# Patient Record
Sex: Male | Born: 1950 | Race: White | Hispanic: No | Marital: Married | Smoking: Never smoker
Health system: Southern US, Community
[De-identification: ages and names within clinical notes are randomized; demographics above are authoritative.]

## PROBLEM LIST (undated history)

## (undated) DIAGNOSIS — N201 Calculus of ureter: Secondary | ICD-10-CM

## (undated) DIAGNOSIS — R7301 Impaired fasting glucose: Secondary | ICD-10-CM

## (undated) DIAGNOSIS — J189 Pneumonia, unspecified organism: Secondary | ICD-10-CM

## (undated) DIAGNOSIS — I10 Essential (primary) hypertension: Secondary | ICD-10-CM

## (undated) DIAGNOSIS — Z91018 Allergy to other foods: Secondary | ICD-10-CM

## (undated) DIAGNOSIS — I4819 Other persistent atrial fibrillation: Secondary | ICD-10-CM

## (undated) HISTORY — PX: SHOULDER ARTHROSCOPY W/ ROTATOR CUFF REPAIR: SHX2400

## (undated) HISTORY — DX: Other persistent atrial fibrillation: I48.19

## (undated) HISTORY — DX: Impaired fasting glucose: R73.01

## (undated) HISTORY — PX: FRACTURE SURGERY: SHX138

## (undated) HISTORY — DX: Allergy to other foods: Z91.018

## (undated) HISTORY — PX: ORIF METACARPAL FRACTURE: SUR940

## (undated) HISTORY — DX: Calculus of ureter: N20.1

---

## 2003-06-07 ENCOUNTER — Ambulatory Visit (HOSPITAL_COMMUNITY): Admission: RE | Admit: 2003-06-07 | Discharge: 2003-06-07 | Payer: Self-pay | Admitting: Orthopedic Surgery

## 2003-07-31 ENCOUNTER — Ambulatory Visit (HOSPITAL_BASED_OUTPATIENT_CLINIC_OR_DEPARTMENT_OTHER): Admission: RE | Admit: 2003-07-31 | Discharge: 2003-07-31 | Payer: Self-pay | Admitting: Orthopedic Surgery

## 2003-07-31 ENCOUNTER — Ambulatory Visit (HOSPITAL_COMMUNITY): Admission: RE | Admit: 2003-07-31 | Discharge: 2003-07-31 | Payer: Self-pay | Admitting: Orthopedic Surgery

## 2003-08-08 ENCOUNTER — Ambulatory Visit (HOSPITAL_COMMUNITY): Admission: RE | Admit: 2003-08-08 | Discharge: 2003-08-08 | Payer: Self-pay | Admitting: Orthopedic Surgery

## 2005-10-03 ENCOUNTER — Emergency Department (HOSPITAL_COMMUNITY): Admission: EM | Admit: 2005-10-03 | Discharge: 2005-10-04 | Payer: Self-pay | Admitting: Emergency Medicine

## 2012-07-10 DIAGNOSIS — I4819 Other persistent atrial fibrillation: Secondary | ICD-10-CM

## 2012-07-10 HISTORY — DX: Other persistent atrial fibrillation: I48.19

## 2012-07-12 ENCOUNTER — Other Ambulatory Visit: Payer: Self-pay | Admitting: Cardiology

## 2012-07-14 ENCOUNTER — Ambulatory Visit (HOSPITAL_COMMUNITY): Payer: BC Managed Care – PPO | Admitting: Anesthesiology

## 2012-07-14 ENCOUNTER — Encounter (HOSPITAL_COMMUNITY)
Admission: RE | Disposition: A | Discharge status: Home / Self Care | Payer: Self-pay | Source: Ambulatory Visit | Attending: Cardiology

## 2012-07-14 ENCOUNTER — Encounter (HOSPITAL_COMMUNITY): Payer: Self-pay | Admitting: Anesthesiology

## 2012-07-14 ENCOUNTER — Encounter (HOSPITAL_COMMUNITY): Payer: Self-pay | Admitting: *Deleted

## 2012-07-14 ENCOUNTER — Ambulatory Visit (HOSPITAL_COMMUNITY)
Admission: RE | Admit: 2012-07-14 | Discharge: 2012-07-14 | Disposition: A | Discharge status: Home / Self Care | Payer: BC Managed Care – PPO | Source: Ambulatory Visit | Attending: Cardiology | Admitting: Cardiology

## 2012-07-14 DIAGNOSIS — I10 Essential (primary) hypertension: Secondary | ICD-10-CM | POA: Insufficient documentation

## 2012-07-14 DIAGNOSIS — I4891 Unspecified atrial fibrillation: Secondary | ICD-10-CM | POA: Diagnosis present

## 2012-07-14 HISTORY — DX: Pneumonia, unspecified organism: J18.9

## 2012-07-14 HISTORY — PX: CARDIOVERSION: SHX1299

## 2012-07-14 HISTORY — PX: TEE WITHOUT CARDIOVERSION: SHX5443

## 2012-07-14 HISTORY — DX: Essential (primary) hypertension: I10

## 2012-07-14 SURGERY — ECHOCARDIOGRAM, TRANSESOPHAGEAL
Anesthesia: General

## 2012-07-14 MED ORDER — MIDAZOLAM HCL 10 MG/2ML IJ SOLN
INTRAMUSCULAR | Status: DC | PRN
  Administered 2012-07-14 (×2): 2 mg via INTRAVENOUS
  Administered 2012-07-14: 1 mg via INTRAVENOUS

## 2012-07-14 MED ORDER — BUTAMBEN-TETRACAINE-BENZOCAINE 2-2-14 % EX AERO
INHALATION_SPRAY | CUTANEOUS | Status: DC | PRN
  Administered 2012-07-14: 2 via TOPICAL

## 2012-07-14 MED ORDER — PROPOFOL 10 MG/ML IV BOLUS
INTRAVENOUS | Status: DC | PRN
  Administered 2012-07-14: 70 mg via INTRAVENOUS

## 2012-07-14 MED ORDER — FENTANYL CITRATE 0.05 MG/ML IJ SOLN
INTRAMUSCULAR | Status: AC
  Filled 2012-07-14: qty 2

## 2012-07-14 MED ORDER — FENTANYL CITRATE 0.05 MG/ML IJ SOLN
INTRAMUSCULAR | Status: DC | PRN
  Administered 2012-07-14: 25 ug via INTRAVENOUS
  Administered 2012-07-14: 10 ug via INTRAVENOUS
  Administered 2012-07-14: 25 ug via INTRAVENOUS

## 2012-07-14 MED ORDER — LIDOCAINE HCL (CARDIAC) 20 MG/ML IV SOLN
INTRAVENOUS | Status: DC | PRN
  Administered 2012-07-14: 20 mg via INTRAVENOUS

## 2012-07-14 MED ORDER — LIDOCAINE VISCOUS 2 % MT SOLN
OROMUCOSAL | Status: AC
  Filled 2012-07-14: qty 15

## 2012-07-14 MED ORDER — SODIUM CHLORIDE 0.9 % IV SOLN
INTRAVENOUS | Status: DC
  Administered 2012-07-14: 500 mL via INTRAVENOUS

## 2012-07-14 MED ORDER — MIDAZOLAM HCL 5 MG/ML IJ SOLN
INTRAMUSCULAR | Status: AC
  Filled 2012-07-14: qty 2

## 2012-07-14 NOTE — CV Procedure (Signed)
Electrical Cardioversion Procedure Note Reginald [name] [id number] 1950-08-10  Procedure: Electrical Cardioversion Indications:  Atrial Fibrillation  Time Out: Verified patient identification, verified procedure,medications/allergies/relevent history reviewed, required imaging and test results available.  Performed  Procedure Details  The patient was NPO after midnight. Anesthesia was administered at the beside  by Dr.Joselin with 70mg  of propofol.  Cardioversion was performed with synchronized biphasic defibrillation via AP pads with 150 joules.  1 attempt(s) were performed.  The patient converted to normal sinus rhythm. The patient tolerated the procedure well   TEE  - normal EF  - LA 5.1cm  - moderate MR  - No thrombus  IMPRESSION:  Successful cardioversion of atrial fibrillation Moderate MR Dilated LA 5.1cm Normal EF.  Continue apixiban for 3 weeks. I will see in follow up.    Reginald [name] 07/14/2012, 10:50 AM

## 2012-07-14 NOTE — Anesthesia Preprocedure Evaluation (Addendum)
Anesthesia Evaluation  Patient identified by MRN, date of birth, ID band Patient awake    Reviewed: Allergy & Precautions, H&P , NPO status , Patient's Chart, lab work & pertinent test results, reviewed documented beta blocker date and time   Airway Mallampati: I      Dental  (+) Teeth Intact   Pulmonary asthma ,  breath sounds clear to auscultation        Cardiovascular hypertension, Pt. on medications + dysrhythmias Atrial Fibrillation Rhythm:Irregular     Neuro/Psych    GI/Hepatic   Endo/Other    Renal/GU      Musculoskeletal   Abdominal   Peds  Hematology   Anesthesia Other Findings   Reproductive/Obstetrics                         Anesthesia Physical Anesthesia Plan  ASA: III  Anesthesia Plan: General   Post-op Pain Management:    Induction: Intravenous  Airway Management Planned: Mask  Additional Equipment:   Intra-op Plan:   Post-operative Plan:   Informed Consent:   Dental advisory given  Plan Discussed with: CRNA and Anesthesiologist  Anesthesia Plan Comments:        Anesthesia Quick Evaluation

## 2012-07-14 NOTE — H&P (View-Only) (Signed)
Patient: Reginald [name], Reginald [name] Account Number: [id number] Provider: Donato Schultz, MD  DOB: May 17, 1950 Age: 62 Y Sex: Male Date: 07/13/2012  Phone: [phone]   Address: [address] [place], [id number]  Pcp: JAMES OSBORNE          1. 2 DAY FOLLOWUP/SEE LINDA.        HPI:  General:  62 year old with hyperlipidemia who is being evaluated for new onset atrial fibrillation. Aspirin was started. Metoprolol tartrate 25 mg twice a day was started. Lab work including TSH, d-dimer, CBC were initiated. EKG, personally reviewed demonstrates heart rate of 147, atrial fibrillation, no ST segment changes. Was [place] the beach playing golf figure 8, felt palpitations on hole 8. Felt a little off. Stopped at 10. Was playing with Dr. Hart [place] and Briant Cedar. He palpated and noted irregularity. Last night while laying down felt some mild chest pressure upper chest, mid. No real SOB, no syncope. No fevers, no cough. No thyroid issues. No weight loss. No recent surgery. He did consume alcohol over weekend, more than usual. No stroke history, no DM. No bleeding. He has not felt any further chest discomfort or pressure. His heart rate is still in atrial fibrillation and return clinic visit today in preparation for cardioversion. We had a lengthy discussion today about further testing, stress testing. If his symptoms return or continue/chest tightness that is, we will certainly pursue nuclear stress test. My first step is to perform TEE cardioversion..        ROS:  No bleeding, no syncope, no orthopnea, no PND.       Medical History: Hypertension dx 2007, Impaired fasting glucose dx 2007, Asthmatic bronchitis 2000, Pneumonia 1967, Duputryn's contracture left palm, Ureteral stone - 1980s.        Social History:  General:  History of smoking  cigarettes: Never smoked Alcohol: Daily.  Exercise: some weights; less aerobic exercise.  Occupation: Owns a Electrical engineer company;blue polka dots on truck.   Marital Status: Married.        Medications: Taking MVI as directed Once a day, Taking Vitamin C 1000 MG Tablet as directed Once a day, Taking Glucosamine 500 MG Capsule 1 capsule Three times a day, Taking Diovan HCT 320-25 MG Tablet TAKE ONE TABLET BY MOUTH DAILY FOR BLOOD PRESSURE , Taking Metoprolol Tartrate 25 MG Tablet 1 tablet Twice a day, Taking Eliquis 5 mg 5 Tablet 1 tablet twice a day, Medication List reviewed and reconciled with the patient       Allergies: Lisinopril: cough.           Vitals: Wt 257, Wt change 2 lb, Ht 72, BMI 34.85, Pulse sitting 88, BP sitting 100/70.       Examination:  Cardiology, General:  GENERAL APPEARANCE: pleasant, NAD.  HEENT: unremarkable.  CAROTID UPSTROKE: normal, no bruit.  JVD: flat.  HEART SOUNDS: irregularly irregular, tachycardic.  MURMUR: absent.  LUNGS: no rales or wheezes.  EXTREMITIES: no leg edema.            Assessment:  1. Atrial fibrillation - 427.31 (Primary)        1. Atrial fibrillation  Notes: Continue with current anticoagulation, apixiban.  Planned cardioversion tomorrow. Once again discussed. I will not increase metoprolol to day. May have bradycardia post procedure. blood pressure slightly low. May be secondary to metoprolol.        Follow Up: 1 Week post cardioversion      Provider: Donato Schultz, MD  Patient: Reginald [name], Reginald [name] DOB: [birth year]  Date: 07/13/2012

## 2012-07-14 NOTE — Interval H&P Note (Signed)
History and Physical Interval Note:  07/14/2012 10:03 AM  Reginald [name]  has presented today for surgery, with the diagnosis of a-fib  The various methods of treatment have been discussed with the patient and family. After consideration of risks, benefits and other options for treatment, the patient has consented to  Procedure(s): TRANSESOPHAGEAL ECHOCARDIOGRAM (TEE) (N/A) CARDIOVERSION (N/A) as a surgical intervention .  The patient's history has been reviewed, patient examined, no change in status, stable for surgery.  I have reviewed the patient's chart and labs.  Questions were answered to the patient's satisfaction.     SKAINS, MARK

## 2012-07-14 NOTE — Anesthesia Postprocedure Evaluation (Signed)
  Anesthesia Post-op Note  Patient: Reginald [name]  Procedure(s) Performed: Procedure(s): TRANSESOPHAGEAL ECHOCARDIOGRAM (TEE) (N/A) CARDIOVERSION (N/A)  Patient Location: PACU and Endoscopy Unit  Anesthesia Type:General  Level of Consciousness: awake, alert  and oriented  Airway and Oxygen Therapy: Patient Spontanous Breathing and Patient connected to nasal cannula oxygen  Post-op Pain: none  Post-op Assessment: Post-op Vital signs reviewed, Patient's Cardiovascular Status Stable, Respiratory Function Stable and Patent Airway  Post-op Vital Signs: stable  Complications: No apparent anesthesia complications

## 2012-07-14 NOTE — Preoperative (Signed)
Beta Blockers   Reason not to administer Beta Blockers:Not Applicable 

## 2012-07-14 NOTE — H&P (Signed)
  Patient: Reginald [name] Account Number: [id number] Provider: Breyson Kelm, MD  DOB: 09/11/1950 Age: 62 Y Sex: Male Date: 07/13/2012  Phone: [phone]   Address: [address], [place], [place]  Pcp: JAMES OSBORNE          1. 2 DAY FOLLOWUP/SEE LINDA.        HPI:  General:  62-year-old with hyperlipidemia who is being evaluated for new onset atrial fibrillation. Aspirin was started. Metoprolol tartrate 25 mg twice a day was started. Lab work including TSH, d-dimer, CBC were initiated. EKG, personally reviewed demonstrates heart rate of 147, atrial fibrillation, no ST segment changes. Was at the beach playing golf figure 8, felt palpitations on hole 8. Felt a little off. Stopped at 10. Was playing with Dr. Lawson and [place]. He palpated and noted irregularity. Last night while laying down felt some mild chest pressure upper chest, mid. No real SOB, no syncope. No fevers, no cough. No thyroid issues. No weight loss. No recent surgery. He did consume alcohol over weekend, more than usual. No stroke history, no DM. No bleeding. He has not felt any further chest discomfort or pressure. His heart rate is still in atrial fibrillation and return clinic visit today in preparation for cardioversion. We had a lengthy discussion today about further testing, stress testing. If his symptoms return or continue/chest tightness that is, we will certainly pursue nuclear stress test. My first step is to perform TEE cardioversion..        ROS:  No bleeding, no syncope, no orthopnea, no PND.       Medical History: Hypertension dx 2007, Impaired fasting glucose dx 2007, Asthmatic bronchitis 2000, Pneumonia 1967, Duputryn's contracture left palm, Ureteral stone - 1980s.        Social History:  General:  History of smoking  cigarettes: Never smoked Alcohol: Daily.  Exercise: some weights; less aerobic exercise.  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   Marital Status: Married.        Medications: Taking MVI as directed Once a day, Taking Vitamin C 1000 MG Tablet as directed Once a day, Taking Glucosamine 500 MG Capsule 1 capsule Three times a day, Taking Diovan HCT 320-25 MG Tablet TAKE ONE TABLET BY MOUTH DAILY FOR BLOOD PRESSURE , Taking Metoprolol Tartrate 25 MG Tablet 1 tablet Twice a day, Taking Eliquis 5 mg 5 Tablet 1 tablet twice a day, Medication List reviewed and reconciled with the patient       Allergies: Lisinopril: cough.           Vitals: Wt 257, Wt change 2 lb, Ht 72, BMI 34.85, Pulse sitting 88, BP sitting 100/70.       Examination:  Cardiology, General:  GENERAL APPEARANCE: pleasant, NAD.  HEENT: unremarkable.  CAROTID UPSTROKE: normal, no bruit.  JVD: flat.  HEART SOUNDS: irregularly irregular, tachycardic.  MURMUR: absent.  LUNGS: no rales or wheezes.  EXTREMITIES: no leg edema.            Assessment:  1. Atrial fibrillation - 427.31 (Primary)        1. Atrial fibrillation  Notes: Continue with current anticoagulation, apixiban.  Planned cardioversion tomorrow. Once again discussed. I will not increase metoprolol to day. May have bradycardia post procedure. blood pressure slightly low. May be secondary to metoprolol.        Follow Up: 1 Week post cardioversion      Provider: Yzabelle Calles, MD  Patient: Reginald [name] DOB: 01/29/1951   Date: 07/13/2012       

## 2012-07-14 NOTE — Transfer of Care (Signed)
Immediate Anesthesia Transfer of Care Note  Patient: Reginald [name]  Procedure(s) Performed: Procedure(s): TRANSESOPHAGEAL ECHOCARDIOGRAM (TEE) (N/A) CARDIOVERSION (N/A)  Patient Location: PACU and Endoscopy Unit  Anesthesia Type:General  Level of Consciousness: awake, alert  and oriented  Airway & Oxygen Therapy: Patient Spontanous Breathing and Patient connected to nasal cannula oxygen  Post-op Assessment: Report given to PACU RN, Post -op Vital signs reviewed and stable and Patient moving all extremities X 4  Post vital signs: Reviewed and stable  Complications: No apparent anesthesia complications

## 2012-07-14 NOTE — Progress Notes (Signed)
Echocardiogram Echocardiogram Transesophageal has been performed.  Reginald [name] 07/14/2012, 10:48 AM

## 2012-07-15 ENCOUNTER — Encounter (HOSPITAL_COMMUNITY): Payer: Self-pay | Admitting: Cardiology

## 2012-09-13 ENCOUNTER — Other Ambulatory Visit: Payer: Self-pay | Admitting: Cardiology

## 2012-09-15 NOTE — H&P (Signed)
Patient: Reginald [name], Reginald [name] Account Number: [id number] Provider: Donato Schultz, MD  DOB: 04/02/1951   Age: 62 Y   Sex: Male Date: 09/12/2012  Phone: [phone]  Address: [address] [place], [id number]  Pcp: JAMES OSBORNE    --------------------------------------------------------------------------------       1. MS/walk in/"back in afib".      HPI:     General:           [name] is a 62 year old male with recent TEE cardioversion 07/14/12 after new onset/newly discovered atrial fibrillation who previously had throat tightness/chest tightness, mild shortness of breath here for followup. Quite anxious about his current condition. He is a Occupational hygienist. He is currently wearing a wrist heart rate monitor. He's had several friends who have had recent heart issues. One went to bypass surgery recently.         He was on Metoprolol tartrate 25 mg twice a day which was changed to Dilt 120 CD (possible fatigue with metoprolol). EKG was stable in NSR. (Prior EKG, demonstrates AF with [phone]) TEE cardioversion was successful. TEE with noted Moderate mitral regurgitation, Left atrial dimension of 5.1 cm but TTE with LA size 4.1 and 1.58cm LVH (mod) and mitral regurgitation only appeared mild.                   Was on Eliquis for one month post CV. Has caused him alot of anxiety and he is fearful of reoccurance. He felt an occasion skip beat yesterday, about every 20th beat and he wanted to make sure he was OK, and he had alot of questions on what he could do to help prevent A fib. He is walking 3 miles a day without difficulty nor chest pain or palpitations. Occasionally he will feel mild chest soreness if he bends over or moves like lifting arms over head, but not related to exertion and this is very fleeting.                  On September 12, 2012, unfortunately he is back in atrial fibrillation. He believes that this occurred on Saturday while playing golf on the 10th whole. He felt mild dizziness but no  chest pain or pressure. Mild diaphoresis associated with this. He called on call physician who told him to take an extra diltiazem. He also took Eliquis last evening. We started on Multaq (secondary to LVH) and set him up for a TEE cardioversion Friday..      ROS:      No fevers, no cough, no syncope, no chest pain, shortness of breath.     Medical History: Hypertension dx 2007, Impaired fasting glucose dx 2007, Asthmatic bronchitis 2000, Pneumonia 1967, Duputryn's contracture left palm, Ureteral stone - 1980s.      Medications: Taking MVI 1 tablet Once a day, Taking Vitamin C 1000 MG Tablet as directed Once a day, Taking Glucosamine 500 MG Capsule 1 capsule Three times a day, Taking Dilt-CD 120 MG Capsule Extended Release 24 Hour 1 capsule twice a day, Taking Diovan HCT 320-25 MG Tablet TAKE ONE TABLET BY MOUTH DAILY FOR BLOOD PRESSURE , Taking Eliquis 5 mg Tablet 1 tablet twice a day, Medication List reviewed and reconciled with the patient     Allergies: Lisinopril: cough.        Vitals: Wt 251, Wt change -2 lb, Ht 72, BMI 34.04, Pulse sitting 116, BP sitting 102/70.     Examination:     Cardiology, General:  GENERAL APPEARANCE: pleasant, NAD.         HEENT: unremarkable.         CAROTID UPSTROKE: normal, no bruit.         JVD: flat.         HEART SOUNDS: irregularly irregular, mildly tachycardic.         MURMUR: absent.         LUNGS: no rales or wheezes.         EXTREMITIES: no leg edema.         PERIPHERAL PULSES: 2 plus bilateral.               Assessment:  1. Atrial fibrillation - 427.31 (Primary)   2. Essential hypertension, benign - 401.1   3. Anticoagulant monitoring - V58.61   4. Dizziness - 780.4       1. Atrial fibrillation  Refill Eliquis 5 mg Tablet, 5, 1 tablet, orally, twice a day, 30 days, 60, Refills 12 ;  Start Multaq tablet, 400 mg, 1 tablet, orally, daily, 30 days, 60, Refills 12 .         LAB: PT (Prothrombin Time) ([id number])      LAB: Basic  Metabolic      LAB: CBC with Diff      Imaging: EKG  AFIB RVR, 118            [name],[name] 09/12/2012 09:40:31 AM > SKAINS,MARK 09/12/2012 09:57:42 AM > Prior ECG NSR.   Notes: Discussed possible triggers of atrial fibrillation once again including alcohol, sleep deprivation. Initiating antiarrhythmic. Discussed the possibility of future ablative therapy. resume anticoagulation. Discussed TEE cardioversion. Willing to proceed. Questions answered.       2. Essential hypertension, benign  Notes: Blood pressure currently under good control. No changes made.       3. Anticoagulant monitoring  Notes: Received anticoagulation once again. I have written another prescription. Prior creatinine within normal range.       4. Dizziness  Notes: One of his symptoms with atrial fibrillation is dizziness. When he gets up from a seated position he feels mildly lightheaded.          Procedures:      Venipuncture:          Venipuncture: Gasanova,Svetlana 09/12/2012 11:35:14 AM > , performed in right arm.             Labs:        Lab: PT (Prothrombin Time) ([id number])  normal       Prothrombin Time 11.5  9.1-12.0 - SEC       INR 1.1  0.8-1.2 -                  SKAINS,MARK 09/13/2012 07:15:37 AM >               Lab: Basic Metabolic  Creat 0.94, K 4.2       GLUCOSE 98  70-99 - mg/dL       BUN 14  0-86 - mg/dL       CREATININE 5.78  0.60-1.30 - mg/dl       eGFR (NON-AFRICAN AMERICAN) 81  >60 - calc       eGFR (AFRICAN AMERICAN) 99  >60 - calc       SODIUM 138  136-145 - mmol/L       POTASSIUM 4.2  3.5-5.5 - mmol/L       CHLORIDE 101  98-107 - mmol/L  C02 29  22-32 - mmol/L       ANION GAP 12.0  6.0-20.0 - mmol/L       CALCIUM 9.7  8.6-10.3 - mg/dL                 [facility] 09/12/2012 04:30:44 PM > ok for TEE/CV               Lab: CBC with Diff  hemoglobin 16.8       WBC 8.9  4.0-11.0 - K/ul       RBC 5.42  4.20-5.80 - M/uL       HGB 16.8  13.0-17.0 - g/dL       HCT 56.2  13.0-86.5  - %       MCH 31.0  27.0-33.0 - pg       MPV 8.8  7.5-10.7 - fL       MCV 90.1  80.0-94.0 - fL       MCHC 34.4  32.0-36.0 - g/dL       RDW 78.4  69.6-29.5 - %       NRBC# 0.01  -        PLT 174  150-400 - K/uL       NEUT % 67.0  43.3-71.9 - %       NRBC% 0.10  - %       LYMPH% 21.6  16.8-43.5 - %       MONO % 7.8  4.6-12.4 - %       EOS % 2.6  0.0-7.8 - %       BASO % 1.0  0.0-1.0 - %       NEUT # 6.0  1.9-7.2 - K/uL       LYMPH# 1.90  1.10-2.70 - K/uL       MONO # 0.7  0.3-0.8 - K/uL       EOS # 0.2  0.0-0.6 - K/uL       BASO # 0.1  0.0-0.1 - K/uL                 [facility] 09/12/2012 01:38:57 PM > okay for cardioversion           Procedure Codes: 28413 EKG I AND R, 80048 ECL BMP, 85025 ECL CBC PLATELET DIFF, 24401 BLOOD COLLECTION ROUTINE VENIPUNCTURE     Follow Up: post conversion         Provider: Donato Schultz, MD  Patient: Reginald [name], Reginald [name]  DOB: 06/12/1950  Date: 09/12/2012

## 2012-09-16 ENCOUNTER — Encounter (HOSPITAL_COMMUNITY)
Admission: RE | Disposition: A | Discharge status: Home / Self Care | Payer: Self-pay | Source: Ambulatory Visit | Attending: Cardiology

## 2012-09-16 ENCOUNTER — Ambulatory Visit (HOSPITAL_COMMUNITY)
Admission: RE | Admit: 2012-09-16 | Discharge: 2012-09-16 | Disposition: A | Discharge status: Home / Self Care | Payer: BC Managed Care – PPO | Source: Ambulatory Visit | Attending: Cardiology | Admitting: Cardiology

## 2012-09-16 ENCOUNTER — Encounter (HOSPITAL_COMMUNITY): Payer: Self-pay | Admitting: Certified Registered Nurse Anesthetist

## 2012-09-16 ENCOUNTER — Ambulatory Visit (HOSPITAL_COMMUNITY): Payer: BC Managed Care – PPO | Admitting: Certified Registered Nurse Anesthetist

## 2012-09-16 ENCOUNTER — Encounter (HOSPITAL_COMMUNITY): Payer: Self-pay | Admitting: *Deleted

## 2012-09-16 DIAGNOSIS — Z79899 Other long term (current) drug therapy: Secondary | ICD-10-CM | POA: Insufficient documentation

## 2012-09-16 DIAGNOSIS — R7301 Impaired fasting glucose: Secondary | ICD-10-CM | POA: Insufficient documentation

## 2012-09-16 DIAGNOSIS — I517 Cardiomegaly: Secondary | ICD-10-CM | POA: Insufficient documentation

## 2012-09-16 DIAGNOSIS — Z7901 Long term (current) use of anticoagulants: Secondary | ICD-10-CM | POA: Insufficient documentation

## 2012-09-16 DIAGNOSIS — F411 Generalized anxiety disorder: Secondary | ICD-10-CM | POA: Insufficient documentation

## 2012-09-16 DIAGNOSIS — I10 Essential (primary) hypertension: Secondary | ICD-10-CM | POA: Insufficient documentation

## 2012-09-16 DIAGNOSIS — I4891 Unspecified atrial fibrillation: Secondary | ICD-10-CM | POA: Diagnosis present

## 2012-09-16 DIAGNOSIS — J45909 Unspecified asthma, uncomplicated: Secondary | ICD-10-CM | POA: Insufficient documentation

## 2012-09-16 DIAGNOSIS — I059 Rheumatic mitral valve disease, unspecified: Secondary | ICD-10-CM | POA: Insufficient documentation

## 2012-09-16 HISTORY — PX: TEE WITHOUT CARDIOVERSION: SHX5443

## 2012-09-16 HISTORY — PX: CARDIOVERSION: SHX1299

## 2012-09-16 SURGERY — ECHOCARDIOGRAM, TRANSESOPHAGEAL
Anesthesia: General

## 2012-09-16 MED ORDER — LIDOCAINE VISCOUS 2 % MT SOLN
OROMUCOSAL | Status: AC
  Filled 2012-09-16: qty 15

## 2012-09-16 MED ORDER — FENTANYL CITRATE 0.05 MG/ML IJ SOLN
INTRAMUSCULAR | Status: AC
  Filled 2012-09-16: qty 4

## 2012-09-16 MED ORDER — HYDROCORTISONE 1 % EX CREA
1.0000 "application " | TOPICAL_CREAM | Freq: Two times a day (BID) | CUTANEOUS | Status: DC
  Filled 2012-09-16: qty 28

## 2012-09-16 MED ORDER — FENTANYL CITRATE 0.05 MG/ML IJ SOLN
INTRAMUSCULAR | Status: DC | PRN
  Administered 2012-09-16 (×2): 25 ug via INTRAVENOUS

## 2012-09-16 MED ORDER — MIDAZOLAM HCL 5 MG/ML IJ SOLN
INTRAMUSCULAR | Status: AC
  Filled 2012-09-16: qty 2

## 2012-09-16 MED ORDER — MIDAZOLAM HCL 10 MG/2ML IJ SOLN
INTRAMUSCULAR | Status: DC | PRN
  Administered 2012-09-16 (×2): 2 mg via INTRAVENOUS

## 2012-09-16 MED ORDER — PROPOFOL 10 MG/ML IV BOLUS
INTRAVENOUS | Status: DC | PRN
  Administered 2012-09-16: 60 mg via INTRAVENOUS

## 2012-09-16 MED ORDER — DIPHENHYDRAMINE HCL 50 MG/ML IJ SOLN
INTRAMUSCULAR | Status: AC
  Filled 2012-09-16: qty 1

## 2012-09-16 MED ORDER — SODIUM CHLORIDE 0.9 % IV SOLN
INTRAVENOUS | Status: DC
  Administered 2012-09-16: 500 mL via INTRAVENOUS

## 2012-09-16 MED ORDER — HYDROCORTISONE 1 % EX CREA: 1.0000 "application " | TOPICAL_CREAM | Freq: Two times a day (BID) | CUTANEOUS | Status: AC

## 2012-09-16 MED ORDER — LIDOCAINE VISCOUS 2 % MT SOLN
OROMUCOSAL | Status: DC | PRN
  Administered 2012-09-16: 10 mL via OROMUCOSAL

## 2012-09-16 NOTE — CV Procedure (Signed)
Electrical Cardioversion Procedure Note AZAD CALAME [id number] 1951-03-03  Procedure: Electrical Cardioversion Indications:  AFIB  Time Out: Verified patient identification, verified procedure,medications/allergies/relevent history reviewed, required imaging and test results available.  TIME OUT performed.   Procedure Details  The patient was NPO after midnight. Anesthesia (Dr. Katrinka Blazing) was administered at the beside 60mg  of propofol.  Cardioversion was performed with synchronized biphasic defibrillation via AP pads with 150 joules.  1 attempt(s) were performed.  The patient converted to normal sinus rhythm. The patient tolerated the procedure well   IMPRESSION:  Successful cardioversion of atrial fibrillation. Mild chest wall burn noted post conversion.  TEE with mild to moderate MR.  Normal EF No LAA thrombus  This is his second cardioversion. Eliquis 5mg  BID. Multaq 400mg  BID.   Will provide cortisone cream to chest wall.   Will see in follow up.   Khamora Karan 09/16/2012, 9:09 AM

## 2012-09-16 NOTE — Preoperative (Signed)
Beta Blockers   Reason not to administer Beta Blockers:Not Applicable, Pt took Metoprolol this am 

## 2012-09-16 NOTE — Interval H&P Note (Signed)
History and Physical Interval Note:  09/16/2012 8:43 AM  Reginald [name]  has presented today for surgery, with the diagnosis of a fib   The various methods of treatment have been discussed with the patient and family. After consideration of risks, benefits and other options for treatment, the patient has consented to  Procedure(s): TRANSESOPHAGEAL ECHOCARDIOGRAM (TEE) (N/A) CARDIOVERSION (N/A) as a surgical intervention .  The patient's history has been reviewed, patient examined, no change in status, stable for surgery.  I have reviewed the patient's chart and labs.  Questions were answered to the patient's satisfaction.     Larene Ascencio

## 2012-09-16 NOTE — H&P (View-Only) (Signed)
  Patient: Reginald [name], Reginald [name] Account Number: [id number] Provider: Fines Kimberlin, MD  DOB: 03/09/1951   Age: 61 Y   Sex: Male Date: 09/12/2012  Phone: [phone]  Address: [address], [place], [place]  Pcp: JAMES OSBORNE    --------------------------------------------------------------------------------       1. MS/walk in/"back in afib".      HPI:     General:           Reginald [name] Reginald [name] is a 61-year-old male with recent TEE cardioversion 07/14/12 after new onset/newly discovered atrial fibrillation who previously had throat tightness/chest tightness, mild shortness of breath here for followup. Quite anxious about his current condition. 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 He's had several friends who have had recent heart issues. One went to bypass surgery recently.         He was on Metoprolol tartrate 25 mg twice a Reginald which was changed to Dilt 120 CD (possible fatigue with metoprolol). EKG was stable in NSR. (Prior EKG, demonstrates AF with rate147) TEE cardioversion was successful. TEE with noted Moderate mitral regurgitation, Left atrial dimension of 5.1 cm but TTE with LA size 4.1 and 1.58cm LVH (mod) and mitral regurgitation only appeared mild.                   Was on Eliquis for one month post CV. Has caused him alot of anxiety and he is fearful of reoccurance. He felt an occasion skip beat yesterday, about every 20th beat and he wanted to make sure he was OK, and he had alot of questions on what he could do to help prevent A fib. He is walking 3 miles a Reginald without difficulty nor chest pain or palpitations. Occasionally he will feel mild chest soreness if he bends over or moves like lifting arms over head, but not related to exertion and this is very fleeting.                  On September 12, 2012, unfortunately he is back in atrial fibrillation. He believes that this occurred on Saturday while playing golf on the 10th whole. He felt mild dizziness but no  chest pain or pressure. Mild diaphoresis associated with this. He called on call physician who told him to take an extra diltiazem. He also took Eliquis last evening. We started on Multaq (secondary to LVH) and set him up for a TEE cardioversion Friday..      ROS:      No fevers, no cough, no syncope, no chest pain, shortness of breath.     Medical History: Hypertension dx 2007, Impaired fasting glucose dx 2007, Asthmatic bronchitis 2000, Pneumonia 1967, Duputryn's contracture left palm, Ureteral stone - 1980s.      Medications: Taking MVI 1 tablet Once a Reginald, Taking Vitamin C 1000 MG Tablet as directed Once a Reginald, Taking Glucosamine 500 MG Capsule 1 capsule Three times a Reginald, Taking Dilt-CD 120 MG Capsule Extended Release 24 Hour 1 capsule twice a Reginald, Taking Diovan HCT 320-25 MG Tablet TAKE ONE TABLET BY MOUTH DAILY FOR BLOOD PRESSURE , Taking Eliquis 5 mg Tablet 1 tablet twice a Reginald, Medication List reviewed and reconciled with the patient     Allergies: Lisinopril: cough.        Vitals: Wt 251, Wt change -2 lb, Ht 72, BMI 34.04, Pulse sitting 116, BP sitting 102/70.     Examination:     Cardiology, General:           GENERAL APPEARANCE: pleasant, NAD.         HEENT: unremarkable.         CAROTID UPSTROKE: normal, no bruit.         JVD: flat.         HEART SOUNDS: irregularly irregular, mildly tachycardic.         MURMUR: absent.         LUNGS: no rales or wheezes.         EXTREMITIES: no leg edema.         PERIPHERAL PULSES: 2 plus bilateral.               Assessment:  1. Atrial fibrillation - 427.31 (Primary)   2. Essential hypertension, benign - 401.1   3. Anticoagulant monitoring - V58.61   4. Dizziness - 780.4       1. Atrial fibrillation  Refill Eliquis 5 mg Tablet, 5, 1 tablet, orally, twice a Reginald, 30 days, 60, Refills 12 ;  Start Multaq tablet, 400 mg, 1 tablet, orally, daily, 30 days, 60, Refills 12 .         LAB: PT (Prothrombin Time) (005199)      LAB: Basic  Metabolic      LAB: CBC with Diff      Imaging: EKG  AFIB RVR, 118            Reginald [name],Reginald [name] 09/12/2012 09:40:31 AM > Reginald [name] 09/12/2012 09:57:42 AM > Prior ECG NSR.   Notes: Discussed possible triggers of atrial fibrillation once again including alcohol, sleep deprivation. Initiating antiarrhythmic. Discussed the possibility of future ablative therapy. resume anticoagulation. Discussed TEE cardioversion. Willing to proceed. Questions answered.       2. Essential hypertension, benign  Notes: Blood pressure currently under good control. No changes made.       3. Anticoagulant monitoring  Notes: Received anticoagulation once again. I have written another prescription. Prior creatinine within normal range.       4. Dizziness  Notes: One of his symptoms with atrial fibrillation is dizziness. When he gets up from a seated position he feels mildly lightheaded.          Procedures:      Venipuncture:          Venipuncture: Reginald [name],Reginald [name] 09/12/2012 11:35:14 AM > , performed in right arm.             Labs:        Lab: PT (Prothrombin Time) (005199)  normal       Prothrombin Time 11.5  9.1-12.0 - SEC       INR 1.1  0.8-1.2 -                  Reginald [name] 09/13/2012 07:15:37 AM >               Lab: Basic Metabolic  Creat 0.94, K 4.2       GLUCOSE 98  70-99 - mg/dL       BUN 14  6-26 - mg/dL       CREATININE 0.94  0.60-1.30 - mg/dl       eGFR (NON-AFRICAN AMERICAN) 81  >60 - calc       eGFR (AFRICAN AMERICAN) 99  >60 - calc       SODIUM 138  136-145 - mmol/[name]       POTASSIUM 4.2  3.5-5.5 - mmol/[name]       CHLORIDE 101  98-107 - mmol/[name]         C02 29  22-32 - mmol/[name]       ANION GAP 12.0  6.0-20.0 - mmol/[name]       CALCIUM 9.7  8.6-10.3 - mg/dL                 Reginald [name] 09/12/2012 04:30:44 PM > ok for TEE/CV               Lab: CBC with Diff  hemoglobin 16.8       WBC 8.9  4.0-11.0 - K/ul       RBC 5.42  4.20-5.80 - M/uL       HGB 16.8  13.0-17.0 - g/dL       HCT 48.8  39.0-52.0  - %       MCH 31.0  27.0-33.0 - pg       MPV 8.8  7.5-10.7 - fL       MCV 90.1  80.0-94.0 - fL       MCHC 34.4  32.0-36.0 - g/dL       RDW 13.0  11.5-15.5 - %       NRBC# 0.01  -        PLT 174  150-400 - K/uL       NEUT % 67.0  43.3-71.9 - %       NRBC% 0.10  - %       LYMPH% 21.6  16.8-43.5 - %       MONO % 7.8  4.6-12.4 - %       EOS % 2.6  0.0-7.8 - %       BASO % 1.0  0.0-1.0 - %       NEUT # 6.0  1.9-7.2 - K/uL       LYMPH# 1.90  1.10-2.70 - K/uL       MONO # 0.7  0.3-0.8 - K/uL       EOS # 0.2  0.0-0.6 - K/uL       BASO # 0.1  0.0-0.1 - K/uL                 Reginald [name] 09/12/2012 01:38:57 PM > okay for cardioversion           Procedure Codes: 93000 EKG I AND R, 80048 ECL BMP, 85025 ECL CBC PLATELET DIFF, 36415 BLOOD COLLECTION ROUTINE VENIPUNCTURE     Follow Up: post conversion         Provider: Tyneka Scafidi, MD  Patient: Santosuosso, Mattox [name]  DOB: 06/13/1950  Date: 09/12/2012     

## 2012-09-16 NOTE — Progress Notes (Signed)
  Echocardiogram Echocardiogram Transesophageal has been performed.  Reginald [name] 09/16/2012, 9:12 AM

## 2012-09-16 NOTE — Anesthesia Preprocedure Evaluation (Addendum)
Anesthesia Evaluation  Patient identified by MRN, date of birth, ID band Patient awake    Reviewed: Allergy & Precautions, H&P , NPO status , Patient's Chart, lab work & pertinent test results  Airway Mallampati: II TM Distance: >3 FB Neck ROM: Full    Dental  (+) Dental Advisory Given   Pulmonary asthma , pneumonia -, resolved,          Cardiovascular hypertension, Pt. on medications and Pt. on home beta blockers + dysrhythmias Atrial Fibrillation     Neuro/Psych    GI/Hepatic   Endo/Other    Renal/GU      Musculoskeletal   Abdominal   Peds  Hematology   Anesthesia Other Findings   Reproductive/Obstetrics                          Anesthesia Physical Anesthesia Plan  ASA: III  Anesthesia Plan: General   Post-op Pain Management:    Induction: Intravenous  Airway Management Planned: Mask  Additional Equipment:   Intra-op Plan:   Post-operative Plan:   Informed Consent: I have reviewed the patients History and Physical, chart, labs and discussed the procedure including the risks, benefits and alternatives for the proposed anesthesia with the patient or authorized representative who has indicated his/her understanding and acceptance.   Dental advisory given  Plan Discussed with: CRNA, Anesthesiologist and Surgeon  Anesthesia Plan Comments:        Anesthesia Quick Evaluation

## 2012-09-16 NOTE — Anesthesia Postprocedure Evaluation (Signed)
  Anesthesia Post-op Note  Patient: Reginald [name]  Procedure(s) Performed: Procedure(s): TRANSESOPHAGEAL ECHOCARDIOGRAM (TEE) (N/A) CARDIOVERSION (N/A)  Patient Location: Endoscopy Unit  Anesthesia Type:General  Level of Consciousness: awake, alert , oriented and patient cooperative  Airway and Oxygen Therapy: Patient Spontanous Breathing and Patient connected to nasal cannula oxygen  Post-op Pain: none  Post-op Assessment: Post-op Vital signs reviewed, Patient's Cardiovascular Status Stable, Respiratory Function Stable, Patent Airway, No signs of Nausea or vomiting, Pain level controlled, No headache, No backache, No residual numbness and No residual motor weakness  Post-op Vital Signs: Reviewed and stable  Complications: No apparent anesthesia complications

## 2012-09-16 NOTE — Transfer of Care (Signed)
Immediate Anesthesia Transfer of Care Note  Patient: Reginald [name]  Procedure(s) Performed: Procedure(s): TRANSESOPHAGEAL ECHOCARDIOGRAM (TEE) (N/A) CARDIOVERSION (N/A)  Patient Location: Endoscopy Unit  Anesthesia Type:General  Level of Consciousness: awake, alert , oriented and patient cooperative  Airway & Oxygen Therapy: Patient Spontanous Breathing and Patient connected to nasal cannula oxygen  Post-op Assessment: Report given to PACU RN, Post -op Vital signs reviewed and stable and Patient moving all extremities X 4  Post vital signs: Reviewed and stable  Complications: No apparent anesthesia complications

## 2012-09-18 ENCOUNTER — Encounter (HOSPITAL_COMMUNITY): Payer: Self-pay | Admitting: Cardiology

## 2013-01-02 ENCOUNTER — Other Ambulatory Visit: Payer: Self-pay | Admitting: Gastroenterology

## 2013-01-12 ENCOUNTER — Encounter (HOSPITAL_COMMUNITY): Payer: Self-pay | Admitting: Pharmacy Technician

## 2013-01-14 ENCOUNTER — Encounter: Payer: Self-pay | Admitting: Cardiology

## 2013-01-17 ENCOUNTER — Ambulatory Visit (INDEPENDENT_AMBULATORY_CARE_PROVIDER_SITE_OTHER): Payer: BC Managed Care – PPO | Admitting: Cardiology

## 2013-01-17 ENCOUNTER — Encounter: Payer: Self-pay | Admitting: Cardiology

## 2013-01-17 VITALS — BP 129/84 | HR 72 | Ht 72.0 in | Wt 256.0 lb

## 2013-01-17 DIAGNOSIS — I10 Essential (primary) hypertension: Secondary | ICD-10-CM

## 2013-01-17 DIAGNOSIS — E669 Obesity, unspecified: Secondary | ICD-10-CM

## 2013-01-17 DIAGNOSIS — R400 Somnolence: Secondary | ICD-10-CM

## 2013-01-17 DIAGNOSIS — G4710 Hypersomnia, unspecified: Secondary | ICD-10-CM

## 2013-01-17 DIAGNOSIS — R0609 Other forms of dyspnea: Secondary | ICD-10-CM

## 2013-01-17 DIAGNOSIS — I4891 Unspecified atrial fibrillation: Secondary | ICD-10-CM

## 2013-01-17 DIAGNOSIS — R0683 Snoring: Secondary | ICD-10-CM

## 2013-01-17 NOTE — Progress Notes (Signed)
[address] [place], [place]  [address] Phone: [phone] Fax:  [phone]  Date:  01/17/2013   ID:  Reginald [name], DOB [birth year], MRN [id number]  PCP:  Darnelle Bos, MD   History of Present Illness: Reginald [name] is a 62 y.o. male panel with atrial fibrillation, paroxysmal, chest tightness, pilot here for followup. Anxiety surrounding atrial fibrillation. Had TEE cardioversion on 07/14/12 but unfortunately on 09/12/2012 was back in atrial fibrillation. He felt a playing golf. Mild dizziness. He underwent cardioversion once again. Minor chest burn during cardioversion noted. Atrial size 4.1 cm with moderate LVH on transthoracic echocardiogram with mild mitral regurgitation.  [employer].Moderate ETOH use. Atrial fibrillation returned on the morning of 01/07/13 while out on the practice range. He was able to play golf all day. At approximately 9 PM while sitting outside talking, he noticed conversion back to sinus rhythm. Felt fluttery during atrial fibrillation, no chest pain. Prior nuclear stress test low risk but no ischemia.  Has not had a sleep study. Sometimes wakes up at 230am thinking about work. +Snore. Occasional daytime somnolence.     Wt Readings from Last 3 Encounters:  01/17/13 256 lb (116.121 kg)  09/16/12 245 lb (111.131 kg)  09/16/12 245 lb (111.131 kg)     Past Medical History  Diagnosis Date  . Hypertension   . Pneumonia   . Dysrhythmia 07/10/2012    atrial fibrillation  . Impaired fasting glucose   . Bronchitis, asthmatic   . Contracture of joint of left hand   . Ureteral stone     Past Surgical History  Procedure Laterality Date  . Rotator cuff repair    . Tee without cardioversion N/A 07/14/2012    Procedure: TRANSESOPHAGEAL ECHOCARDIOGRAM (TEE);  Surgeon: Donato Schultz, MD;  Location: [facility] ENDOSCOPY;  Service: Cardiovascular;  Laterality: N/A;  . Cardioversion N/A 07/14/2012    Procedure: CARDIOVERSION;   Surgeon: Donato Schultz, MD;  Location[facility] ENDOSCOPY;  Service: Cardiovascular;  Laterality: N/A;  . Tee without cardioversion N/A 09/16/2012    Procedure: TRANSESOPHAGEAL ECHOCARDIOGRAM (TEE);  Surgeon: Donato Schultz, MD;  Location: [facility] ENDOSCOPY;  Service: Cardiovascular;  Laterality: N/A;  . Cardioversion N/A 09/16/2012    Procedure: CARDIOVERSION;  Surgeon: Donato Schultz, MD;  Location: [facility] ENDOSCOPY;  Service: Cardiovascular;  Laterality: N/A;  . Orif metacarpal fracture Right     Current Outpatient Prescriptions  Medication Sig Dispense Refill  . apixaban (ELIQUIS) 5 MG TABS tablet Take 5 mg by mouth 2 (two) times daily.      Marland Kitchen aspirin 81 MG tablet Take 81 mg by mouth daily.      Marland Kitchen diltiazem (CARDIZEM CD) 120 MG 24 hr capsule Take 120 mg by mouth every morning.      . dronedarone (MULTAQ) 400 MG tablet Take 400 mg by mouth 2 (two) times daily with a meal.      . Glucosamine-Chondroit-Vit C-Mn (GLUCOSAMINE 1500 COMPLEX) CAPS Take 1 capsule by mouth daily.      . Magnesium 250 MG TABS Take 250 mg by mouth daily.      . Multiple Vitamin (MULTIVITAMIN WITH MINERALS) TABS tablet Take 1 tablet by mouth daily.      . potassium gluconate 595 MG TABS tablet Take 595 mg by mouth daily.      . valsartan-hydrochlorothiazide (DIOVAN-HCT) 320-25 MG per tablet Take 1 tablet by mouth every morning.        No current facility-administered medications for this  visit.    Allergies:    Allergies  Allergen Reactions  . Lisinopril Cough    Social History:  The patient  reports that he has never smoked. He does not have any smokeless tobacco history on file. He reports that he drinks about 1.8 ounces of alcohol per week. He reports that he does not use illicit drugs.   ROS:  Please see the history of present illness.   No bleeding, no chest pain, no CVA. Moderate ETOH use.    All other systems reviewed and negative.   PHYSICAL EXAM: VS:  BP 129/84  Pulse 72  Ht 6' (1.829 m)  Wt 256 lb (116.121 kg)  BMI 34.71  kg/m2 Well nourished, well developed, in no acute distress HEENT: normal Neck: no JVD Cardiac:  normal S1, S2; RRR; soft systolic murmur left lower sternal border Lungs:  clear to auscultation bilaterally, no wheezing, rhonchi or rales Abd: soft, nontender, no hepatomegaly, overweight Ext: no edema Skin: warm and dry Neuro: no focal abnormalities noted  Nuclear stress test 08/30/12-no ischemia, normal EF, low risk  Echocardiogram 08/30/12-normal ejection fraction, mild LVH, mild left atrial enlargement, 4.1 cm. Mild mitral regurgitation.  ASSESSMENT AND PLAN:  1. Atrial fibrillation - paroxysmal. Moderate alcohol use likely trigger. Possible sleep apnea, snoring. I have ordered sleep study. I would also like him to sit down with Dr. Hillis Range to discuss his atrial fibrillation. I discussed the possibility of ablation. For now, continue with current antiarrhythmic therapy. It is encouraging that he was able to convert back to sinus rhythm in less than 24 hours. 2. Chronic anticoagulation-for now, we will continue with Eliquis. CHADS-VAS is 1 at the least for hypertension. With the paroxysmal nature of his atrial fibrillation and the possibility of further intervention, I like to continue with his anticoagulation at this time. 3. Obesity-encourage weight loss, exercise 4. Hypertension-currently under good control. No changes made in medications. 5. I will follow up with sleep study as well as Dr. Johney Frame consultation. I will set him up for 6 month followup with me.  Signed, Donato Schultz, MD [facility]  01/17/2013 11:30 AM

## 2013-01-17 NOTE — Patient Instructions (Addendum)
Your physician recommends that you continue on your current medications as directed. Please refer to the Current Medication list given to you today.  Your physician has recommended that you have a sleep study. This test records several body functions during sleep, including: brain activity, eye movement, oxygen and carbon dioxide blood levels, heart rate and rhythm, breathing rate and rhythm, the flow of air through your mouth and nose, snoring, body muscle movements, and chest and belly movement.  You have been referred to Dr. Johney Frame to discuss Atrial Fib.  Your physician wants you to follow-up in: 6 months with Dr. Anne Fu. You will receive a reminder letter in the mail two months in advance. If you don't receive a letter, please call our office to schedule the follow-up appointment.

## 2013-01-25 ENCOUNTER — Encounter (HOSPITAL_COMMUNITY): Payer: Self-pay | Admitting: *Deleted

## 2013-01-31 ENCOUNTER — Encounter (HOSPITAL_COMMUNITY): Payer: BC Managed Care – PPO | Admitting: Anesthesiology

## 2013-01-31 ENCOUNTER — Encounter (HOSPITAL_COMMUNITY)
Admission: RE | Disposition: A | Discharge status: Home / Self Care | Payer: Self-pay | Source: Ambulatory Visit | Attending: Gastroenterology

## 2013-01-31 ENCOUNTER — Encounter (HOSPITAL_COMMUNITY): Payer: Self-pay | Admitting: *Deleted

## 2013-01-31 ENCOUNTER — Ambulatory Visit (HOSPITAL_COMMUNITY)
Admission: RE | Admit: 2013-01-31 | Discharge: 2013-01-31 | Disposition: A | Discharge status: Home / Self Care | Payer: BC Managed Care – PPO | Source: Ambulatory Visit | Attending: Gastroenterology | Admitting: Gastroenterology

## 2013-01-31 ENCOUNTER — Ambulatory Visit (HOSPITAL_COMMUNITY): Payer: BC Managed Care – PPO | Admitting: Anesthesiology

## 2013-01-31 DIAGNOSIS — D126 Benign neoplasm of colon, unspecified: Secondary | ICD-10-CM | POA: Insufficient documentation

## 2013-01-31 DIAGNOSIS — K5730 Diverticulosis of large intestine without perforation or abscess without bleeding: Secondary | ICD-10-CM | POA: Insufficient documentation

## 2013-01-31 DIAGNOSIS — I10 Essential (primary) hypertension: Secondary | ICD-10-CM | POA: Insufficient documentation

## 2013-01-31 DIAGNOSIS — D214 Benign neoplasm of connective and other soft tissue of abdomen: Secondary | ICD-10-CM | POA: Insufficient documentation

## 2013-01-31 DIAGNOSIS — I4891 Unspecified atrial fibrillation: Secondary | ICD-10-CM | POA: Insufficient documentation

## 2013-01-31 DIAGNOSIS — D371 Neoplasm of uncertain behavior of stomach: Secondary | ICD-10-CM | POA: Insufficient documentation

## 2013-01-31 DIAGNOSIS — Z1211 Encounter for screening for malignant neoplasm of colon: Secondary | ICD-10-CM | POA: Insufficient documentation

## 2013-01-31 HISTORY — PX: COLONOSCOPY WITH PROPOFOL: SHX5780

## 2013-01-31 SURGERY — COLONOSCOPY WITH PROPOFOL
Anesthesia: Monitor Anesthesia Care

## 2013-01-31 MED ORDER — PROPOFOL 10 MG/ML IV BOLUS
INTRAVENOUS | Status: DC | PRN
  Administered 2013-01-31: 30 mg via INTRAVENOUS
  Administered 2013-01-31: 20 mg via INTRAVENOUS

## 2013-01-31 MED ORDER — SODIUM CHLORIDE 0.9 % IV SOLN
INTRAVENOUS | Status: DC
  Administered 2013-01-31: 1000 mL via INTRAVENOUS

## 2013-01-31 MED ORDER — PROPOFOL INFUSION 10 MG/ML OPTIME
INTRAVENOUS | Status: DC | PRN
  Administered 2013-01-31: 140 ug/kg/min via INTRAVENOUS

## 2013-01-31 MED ORDER — LACTATED RINGERS IV SOLN: INTRAVENOUS | Status: DC

## 2013-01-31 MED ORDER — LACTATED RINGERS IV SOLN
INTRAVENOUS | Status: DC | PRN
  Administered 2013-01-31 (×2): via INTRAVENOUS

## 2013-01-31 MED ORDER — ONDANSETRON HCL 4 MG/2ML IJ SOLN
INTRAMUSCULAR | Status: DC | PRN
  Administered 2013-01-31: 4 mg via INTRAVENOUS

## 2013-01-31 MED ORDER — KETAMINE HCL 10 MG/ML IJ SOLN
INTRAMUSCULAR | Status: DC | PRN
  Administered 2013-01-31: 25 mg via INTRAVENOUS

## 2013-01-31 MED ORDER — MIDAZOLAM HCL 5 MG/5ML IJ SOLN
INTRAMUSCULAR | Status: DC | PRN
  Administered 2013-01-31 (×2): 1 mg via INTRAVENOUS

## 2013-01-31 SURGICAL SUPPLY — 22 items

## 2013-01-31 NOTE — Preoperative (Signed)
Beta Blockers   Reason not to administer Beta Blockers:Not Applicable, not on home BB 

## 2013-01-31 NOTE — Op Note (Signed)
Procedure: Screening colonoscopy  Endoscopist: Danise Edge  Premedication: Propofol administered by anesthesia  Procedure: The patient was placed in the left lateral decubitus position. Anal inspection and digital rectal exam were normal. The Pentax pediatric colonoscope was introduced into the rectum and advanced to the cecum. A normal-appearing ileocecal valve and appendiceal orifice were identified. Colonic preparation for the exam today was good.  Rectum. Normal. Retroflexed view of the distal rectum normal.  Sigmoid colon and descending colon. Left colonic diverticulosis. At 70 cm from the anal verge a 5 mm sessile polyp was removed with the cold snare and submitted for pathological interpretation.  Splenic flexure. Normal.  Transverse colon. From the mid transverse colon, a 5 mm sessile polyp was removed with the cold snare and submitted for pathological interpretation.  Hepatic flexure. Normal.  Ascending colon. Normal.  Cecum and ileocecal valve. Normal.  Assessment:  #1. From the mid transverse colon, a 5 mm sessile polyp was removed with the cold snare and from the proximal descending colon, a 5 mm sessile polyp was removed with cold snare. Both polyps were submitted in one pathological evaluation .  #2. Left colonic diverticulosis  Recommendations: If the polyps return adenomatous pathologically, the patient should undergo a surveillance colonoscopy in 5 years. If the polyps return nonneoplastic pathologically, the patient should undergo a repeat screening colonoscopy in 10 years.

## 2013-01-31 NOTE — H&P (Signed)
  Procedure: Screening colonoscopy  History: The patient is a 62 year old male born 29-Nov-1950. On 10/11/2002 the patient underwent a normal screening colonoscopy using propofol sedation.  The patient is scheduled to undergo a repeat screening colonoscopy today.  The patient has a history of paroxysmal atrial fibrillation and takes eliquis.  Past medical history: Hypertension. Asthmatic bronchitis. Atrial fibrillation. 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 Left shoulder surgery. Right shoulder surgery.  Allergies: Lisinopril causes cough  Exam: The patient is alert and lying comfortably on the endoscopy stretcher. Abdomen is soft and nontender to palpation. Lungs are clear to auscultation. Cardiac exam reveals a regular rhythm.  Plan: Proceed with screening colonoscopy

## 2013-01-31 NOTE — Transfer of Care (Signed)
Immediate Anesthesia Transfer of Care Note  Patient: Reginald [name]  Procedure(s) Performed: Procedure(s): COLONOSCOPY WITH PROPOFOL (N/A)  Patient Location: PACU  Anesthesia Type:MAC  Level of Consciousness: Patient easily awoken, sedated, comfortable, cooperative, following commands, responds to stimulation.   Airway & Oxygen Therapy: Patient spontaneously breathing, ventilating well, oxygen via simple oxygen mask.  Post-op Assessment: Report given to PACU RN, vital signs reviewed and stable, moving all extremities.   Post vital signs: Reviewed and stable.  Complications: No apparent anesthesia complications

## 2013-01-31 NOTE — Anesthesia Preprocedure Evaluation (Addendum)
Anesthesia Evaluation  Patient identified by MRN, date of birth, ID band Patient awake    Reviewed: Allergy & Precautions, H&P , NPO status , Patient's Chart, lab work & pertinent test results  Airway Mallampati: II TM Distance: >3 FB Neck ROM: Full    Dental no notable dental hx.    Pulmonary asthma ,  breath sounds clear to auscultation  Pulmonary exam normal       Cardiovascular hypertension, Pt. on medications + dysrhythmias Atrial Fibrillation Rhythm:Regular Rate:Normal     Neuro/Psych negative neurological ROS  negative psych ROS   GI/Hepatic negative GI ROS, Neg liver ROS,   Endo/Other  negative endocrine ROS  Renal/GU negative Renal ROS  negative genitourinary   Musculoskeletal negative musculoskeletal ROS (+)   Abdominal   Peds negative pediatric ROS (+)  Hematology negative hematology ROS (+)   Anesthesia Other Findings   Reproductive/Obstetrics negative OB ROS                          Anesthesia Physical Anesthesia Plan  ASA: II  Anesthesia Plan: MAC   Post-op Pain Management:    Induction:   Airway Management Planned: Simple Face Mask  Additional Equipment:   Intra-op Plan:   Post-operative Plan:   Informed Consent: I have reviewed the patients History and Physical, chart, labs and discussed the procedure including the risks, benefits and alternatives for the proposed anesthesia with the patient or authorized representative who has indicated his/her understanding and acceptance.   Dental advisory given  Plan Discussed with: CRNA  Anesthesia Plan Comments:         Anesthesia Quick Evaluation

## 2013-01-31 NOTE — Anesthesia Postprocedure Evaluation (Signed)
  Anesthesia Post-op Note  Patient: Reginald [name]  Procedure(s) Performed: Procedure(s) (LRB): COLONOSCOPY WITH PROPOFOL (N/A)  Patient Location: PACU  Anesthesia Type: MAC  Level of Consciousness: awake and alert   Airway and Oxygen Therapy: Patient Spontanous Breathing  Post-op Pain: mild  Post-op Assessment: Post-op Vital signs reviewed, Patient's Cardiovascular Status Stable, Respiratory Function Stable, Patent Airway and No signs of Nausea or vomiting  Last Vitals:  Filed Vitals:   01/31/13 0955  BP: 115/70  Pulse:   Temp:   Resp: 18    Post-op Vital Signs: stable   Complications: No apparent anesthesia complications

## 2013-02-01 ENCOUNTER — Encounter (HOSPITAL_COMMUNITY): Payer: Self-pay | Admitting: Gastroenterology

## 2013-02-01 ENCOUNTER — Ambulatory Visit: Payer: BC Managed Care – PPO | Admitting: Cardiology

## 2013-02-14 ENCOUNTER — Encounter (HOSPITAL_BASED_OUTPATIENT_CLINIC_OR_DEPARTMENT_OTHER): Payer: BC Managed Care – PPO

## 2013-02-15 ENCOUNTER — Ambulatory Visit (INDEPENDENT_AMBULATORY_CARE_PROVIDER_SITE_OTHER): Payer: BC Managed Care – PPO | Admitting: Internal Medicine

## 2013-02-15 ENCOUNTER — Encounter: Payer: Self-pay | Admitting: Internal Medicine

## 2013-02-15 VITALS — BP 118/80 | HR 59 | Ht 72.0 in | Wt 255.8 lb

## 2013-02-15 DIAGNOSIS — I4891 Unspecified atrial fibrillation: Secondary | ICD-10-CM

## 2013-02-15 NOTE — Patient Instructions (Signed)
Your physician recommends that you schedule a follow-up appointment as needed with Dr Allred   

## 2013-02-15 NOTE — Progress Notes (Signed)
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 male with a h/o HTN and persistent atrial fibrillation who presents today for EP consultation.  He first developed afib in May 2014 while on the golf coarse.  He reports symptoms of fatigue and decreased exercise tolerance with this.  He underwent cardioversion in April and did well until June when he again developed afib while playing golf.  He was cardioverted and placed on Multaq.  He reports having breakthrough afib September of this year which lasted 5 hours.  He has maintained sinus rhythm since that time. Today, he denies symptoms of palpitations, chest pain, shortness of breath, orthopnea, PND, lower extremity edema, dizziness, presyncope, syncope, or neurologic sequela. The patient is tolerating medications without difficulties and is otherwise without complaint today.   Past Medical History  Diagnosis Date  . Hypertension   . Pneumonia     8th grade, without recurrence  . Persistent atrial fibrillation 07/10/2012    cardioversion x 2  . Impaired fasting glucose   . Ureteral stone    Past Surgical History  Procedure Laterality Date  . Rotator cuff repair    . Tee without cardioversion N/A 07/14/2012    Procedure: TRANSESOPHAGEAL ECHOCARDIOGRAM (TEE);  Surgeon: Donato Schultz, MD;  Location: [facility] ENDOSCOPY;  Service: Cardiovascular;  Laterality: N/A;  . Cardioversion N/A 07/14/2012    Procedure: CARDIOVERSION;  Surgeon: Donato Schultz, MD;  Location: [facility] ENDOSCOPY;  Service: Cardiovascular;  Laterality: N/A;  . Tee without cardioversion N/A 09/16/2012    Procedure: TRANSESOPHAGEAL ECHOCARDIOGRAM (TEE);  Surgeon: Donato Schultz, MD;  Location: [facility] ENDOSCOPY;  Service: Cardiovascular;  Laterality: N/A;  . Cardioversion N/A 09/16/2012    Procedure: CARDIOVERSION;  Surgeon: Donato Schultz, MD;  Location: [facility] ENDOSCOPY;  Service: Cardiovascular;  Laterality: N/A;  . Orif metacarpal fracture  Right   . Colonoscopy with propofol N/A 01/31/2013    Procedure: COLONOSCOPY WITH PROPOFOL;  Surgeon: Charolett Bumpers, MD;  Location: [facility] ENDOSCOPY;  Service: Endoscopy;  Laterality: N/A;    Current Outpatient Prescriptions  Medication Sig Dispense Refill  . apixaban (ELIQUIS) 5 MG TABS tablet Take 5 mg by mouth 2 (two) times daily.      Marland Kitchen aspirin 81 MG tablet Take 81 mg by mouth daily.      Marland Kitchen diltiazem (CARDIZEM CD) 120 MG 24 hr capsule Take 120 mg by mouth every morning.      . dronedarone (MULTAQ) 400 MG tablet Take 400 mg by mouth 2 (two) times daily with a meal.      . Glucosamine-Chondroit-Vit C-Mn (GLUCOSAMINE 1500 COMPLEX) CAPS Take 1 capsule by mouth daily.      . Magnesium 250 MG TABS Take 250 mg by mouth daily.      . Multiple Vitamin (MULTIVITAMIN WITH MINERALS) TABS tablet Take 1 tablet by mouth daily.      . potassium gluconate 595 MG TABS tablet Take 595 mg by mouth daily.      . valsartan-hydrochlorothiazide (DIOVAN-HCT) 320-25 MG per tablet Take 1 tablet by mouth every morning.        No current facility-administered medications for this visit.    Allergies  Allergen Reactions  . Lisinopril Cough    History   Social History  . Marital Status: Married    Spouse Name: N/A    Number of Children: N/A  . Years of Education: N/A   Occupational History  . Not on file.   Social  History Main Topics  . Smoking status: Never Smoker   . Smokeless tobacco: Not on file  . Alcohol Use: 1.8 oz/week    1 Glasses of wine, 1 Cans of beer, 1 Shots of liquor per week     Comment: 2-3 glasses of wine or liquor per day  . Drug Use: No  . Sexual Activity: Yes   Other Topics Concern  . Not on file   Social History Narrative   Lives in [place] with spouse.  2 children (ages 37 and 13).   Owns [employer]    Family History  Problem Relation Age of Onset  . Hypertension Mother     ROS- All systems are reviewed and negative except as per the HPI  above  Physical Exam: Filed Vitals:   02/15/13 1052  Pulse: 59  Height: 6' (1.829 m)  Weight: 255 lb 12.8 oz (116.03 kg)    GEN- The patient is well appearing, alert and oriented x 3 today.   Head- normocephalic, atraumatic Eyes-  Sclera clear, conjunctiva pink Ears- hearing intact Oropharynx- clear Neck- supple, no JVP Lymph- no cervical lymphadenopathy Lungs- Clear to ausculation bilaterally, normal work of breathing Heart- Regular rate and rhythm, no murmurs, rubs or gallops, PMI not laterally displaced GI- soft, NT, ND, + BS Extremities- no clubbing, cyanosis, or edema MS- no significant deformity or atrophy Skin- no rash or lesion Psych- euthymic mood, full affect Neuro- strength and sensation are intact  EKG today reveals sinus rhythm 59 bpm, otherwise normal ekg Echo 5/14 is reviewed Dr Anne Fu office notes are also reviewed  Assessment and Plan:  1. Persistent afib He has had recurrent symptomatic afib.  Since starting multaq, he has had only one recurrence.  Therapeutic strategies for afib including medicine and ablation were discussed in detail with the patient today. Risk, benefits, and alternatives to EP study and radiofrequency ablation for afib were also discussed in detail today.  At this point, he would like to continue his current medicine strategy.  If his afib burden increases then he may be more willing to consider ablation.  Flecainide (as a pill in pocket or twice daily) or tikosyn would also be options. His CHADS2VASC score is 1.  I think that it would therefore be reasonable to stop anticoagulation if the patient prefers.  He will discuss this with Dr Anne Fu in follow-up.  I will see as needed should he have worsening afib or wish to consider ablation.

## 2013-05-04 ENCOUNTER — Telehealth: Payer: Self-pay | Admitting: Cardiology

## 2013-05-04 NOTE — Telephone Encounter (Signed)
New message     Have questions regarding ablation

## 2013-05-05 NOTE — Telephone Encounter (Signed)
Patient is scheduled for office visit to discuss ablation.

## 2013-05-15 ENCOUNTER — Encounter: Payer: Self-pay | Admitting: Cardiology

## 2013-05-15 ENCOUNTER — Encounter (INDEPENDENT_AMBULATORY_CARE_PROVIDER_SITE_OTHER): Payer: Self-pay

## 2013-05-15 ENCOUNTER — Ambulatory Visit (INDEPENDENT_AMBULATORY_CARE_PROVIDER_SITE_OTHER): Payer: BC Managed Care – PPO | Admitting: Cardiology

## 2013-05-15 VITALS — BP 153/83 | HR 83 | Ht 72.0 in | Wt 262.0 lb

## 2013-05-15 DIAGNOSIS — I10 Essential (primary) hypertension: Secondary | ICD-10-CM

## 2013-05-15 DIAGNOSIS — I4891 Unspecified atrial fibrillation: Secondary | ICD-10-CM

## 2013-05-15 NOTE — Patient Instructions (Addendum)
Your physician has recommended you make the following change in your medication:   1. Stop Eliquis  Your physician has requested that you have an exercise tolerance test. For further information please visit [email or web address]. Please also follow instruction sheet, as given.  Your physician wants you to follow-up in: 6 months with Dr. Marlou Porch. You will receive a reminder letter in the mail two months in advance. If you don't receive a letter, please call our office to schedule the follow-up appointment.  Your physician has recommended that you wear a 24 hour holter monitor. Holter monitors are medical devices that record the heart's electrical activity. Doctors most often use these monitors to diagnose arrhythmias. Arrhythmias are problems with the speed or rhythm of the heartbeat. The monitor is a small, portable device. You can wear one while you do your normal daily activities. This is usually used to diagnose what is causing palpitations/syncope (passing out).

## 2013-05-15 NOTE — Progress Notes (Signed)
[facility] [address] [place], [place]  [address] Phone: [phone] Fax:  [phone]  Date:  05/15/2013   ID:  RUFORD DUDZINSKI, DOB November 26, 1950, MRN [id number]  PCP:  Horton Finer, MD   History of Present Illness: Reginald [name] is a 63 y.o. male panel with atrial fibrillation, paroxysmal, chest tightness, pilot here for followup. Anxiety surrounding atrial fibrillation. Had TEE cardioversion on 07/14/12 but unfortunately on 09/12/2012 was back in atrial fibrillation. He felt a playing golf. Mild dizziness. He underwent cardioversion once again. Minor chest burn during cardioversion noted. Atrial size 4.1 cm with moderate LVH on transthoracic echocardiogram with mild mitral regurgitation.  [employer].Moderate ETOH use. Atrial fibrillation returned on the morning of 01/07/13 while out on the practice range. He was able to play golf all day. At approximately 9 PM while sitting outside talking, he noticed conversion back to sinus rhythm. Felt fluttery during atrial fibrillation, no chest pain. Prior nuclear stress test low risk but no ischemia.  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      Wt Readings from Last 3 Encounters:  05/15/13 262 lb (118.842 kg)  02/15/13 255 lb 12.8 oz (116.03 kg)  01/17/13 256 lb (116.121 kg)     Past Medical History  Diagnosis Date  . Hypertension   . Pneumonia     8th grade, without recurrence  . Persistent atrial fibrillation 07/10/2012    cardioversion x 2  . Impaired fasting glucose   . Ureteral stone     Past Surgical History  Procedure Laterality Date  . Rotator cuff repair    . Tee without cardioversion N/A 07/14/2012    Procedure: TRANSESOPHAGEAL ECHOCARDIOGRAM (TEE);  Surgeon: [name], MD;  Location: [facility];  Service: Cardiovascular;  Laterality: N/A;  . Cardioversion N/A 07/14/2012    Procedure: CARDIOVERSION;  Surgeon: [name], MD;  Location: [facility]  ENDOSCOPY;  Service: Cardiovascular;  Laterality: N/A;  . Tee without cardioversion N/A 09/16/2012    Procedure: TRANSESOPHAGEAL ECHOCARDIOGRAM (TEE);  Surgeon: [name], MD;  Location: [facility] ENDOSCOPY;  Service: Cardiovascular;  Laterality: N/A;  . Cardioversion N/A 09/16/2012    Procedure: CARDIOVERSION;  Surgeon: [name], MD;  Location: [facility] ENDOSCOPY;  Service: Cardiovascular;  Laterality: N/A;  . Orif metacarpal fracture Right   . Colonoscopy with propofol N/A 01/31/2013    Procedure: COLONOSCOPY WITH PROPOFOL;  Surgeon: [name], MD;  Location: [facility] ENDOSCOPY;  Service: Endoscopy;  Laterality: N/A;    Current Outpatient Prescriptions  Medication Sig Dispense Refill  . apixaban (ELIQUIS) 5 MG TABS tablet Take 5 mg by mouth 2 (two) times daily.      Marland Kitchen diltiazem (CARDIZEM CD) 120 MG 24 hr capsule Take 120 mg by mouth every morning.      . dronedarone (MULTAQ) 400 MG tablet Take 400 mg by mouth 2 (two) times daily with a meal.      . Glucosamine-Chondroit-Vit C-Mn (GLUCOSAMINE 1500 COMPLEX) CAPS Take 1 capsule by mouth daily.      . Magnesium 250 MG TABS Take 250 mg by mouth daily.      . Multiple Vitamin (MULTIVITAMIN WITH MINERALS) TABS tablet Take 1 tablet by mouth daily.      . potassium gluconate 595 MG TABS tablet Take 595 mg by mouth daily.      . valsartan-hydrochlorothiazide (DIOVAN-HCT) 320-25 MG per tablet Take 1 tablet by mouth every morning.       Marland Kitchen  vitamin C (ASCORBIC ACID) 500 MG tablet Take 500 mg by mouth daily.       No current facility-administered medications for this visit.    Allergies:    Allergies  Allergen Reactions  . Lisinopril Cough    Social History:  The patient  reports that he has never smoked. He does not have any smokeless tobacco history on file. He reports that he drinks about 1.8 ounces of alcohol per week. He reports that he does not use illicit drugs.   ROS:  Please see the history of present illness.   No bleeding, no chest pain, no CVA.  Moderate ETOH use.    All other systems reviewed and negative.   PHYSICAL EXAM: VS:  BP 153/83  Pulse 83  Ht 6' (1.829 m)  Wt 262 lb (118.842 kg)  BMI 35.53 kg/m2 Well nourished, well developed, in no acute distress HEENT: normal Neck: no JVD Cardiac:  normal S1, S2; RRR; soft systolic murmur left lower sternal border Lungs:  clear to auscultation bilaterally, no wheezing, rhonchi or rales Abd: soft, nontender, no hepatomegaly, overweight Ext: no edema Skin: warm and dry Neuro: no focal abnormalities noted  Nuclear stress test 08/30/12-no ischemia, normal EF, low risk  Echocardiogram 08/30/12-normal ejection fraction, mild LVH, mild left atrial enlargement, 4.1 cm. Mild mitral regurgitation.  ASSESSMENT AND PLAN:  1. Atrial fibrillation - paroxysmal. Moderate alcohol use likely trigger. Possible sleep apnea, snoring.overall medication seems to be controlling fairly well. We will continue with this strategy for right now. We did have a lengthy discussion about ablation. He has discussed this as well with Dr. [name]. With his left atrial size of 4.1 cm, he would likely be a good candidate. He has a followup appointment with Dr. [name] and if he wishes to talk with him earlier, he will call. For right now, since medication seems to be working, we will continue with this strategy.  2. Chronic anticoagulation-stopped Eliquis. CHADS-VAS is 1 for hypertension. He is not going to undergo any procedures soon such as ablation. If atrial fibrillation were to return and we need to cardiovert him, obviously we would resume anticoagulation. I have reviewed Dr. Jackalyn Lombard note and he is also in agreement.  3. Obesity-encourage weight loss, exercise 4. Hypertension-currently under good control. No changes made in medications. 5. 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 I would like for him to continue his medications, diltiazem as well as Multaq. I will set him up for 6 month followup  with me.  Signed, [name], MD [facility]  05/15/2013 10:22 AM

## 2013-05-31 ENCOUNTER — Encounter (INDEPENDENT_AMBULATORY_CARE_PROVIDER_SITE_OTHER): Payer: BC Managed Care – PPO

## 2013-05-31 ENCOUNTER — Encounter: Payer: Self-pay | Admitting: *Deleted

## 2013-05-31 DIAGNOSIS — I4891 Unspecified atrial fibrillation: Secondary | ICD-10-CM

## 2013-05-31 NOTE — Progress Notes (Signed)
Patient ID: Reginald [name], male   DOB: 04/02/1951, 63 y.o.   MRN: [id number] E-Cardio 24 hour holter monitor applied to patient.

## 2013-06-16 ENCOUNTER — Telehealth: Payer: Self-pay | Admitting: Cardiology

## 2013-06-16 NOTE — Telephone Encounter (Signed)
Monitor results. 

## 2013-06-20 ENCOUNTER — Telehealth: Payer: Self-pay | Admitting: Cardiology

## 2013-06-20 NOTE — Telephone Encounter (Signed)
Holter results

## 2013-07-10 ENCOUNTER — Ambulatory Visit (INDEPENDENT_AMBULATORY_CARE_PROVIDER_SITE_OTHER): Payer: BC Managed Care – PPO | Admitting: Internal Medicine

## 2013-07-10 ENCOUNTER — Encounter: Payer: Self-pay | Admitting: Internal Medicine

## 2013-07-10 VITALS — BP 130/82 | HR 65 | Ht 72.0 in | Wt 259.0 lb

## 2013-07-10 DIAGNOSIS — I4891 Unspecified atrial fibrillation: Secondary | ICD-10-CM

## 2013-07-10 NOTE — Progress Notes (Signed)
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  He first developed afib in May 2014 while on the golf coarse.  He reports symptoms of fatigue and decreased exercise tolerance with this.  He underwent cardioversion in April and did well until June when he again developed afib while playing golf.  He was cardioverted and placed on Multaq.  He reports having breakthrough afib September of this year which lasted 5 hours.  He feels that he has short but frequent breakthrough episodes presently.   Today, he denies symptoms of chest pain, shortness of breath, orthopnea, PND, lower extremity edema, dizziness, presyncope, syncope, or neurologic sequela. The patient is tolerating medications without difficulties and is otherwise without complaint today.   Past Medical History  Diagnosis Date  . Hypertension   . Pneumonia     8th grade, without recurrence  . Persistent atrial fibrillation 07/10/2012    cardioversion x 2  . Impaired fasting glucose   . Ureteral stone    Past Surgical History  Procedure Laterality Date  . Rotator cuff repair    . Tee without cardioversion N/A 07/14/2012    Procedure: TRANSESOPHAGEAL ECHOCARDIOGRAM (TEE);  Surgeon: [name], MD;  Location: [facility];  Service: Cardiovascular;  Laterality: N/A;  . Cardioversion N/A 07/14/2012    Procedure: CARDIOVERSION;  Surgeon: [name], MD;  Location: [facility] ENDOSCOPY;  Service: Cardiovascular;  Laterality: N/A;  . Tee without cardioversion N/A 09/16/2012    Procedure: TRANSESOPHAGEAL ECHOCARDIOGRAM (TEE);  Surgeon: [name], MD;  Location: [facility] ENDOSCOPY;  Service: Cardiovascular;  Laterality: N/A;  . Cardioversion N/A 09/16/2012    Procedure: CARDIOVERSION;  Surgeon: [name], MD;  Location: [facility] ENDOSCOPY;  Service: Cardiovascular;  Laterality: N/A;  . Orif metacarpal  fracture Right   . Colonoscopy with propofol N/A 01/31/2013    Procedure: COLONOSCOPY WITH PROPOFOL;  Surgeon: [name], MD;  Location: [facility] ENDOSCOPY;  Service: Endoscopy;  Laterality: N/A;    Current Outpatient Prescriptions  Medication Sig Dispense Refill  . diltiazem (CARDIZEM CD) 120 MG 24 hr capsule Take 120 mg by mouth every morning.      . dronedarone (MULTAQ) 400 MG tablet Take 400 mg by mouth 2 (two) times daily with a meal.      . Glucosamine-Chondroit-Vit C-Mn (GLUCOSAMINE 1500 COMPLEX) CAPS Take 1 capsule by mouth daily.      . Magnesium 250 MG TABS Take 250 mg by mouth daily.      . Multiple Vitamin (MULTIVITAMIN WITH MINERALS) TABS tablet Take 1 tablet by mouth daily.      . potassium gluconate 595 MG TABS tablet Take 595 mg by mouth daily.      . valsartan-hydrochlorothiazide (DIOVAN-HCT) 320-25 MG per tablet Take 1 tablet by mouth every morning.       . vitamin C (ASCORBIC ACID) 500 MG tablet Take 500 mg by mouth daily.       No current facility-administered medications for this visit.    Allergies  Allergen Reactions  . Lisinopril Cough    History   Social History  . Marital Status: Married    Spouse Name: N/A    Number of Children: N/A  . Years of Education: N/A   Occupational History  . Not on file.   Social History Main Topics  . Smoking status: Never Smoker   . Smokeless tobacco:  Not on file  . Alcohol Use: 1.8 oz/week    1 Glasses of wine, 1 Cans of beer, 1 Shots of liquor per week     Comment: 2-3 glasses of wine or liquor per day  . Drug Use: No  . Sexual Activity: Yes   Other Topics Concern  . Not on file   Social History Narrative   Lives in [place] with spouse.  2 children (ages 75 and 100).   Owns [employer]    Family History  Problem Relation Age of Onset  . Hypertension Mother     ROS- All systems are reviewed and negative except as per the HPI above  Physical Exam: Filed Vitals:   07/10/13 0900  BP:  130/82  Pulse: 65  Height: 6' (1.829 m)  Weight: 259 lb (117.482 kg)    GEN- The patient is well appearing, alert and oriented x 3 today.   Head- normocephalic, atraumatic Eyes-  Sclera clear, conjunctiva pink Ears- hearing intact Oropharynx- clear Neck- supple, no JVP Lymph- no cervical lymphadenopathy Lungs- Clear to ausculation bilaterally, normal work of breathing Heart- Regular rate and rhythm, no murmurs, rubs or gallops, PMI not laterally displaced GI- soft, NT, ND, + BS Extremities- no clubbing, cyanosis, or edema MS- no significant deformity or atrophy Skin- no rash or lesion Psych- euthymic mood, full affect Neuro- strength and sensation are intact  EKG today reveals sinus rhythm 65 bpm, otherwise normal ekg Echo 5/14 is reviewed Dr Marlou Porch office notes are also reviewed  Assessment and Plan:  1. Persistent afib He has had recurrent symptomatic afib.  He feels that he is having breakthrough episodes on multaq. Therapeutic strategies for afib including medicine and ablation were discussed in detail with the patient today. Risk, benefits, and alternatives to EP study and radiofrequency ablation for afib were also discussed in detail today. These risks include but are not limited to stroke, bleeding, vascular damage, tamponade, perforation, damage to the esophagus, lungs, and other structures, pulmonary vein stenosis, worsening renal function, and death. The patient understands these risk and wishes to contemplate this further.  He will contact our office if he decides to proceed with ablation.  He would need at least 3 weeks of anticoagulation with xarelto prior to proceeding with ablation.    I will see as needed should he have worsening afib or wish to consider ablation.

## 2013-07-10 NOTE — Patient Instructions (Signed)
Your physician recommends that you schedule a follow-up appointment as needed  Your physician has recommended that you have an ablation. Catheter ablation is a medical procedure used to treat some cardiac arrhythmias (irregular heartbeats). During catheter ablation, a long, thin, flexible tube is put into a blood vessel in your groin (upper thigh), or neck. This tube is called an ablation catheter. It is then guided to your heart through the blood vessel. Radio frequency waves destroy small areas of heart tissue where abnormal heartbeats may cause an arrhythmia to start. Please see the instruction sheet given to you today.  Call Janan Halter, RN if you decide to proceed with Ablation

## 2013-07-11 ENCOUNTER — Encounter: Payer: BC Managed Care – PPO | Admitting: Physician Assistant

## 2013-07-23 ENCOUNTER — Encounter: Payer: Self-pay | Admitting: *Deleted

## 2013-07-27 ENCOUNTER — Telehealth (HOSPITAL_COMMUNITY): Payer: Self-pay

## 2013-07-28 ENCOUNTER — Telehealth (HOSPITAL_COMMUNITY): Payer: Self-pay

## 2013-08-01 ENCOUNTER — Encounter (HOSPITAL_COMMUNITY): Payer: BC Managed Care – PPO

## 2013-08-07 ENCOUNTER — Telehealth: Payer: Self-pay | Admitting: Cardiology

## 2013-08-07 NOTE — Telephone Encounter (Signed)
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

## 2013-08-07 NOTE — Telephone Encounter (Signed)
lmptcb. I looked in chart,  pt has never seen [place] W. PA, was scheduled for GXT w/SW, PA but then cancelled test. Pt saw Dr. [name] 07/10/13.

## 2013-08-07 NOTE — Telephone Encounter (Signed)
I s/w pt in refernce to his paper work for the [employer]. Pt kept saying Richardson Dopp, [facility] was in the room with Dr. Marlou Porch back in 05/2013 when during my conversation I discovered it was not [place] that pt was talking about but Dr. Marlou Porch RMA [employer] Southward. The pt states he told Dr. Marlou Porch back in 05/2013 he needed [place] paperwork filled out by the end of [place], then pt said he needs it by 08/14/13 at the latest since he has his [place] physical on 08/15/13. I asked about the paperwork and he said Dr. Marlou Porch has it. The pt then said he could email to me if we need to, so I gave the pt my work email and said that I when I get it I will give the paperwork to [employer] Southward, RMA to give to Dr. Marlou Porch. Pt was happy with this. P then also told me about some lab work with his PCP Dr. Maxwell Caul in April 2015, which I am guessing he was meaning needs to be with the [facility] paperwork.

## 2013-08-07 NOTE — Telephone Encounter (Signed)
Follow Up   Pt returned the call//SR

## 2013-08-07 NOTE — Telephone Encounter (Signed)
Please make sure that we have the paperwork available that he is requesting. Thanks

## 2013-08-07 NOTE — Telephone Encounter (Signed)
Hi Dr. Marlou Porch and Fausto Skillern,  So the pt sent me his [place] paperwork to my work email and I have printed it off for you. I will give it to [facility] when he comes in 08/08/13.

## 2013-08-07 NOTE — Telephone Encounter (Signed)
Thanks

## 2013-08-08 NOTE — Telephone Encounter (Signed)
Paperwork sent to medical records to have records pulled to be faxed.

## 2013-08-09 ENCOUNTER — Encounter (HOSPITAL_COMMUNITY): Payer: BC Managed Care – PPO

## 2013-08-09 ENCOUNTER — Other Ambulatory Visit: Payer: Self-pay | Admitting: Cardiology

## 2013-08-14 ENCOUNTER — Telehealth: Payer: Self-pay | Admitting: Cardiology

## 2013-08-14 NOTE — Telephone Encounter (Signed)
Dr Marlou Porch was gathering information for patient to take with him to his physical. He is scheduled for 4pm tomorrow. He would like to know if this information is ready and when he can pick it up. Please call and advise.

## 2013-08-17 NOTE — Telephone Encounter (Signed)
LVM  For patient to call and confirm his PCP did receive the paperwork that was requested.

## 2013-09-01 ENCOUNTER — Other Ambulatory Visit (HOSPITAL_COMMUNITY): Payer: BC Managed Care – PPO

## 2013-09-06 ENCOUNTER — Telehealth: Payer: Self-pay | Admitting: Cardiology

## 2013-09-06 NOTE — Telephone Encounter (Signed)
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 He states that he is going to drop off the paperwork at the front office with in an hour.   He states he only has 60 days to complete these requirements. Will forward this note to Dr Marlou Porch and the paperwork will be given to Dr Marlou Porch when received.

## 2013-09-06 NOTE — Telephone Encounter (Signed)
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 States he needs to have certain tests but is requesting to speak with Dr. Marlou Porch nurse.

## 2013-09-06 NOTE — Telephone Encounter (Signed)
Walk in Pt Form " sealed Enveloped" Address to Wonda Horner ( gave to Phil Campbell)

## 2013-09-12 NOTE — Telephone Encounter (Signed)
I have not seen any documents. Did he drop them off? [name], MD

## 2013-09-13 ENCOUNTER — Other Ambulatory Visit (HOSPITAL_COMMUNITY): Payer: BC Managed Care – PPO

## 2013-09-13 NOTE — Telephone Encounter (Signed)
The pts [place] paperwork was turned into the front office then placed in your mailbox on 5/28 without my knowledge. I have left them on your cart for your review.

## 2013-09-15 NOTE — Telephone Encounter (Signed)
Follow Up  Pt called to follow Up on paperwork//SR

## 2013-09-15 NOTE — Telephone Encounter (Signed)
The pt is requesting that Dr Marlou Porch call him concerning his paperwork and requirements per the [place].  The pt is aware that Dr Marlou Porch will not be in the office next week and the pt states that he is going on vacation the week after that.  He is concerned that his time is running out as he only has 60 days from the time he received his letter from the [facility] on 5/21.   Please call the pt at [phone].

## 2013-09-15 NOTE — Telephone Encounter (Signed)
Spoke on telephone. 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  Also, coming in for echo.  Appt states limited echo but please make it normal echo, full study. AFIB.  Thanks.

## 2013-09-18 ENCOUNTER — Ambulatory Visit
Admission: RE | Admit: 2013-09-18 | Discharge: 2013-09-18 | Disposition: A | Discharge status: Home / Self Care | Payer: PRIVATE HEALTH INSURANCE | Source: Ambulatory Visit | Attending: Internal Medicine | Admitting: Internal Medicine

## 2013-09-18 ENCOUNTER — Other Ambulatory Visit: Payer: Self-pay | Admitting: Internal Medicine

## 2013-09-18 ENCOUNTER — Ambulatory Visit (HOSPITAL_COMMUNITY): Payer: PRIVATE HEALTH INSURANCE | Attending: Cardiology | Admitting: Cardiology

## 2013-09-18 ENCOUNTER — Other Ambulatory Visit: Payer: Self-pay

## 2013-09-18 DIAGNOSIS — I4891 Unspecified atrial fibrillation: Secondary | ICD-10-CM

## 2013-09-18 DIAGNOSIS — N200 Calculus of kidney: Secondary | ICD-10-CM

## 2013-09-18 NOTE — Telephone Encounter (Signed)
**Note De-Identified Reginald [name] Obfuscation** I have ordered a complete echo and sent a high importance message to [facility] pool to arrange as the pts echo is scheduled for today at 1 pm.

## 2013-09-18 NOTE — Progress Notes (Signed)
Echo performed. 

## 2013-09-25 ENCOUNTER — Other Ambulatory Visit: Payer: Self-pay | Admitting: Internal Medicine

## 2013-09-25 DIAGNOSIS — R109 Unspecified abdominal pain: Secondary | ICD-10-CM

## 2013-09-26 ENCOUNTER — Other Ambulatory Visit: Payer: PRIVATE HEALTH INSURANCE

## 2013-09-30 ENCOUNTER — Other Ambulatory Visit: Payer: Self-pay | Admitting: Cardiology

## 2013-10-03 ENCOUNTER — Other Ambulatory Visit (HOSPITAL_COMMUNITY): Payer: BC Managed Care – PPO

## 2013-10-03 ENCOUNTER — Ambulatory Visit
Admission: RE | Admit: 2013-10-03 | Discharge: 2013-10-03 | Disposition: A | Discharge status: Home / Self Care | Payer: PRIVATE HEALTH INSURANCE | Source: Ambulatory Visit | Attending: Internal Medicine | Admitting: Internal Medicine

## 2013-10-03 DIAGNOSIS — R109 Unspecified abdominal pain: Secondary | ICD-10-CM

## 2013-10-04 ENCOUNTER — Other Ambulatory Visit: Payer: Self-pay | Admitting: Internal Medicine

## 2013-10-04 DIAGNOSIS — R1084 Generalized abdominal pain: Secondary | ICD-10-CM

## 2013-10-04 DIAGNOSIS — N289 Disorder of kidney and ureter, unspecified: Secondary | ICD-10-CM

## 2013-10-06 ENCOUNTER — Ambulatory Visit
Admission: RE | Admit: 2013-10-06 | Discharge: 2013-10-06 | Disposition: A | Discharge status: Home / Self Care | Payer: PRIVATE HEALTH INSURANCE | Source: Ambulatory Visit | Attending: Internal Medicine | Admitting: Internal Medicine

## 2013-10-06 DIAGNOSIS — N289 Disorder of kidney and ureter, unspecified: Secondary | ICD-10-CM

## 2013-10-12 NOTE — Telephone Encounter (Signed)
Encounter complete. 

## 2013-10-18 ENCOUNTER — Telehealth: Payer: Self-pay | Admitting: Cardiology

## 2013-10-18 NOTE — Telephone Encounter (Signed)
Per pt call - went into At Fib this AM.  Reports feeling anxious but no other s/s.  He is not on any anti-coagulation but has Eliquis and took one this AM.  Aware I will discuss with Dr Marlou Porch and call him back.

## 2013-10-18 NOTE — Telephone Encounter (Signed)
Since he is having another episode of atrial fibrillation which causes anxiety, I would like for him to once again discuss his options with Dr. [name] who he has seen previously in November of 2014. At that time ablation was discussed. Other options include flecainide or perhaps hospitalization for dofetilide load.  He has taken his Multaq this morning. Take extra Cartia. He believes that his A. fib started at 7 AM this morning. Hopefully we'll have conversion soon. Go ahead and continue with Eliquis in case we have to proceed with cardioversion.  [name], MD

## 2013-10-18 NOTE — Telephone Encounter (Signed)
Spoke with pt who is aware of MD orders and plan.  If he continues in At Fib after taking the extra [place] as RXed he will call back so that cardioversion can be scheduled.

## 2013-10-18 NOTE — Telephone Encounter (Signed)
New problem:     Pt called to report he went into Afib this morning at 7am please give him a call back on what to do.

## 2013-10-19 ENCOUNTER — Telehealth: Payer: Self-pay | Admitting: Cardiology

## 2013-10-19 MED ORDER — APIXABAN 5 MG PO TABS: 5.0000 mg | ORAL_TABLET | Freq: Two times a day (BID) | ORAL | Status: DC

## 2013-10-19 NOTE — Telephone Encounter (Signed)
Spoke with patient who states he went back into NSR with heart rate in the 50's-60's yesterday afternoon after taking an extra Diltiazem.  Patient asks if he should resume Diltiazem once daily and I advised patient to take as directed, which is 120 mg once daily.  Patient states he is currently taking Eliquis 5 mg and advised him to continue; I added this to patient's med list but did not send Rx as patient currently has samples.  Patient states he is scheduled for exercise stress test next week and was advised to hold Diltiazem for 48 hours prior to test for FAA requirements.  I advised patient that holding the Diltiazem may cause him to go back into atrial fibrillation.  I advised him I will send message to Dr. Marlou Porch for his approval to hold medication.  Patient is aware that Dr. Marlou Porch is not in the office today or tomorrow and that it may be Monday before our office gets back to him.  Patient's GXT appointment is Wednesday so I advised patient to hold Diltiazem on Monday until he hears from our office.  I advised patient to continue Diltiazem 120 mg once daily until that time.  Patient verbalized understanding and agreement.

## 2013-10-19 NOTE — Telephone Encounter (Signed)
New problem   Pt is back in normal rhythm today and need to know if he need to go back to taking his regular medication as normal. Pt has question concerning medication he need to stop taking for his stress test. Please call pt.

## 2013-10-22 NOTE — Telephone Encounter (Signed)
I am fine with him holding diltiazem per FAA requirements for 48 hours prior to ETT. I am glad that he converted to NSR. Continue with Eliquis until appt. With Dr. [name] in case ablation is decided upon.

## 2013-10-23 NOTE — Telephone Encounter (Signed)
Following up:    Pt returning this office's call please give him a call back.

## 2013-10-23 NOTE — Telephone Encounter (Signed)
Left message for patient to call back for Dr. Kandis Mannan advice

## 2013-10-23 NOTE — Telephone Encounter (Signed)
Pt advised of Dr Marlou Porch' comments.  Pt scheduled for GXT here on Wed July 15 with Brynda Rim.  Pt requesting to speak with Dr Marlou Porch before GXT on Wednesday about FAA requirements. Pt states he may not need GXT depending on conversation with Dr Marlou Porch about FAA requirements.   I will forward to Dr Marlou Porch.

## 2013-10-24 NOTE — Telephone Encounter (Signed)
Talked with him on phone. Proceed with ETT.  [name], MD

## 2013-10-25 ENCOUNTER — Other Ambulatory Visit: Payer: Self-pay | Admitting: Cardiology

## 2013-10-25 ENCOUNTER — Ambulatory Visit (INDEPENDENT_AMBULATORY_CARE_PROVIDER_SITE_OTHER): Payer: PRIVATE HEALTH INSURANCE | Admitting: Physician Assistant

## 2013-10-25 ENCOUNTER — Encounter: Payer: Self-pay | Admitting: Physician Assistant

## 2013-10-25 VITALS — Ht 72.0 in

## 2013-10-25 DIAGNOSIS — I4891 Unspecified atrial fibrillation: Secondary | ICD-10-CM

## 2013-10-25 NOTE — Progress Notes (Signed)
Reviewed with Nicki Reaper. Low risk test. Excellent.

## 2013-10-25 NOTE — Progress Notes (Signed)
Exercise Treadmill Test  Pre-Exercise Testing Evaluation Rhythm: normal sinus  Rate: 65 bpm     Test  Exercise Tolerance Test Ordering MD: [name], MD  Interpreting MD: Richardson Dopp, PA-C  Unique Test No: 1  Treadmill:  1  Indication for ETT: FAA  Contraindication to ETT: No   Stress Modality: exercise - treadmill  Cardiac Imaging Performed: non   Protocol: standard Bruce - maximal  Max BP:  200/76  Max MPHR (bpm):  158 85% MPR (bpm):  134  MPHR obtained (bpm):  161 % MPHR obtained:  101  Reached 85% MPHR (min:sec):  4:45 Total Exercise Time (min-sec):  9:00  Workload in METS:  10.1 Borg Scale: 18  Reason ETT Terminated:  desired heart rate attained    ST Segment Analysis At Rest: normal ST segments - no evidence of significant ST depression With Exercise: non-specific ST changes  Other Information Arrhythmia:  No Angina during ETT:  absent (0) Quality of ETT:  diagnostic  ETT Interpretation:  normal - no evidence of ischemia by ST analysis  Comments: Good exercise capacity. No chest pain. Normal BP response to exercise. Insignificant up-sloping ST depression at peak exercise.  No significant ST changes to suggest ischemia.   Recommendations: F/u with Dr. [name] as planned. Signed,  Richardson Dopp, PA-C   10/25/2013 11:16 AM

## 2013-10-26 ENCOUNTER — Encounter: Payer: Self-pay | Admitting: Cardiology

## 2013-10-26 NOTE — Telephone Encounter (Signed)
Encounter complete. 

## 2013-10-30 ENCOUNTER — Telehealth: Payer: Self-pay | Admitting: Cardiology

## 2013-10-30 NOTE — Telephone Encounter (Signed)
Call placed to Mr. Flaum: advised his [place] information is ready from pick up

## 2013-10-30 NOTE — Telephone Encounter (Signed)
New message     Is the "health summary" for the [place] ready for pt to pick up?

## 2013-10-30 NOTE — Telephone Encounter (Signed)
Paperwork is ready and was taken to [facility] last week for pt to pick up.  They will call him to come in to pick up.

## 2013-10-30 NOTE — Telephone Encounter (Signed)
Mr. Brozek signed ROI and pick up [place] information:djc

## 2013-10-31 ENCOUNTER — Telehealth: Payer: Self-pay | Admitting: Cardiology

## 2013-11-01 ENCOUNTER — Telehealth: Payer: Self-pay | Admitting: Cardiology

## 2013-11-01 NOTE — Telephone Encounter (Signed)
New message           Pt would like to discuss [place] / please give pt a call

## 2013-11-02 NOTE — Telephone Encounter (Signed)
Pt needed another copy of his stress test which he spoke with [facility] about.  They are getting it for him.

## 2013-11-06 ENCOUNTER — Other Ambulatory Visit: Payer: Self-pay | Admitting: Cardiology

## 2013-11-11 ENCOUNTER — Other Ambulatory Visit: Payer: Self-pay | Admitting: Cardiology

## 2013-11-20 ENCOUNTER — Other Ambulatory Visit: Payer: Self-pay | Admitting: Cardiology

## 2013-12-09 ENCOUNTER — Other Ambulatory Visit: Payer: Self-pay | Admitting: Cardiology

## 2013-12-14 ENCOUNTER — Telehealth: Payer: Self-pay | Admitting: Cardiology

## 2013-12-14 NOTE — Telephone Encounter (Addendum)
Spoke with patient who needs prior authorization for coverage for Eliquis. Called [employer] to clarify prior authorization instructions. Authorization form faxed back to [phone]) and is awaiting approval. Spoke with patient again to update him on status of refill and that it would be 2-4 days to know. Informed patient that he could come get samples of Eliquis if needed.  Will route to [facility], Dr. Marlou Porch' nurse, for follow up.

## 2013-12-14 NOTE — Telephone Encounter (Signed)
°  Patient has questions regarding Eliquis. Please call and advise.

## 2013-12-14 NOTE — Telephone Encounter (Signed)
error 

## 2013-12-17 ENCOUNTER — Other Ambulatory Visit: Payer: Self-pay | Admitting: Cardiology

## 2013-12-19 ENCOUNTER — Other Ambulatory Visit: Payer: Self-pay

## 2013-12-19 ENCOUNTER — Telehealth: Payer: Self-pay | Admitting: *Deleted

## 2013-12-19 NOTE — Telephone Encounter (Signed)
Eliquis samples placed at the front desk for patient as he stated that he is pending prior authorization approval.

## 2013-12-20 NOTE — Telephone Encounter (Signed)
Pt aware prior authorization obtained and is good thru 12/16/2014 for Eliquis 5 mg BID thru [employer].

## 2014-01-16 ENCOUNTER — Other Ambulatory Visit: Payer: Self-pay | Admitting: Cardiology

## 2014-01-21 ENCOUNTER — Other Ambulatory Visit: Payer: Self-pay | Admitting: Cardiology

## 2014-02-16 ENCOUNTER — Other Ambulatory Visit: Payer: Self-pay | Admitting: Cardiology

## 2014-03-02 ENCOUNTER — Telehealth: Payer: Self-pay | Admitting: Cardiology

## 2014-03-02 NOTE — Telephone Encounter (Signed)
PT CALLING RE POSSIBLY BEING IN AFIB SINCE 3P, PLS ADVISE [phone]

## 2014-03-02 NOTE — Telephone Encounter (Signed)
The pt states that he thinks he went into a-fib a little more than an hour ago. He states that this happens from time to time so he knows when he goes into a-fib and that most of the time he will convert on his own within 12 hours. He reports a HR of 72 to 80 or 85 taken by his Fit Bit. Will discuss with the DOD, Dr Harrington Challenger.

## 2014-03-02 NOTE — Telephone Encounter (Signed)
**Note De-Identified Dalyn Becker Obfuscation** Per Melina Copa, PA (APP Flex) refer to EP as the pt has a history of recurrent A-fib and at his last OV with Dr [name] on 07/10/13 an ablation was discussed with the pt. The pt is advised that I am forwarding this message to Dr [name] and his nurse to call him back on Monday (11/23). He is advised that if his s/s worsen to call 911 or have someone drive him to the closest ER or Urgent care. He verbalized understanding.

## 2014-03-04 NOTE — Telephone Encounter (Signed)
Kelly Please call patient and see if he is back in sinus

## 2014-03-05 NOTE — Telephone Encounter (Signed)
Follow Up        Pt calling stating that he has been in A-fib since Friday and wants to come be seen by Dr. [name] but doesn't want Dr. Jackalyn Lombard earliest appt on 03/14/14. Please call pt back and advise.

## 2014-03-05 NOTE — Telephone Encounter (Signed)
Discussed with Dr. [name] to see Reginald Palau, NP in the afib clinic tomorrow

## 2014-03-06 ENCOUNTER — Encounter (HOSPITAL_COMMUNITY): Payer: Self-pay | Admitting: Nurse Practitioner

## 2014-03-06 ENCOUNTER — Ambulatory Visit (HOSPITAL_COMMUNITY)
Admission: RE | Admit: 2014-03-06 | Discharge: 2014-03-06 | Disposition: A | Discharge status: Home / Self Care | Payer: PRIVATE HEALTH INSURANCE | Source: Ambulatory Visit | Attending: Nurse Practitioner | Admitting: Nurse Practitioner

## 2014-03-06 ENCOUNTER — Encounter (HOSPITAL_COMMUNITY): Payer: Self-pay | Admitting: *Deleted

## 2014-03-06 VITALS — BP 108/62 | HR 99 | Resp 14 | Ht 72.0 in | Wt 259.8 lb

## 2014-03-06 DIAGNOSIS — Z01818 Encounter for other preprocedural examination: Secondary | ICD-10-CM

## 2014-03-06 DIAGNOSIS — I481 Persistent atrial fibrillation: Secondary | ICD-10-CM | POA: Insufficient documentation

## 2014-03-06 DIAGNOSIS — I4819 Other persistent atrial fibrillation: Secondary | ICD-10-CM

## 2014-03-06 LAB — BASIC METABOLIC PANEL
Anion gap: 12 (ref 5–15)
BUN: 14 mg/dL (ref 6–23)
CO2: 27 mEq/L (ref 19–32)
Calcium: 9.2 mg/dL (ref 8.4–10.5)
Chloride: 99 mEq/L (ref 96–112)
Creatinine, Ser: 1.05 mg/dL (ref 0.50–1.35)
GFR calc Af Amer: 85 mL/min — ABNORMAL LOW (ref >90)
GFR, EST NON AFRICAN AMERICAN: 74 mL/min — AB (ref >90)
Glucose, Bld: 81 mg/dL (ref 70–99)
POTASSIUM: 3.8 meq/L (ref 3.7–5.3)
Sodium: 138 mEq/L (ref 137–147)

## 2014-03-06 LAB — CBC
HCT: 44.9 % (ref 39.0–52.0)
Hemoglobin: 15.6 g/dL (ref 13.0–17.0)
MCH: 31 pg (ref 26.0–34.0)
MCHC: 34.7 g/dL (ref 30.0–36.0)
MCV: 89.3 fL (ref 78.0–100.0)
PLATELETS: 173 10*3/uL (ref 150–400)
RBC: 5.03 MIL/uL (ref 4.22–5.81)
RDW: 12.9 % (ref 11.5–15.5)
WBC: 6.2 10*3/uL (ref 4.0–10.5)

## 2014-03-06 NOTE — Progress Notes (Signed)
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 male with a h/o HTN and persistent atrial fibrillation who presents today for EP consultation.  He first developed afib in May 2014 while on the golf [place].  He reports symptoms of fatigue and decreased exercise tolerance with this.  He underwent cardioversion in April and did well until June when he again developed afib while playing golf.  He was cardioverted and placed on Multaq.  He reports having breakthrough afib September of this year which lasted 5 hours.    He has maintained sinus rhythm until Friday when he returned to afib.  Does not know of any one trigger other than poor sleep the night before.Took one extra dose of cartia without any change. Continues to take multaq 400 mg bid. He reports fatigue when in afib. He does want  to proceed with ablation, but not before christmas. He is requesting DCCV because he does not want to stay in afib that long. He has only missed one doe of eliquis and that was 3 weeks ago.  Today, he denies symptoms of , chest pain,   orthopnea, PND, lower extremity edema, dizziness, presyncope, syncope, or neurologic sequela. He does notice fatigue and mild dyspnea on exertion. The patient is tolerating medications without difficulties and is otherwise without complaint today.   Past Medical History  Diagnosis Date  . Hypertension   . Pneumonia     8th grade, without recurrence  . Persistent atrial fibrillation 07/10/2012    cardioversion x 2  . Impaired fasting glucose   . Ureteral stone    Past Surgical History  Procedure Laterality Date  . Rotator cuff repair    . Tee without cardioversion N/A 07/14/2012    Procedure: TRANSESOPHAGEAL ECHOCARDIOGRAM (TEE);  Surgeon: [name], MD;  Location: [facility];  Service: Cardiovascular;  Laterality: N/A;  . Cardioversion N/A 07/14/2012    Procedure: CARDIOVERSION;  Surgeon: [name], MD;   Location: [facility] ENDOSCOPY;  Service: Cardiovascular;  Laterality: N/A;  . Tee without cardioversion N/A 09/16/2012    Procedure: TRANSESOPHAGEAL ECHOCARDIOGRAM (TEE);  Surgeon: [name], MD;  Location: [facility] ENDOSCOPY;  Service: Cardiovascular;  Laterality: N/A;  . Cardioversion N/A 09/16/2012    Procedure: CARDIOVERSION;  Surgeon: [name], MD;  Location: [facility] ENDOSCOPY;  Service: Cardiovascular;  Laterality: N/A;  . Orif metacarpal fracture Right   . Colonoscopy with propofol N/A 01/31/2013    Procedure: COLONOSCOPY WITH PROPOFOL;  Surgeon: [name], MD;  Location: [facility] ENDOSCOPY;  Service: Endoscopy;  Laterality: N/A;    Current Outpatient Prescriptions  Medication Sig Dispense Refill  . CARTIA XT 120 MG 24 hr capsule TAKE ONE CAPSULE BY MOUTH EVERY DAY. NEED APPOITMENT FOR FURTHER REFILLS 30 capsule 0  . ELIQUIS 5 MG TABS tablet TAKE 1 TABLET BY MOUTH TWICE DAILY 60 tablet 11  . Glucosamine-Chondroit-Vit C-Mn (GLUCOSAMINE 1500 COMPLEX) CAPS Take 1 capsule by mouth daily.    . Magnesium 250 MG TABS Take 250 mg by mouth daily.    . MULTAQ 400 MG tablet TAKE 1 TABLET BY MOUTH TWICE DAILY 60 tablet 6  . Multiple Vitamin (MULTIVITAMIN WITH MINERALS) TABS tablet Take 1 tablet by mouth daily.    . potassium gluconate 595 MG TABS tablet Take 595 mg by mouth daily.    . valsartan-hydrochlorothiazide (DIOVAN-HCT) 320-25 MG per tablet Take 1 tablet by mouth every morning.     . vitamin C (ASCORBIC ACID)  500 MG tablet Take 500 mg by mouth daily.     No current facility-administered medications for this encounter.    Allergies  Allergen Reactions  . Lisinopril Cough    History   Social History  . Marital Status: Married    Spouse Name: N/A    Number of Children: N/A  . Years of Education: N/A   Occupational History  . Not on file.   Social History Main Topics  . Smoking status: Never Smoker   . Smokeless tobacco: Not on file  . Alcohol Use: 1.8 oz/week    1 Glasses of wine, 1 Cans  of beer, 1 Shots of liquor per week     Comment: 2-3 glasses of wine or liquor per day  . Drug Use: No  . Sexual Activity: Yes   Other Topics Concern  . Not on file   Social History Narrative   Lives in [place] with spouse.  2 children (ages 46 and 51).   Owns [employer]    Family History  Problem Relation Age of Onset  . Hypertension Mother     ROS- as per the HPI above  Physical Exam: Filed Vitals:   03/06/14 1500  BP: 108/62  Pulse: 99  Resp: 14  Height: 6' (1.829 m)  Weight: 259 lb 12.8 oz (117.845 kg)  SpO2: 97%    GEN- The patient is well appearing, alert and oriented x 3 today.   Head- normocephalic, atraumatic Eyes-  Sclera clear, conjunctiva pink Ears- hearing intact Oropharynx- clear Neck- supple, no JVP Lymph- no cervical lymphadenopathy Lungs- Clear to ausculation bilaterally, normal work of breathing Heart- Irregular rate and rhythm, no murmurs, rubs or gallops, PMI not laterally displaced GI- soft, NT, ND, + BS Extremities- no clubbing, cyanosis, or edema MS- no significant deformity or atrophy Skin- no rash or lesion Psych- euthymic mood, full affect Neuro- strength and sensation are intact  EKG today reveals afib, 109 bpm, QRS 94 ms, QTC 446ms.  Echo- 09/18/13 - Left ventricle: The cavity size was normal. There was moderate concentric hypertrophy. Systolic function was normal. The estimated ejection fraction was in the range of 60% to 65%. Wall motion was normal; there were no regional wall motion abnormalities. Doppler parameters are consistent with abnormal left ventricular relaxation (grade 1 diastolic dysfunction). There was no evidence of elevated ventricular filling pressure by Doppler parameters. - Aortic valve: Trileaflet; normal thickness leaflets. - Aortic root: The aortic root was normal in size. - Mitral valve: Structurally normal valve. There was no regurgitation. - Left atrium: The atrium was  mildly dilated, 43 mm - Right ventricle: Systolic function was normal. - Tricuspid valve: There was no regurgitation. - Pulmonic valve: Structurally normal valve. - Pulmonary arteries: Systolic pressure was within the normal range. - Pericardium, extracardiac: There was no pericardial effusion.   Assessment and Plan:  1. Persistent afib He has had recurrent symptomatic afib failing Multaq, continue for now.   Can try to take an extra Cartia 120 mg tonight at bedtime. Will set up for DCCV, first available 12/2. Regular use of Eliquis with one dose missed 3 weeks ago. Pre procedure labs today.  Will see Dr. [name] tomorrow at 9:15a to discuss ablation which pt would like to pursue after Christmas.

## 2014-03-06 NOTE — Patient Instructions (Addendum)
Your physician has recommended that you have a Cardioversion (DCCV). Electrical Cardioversion uses a jolt of electricity to your heart either through paddles or wired patches attached to your chest. This is a controlled, usually prescheduled, procedure. Defibrillation is done under light anesthesia in the hospital, and you usually go home the day of the procedure. This is done to get your heart back into a normal rhythm. You are not awake for the procedure. Please see the instruction sheet given to you today.  Please review the letter you were given for instructions.  Your physician recommends that you schedule a follow-up appointment in: 2 weeks// our office will call you to schedule.

## 2014-03-07 ENCOUNTER — Encounter: Payer: Self-pay | Admitting: *Deleted

## 2014-03-07 ENCOUNTER — Ambulatory Visit (INDEPENDENT_AMBULATORY_CARE_PROVIDER_SITE_OTHER): Payer: PRIVATE HEALTH INSURANCE | Admitting: Internal Medicine

## 2014-03-07 ENCOUNTER — Encounter: Payer: Self-pay | Admitting: Internal Medicine

## 2014-03-07 VITALS — BP 116/74 | HR 65 | Ht 72.0 in | Wt 258.4 lb

## 2014-03-07 DIAGNOSIS — I4819 Other persistent atrial fibrillation: Secondary | ICD-10-CM

## 2014-03-07 DIAGNOSIS — I10 Essential (primary) hypertension: Secondary | ICD-10-CM

## 2014-03-07 DIAGNOSIS — I481 Persistent atrial fibrillation: Secondary | ICD-10-CM

## 2014-03-07 NOTE — Patient Instructions (Signed)
Your physician has recommended that you have an ablation. Catheter ablation is a medical procedure used to treat some cardiac arrhythmias (irregular heartbeats). During catheter ablation, a long, thin, flexible tube is put into a blood vessel in your groin (upper thigh), or neck. This tube is called an ablation catheter. It is then guided to your heart through the blood vessel. Radio frequency waves destroy small areas of heart tissue where abnormal heartbeats may cause an arrhythmia to start. Please see the instruction sheet given to you today.   See instruction sheet for procedure 

## 2014-03-07 NOTE — Progress Notes (Signed)
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 He first developed afib in May 2014 while on the golf coarse.  He reports symptoms of fatigue and decreased exercise tolerance with this.  He underwent cardioversion in April and did well until June when he again developed afib while playing golf.  He was cardioverted and placed on Multaq.   He had maintained sinus rhythm until Friday when he returned to afib.  Does not know of any one trigger other than poor sleep the night before.Took one extra dose of cartia without any change. Continues to take multaq 400 mg bid. He reports fatigue when in afib. He does want  to proceed with ablation, but not before christmas. He is requesting DCCV because he does not want to stay in afib that long. He has only missed one doe of eliquis and that was 3 weeks ago.  He was set up for cardioversion yesterday, pending first available , 12/2, and is here today to talk to Dr. [name] to get set up for ablation.  Today, he denies symptoms of , chest pain,   orthopnea, PND, lower extremity edema, dizziness, presyncope, syncope, or neurologic sequela. He does notice fatigue and mild dyspnea on exertion when in afib. The patient is tolerating medications without difficulties and is otherwise without complaint today.   Past Medical History  Diagnosis Date  . Hypertension   . Pneumonia     8th grade, without recurrence  . Persistent atrial fibrillation 07/10/2012    cardioversion x 2  . Impaired fasting glucose   . Ureteral stone    Past Surgical History  Procedure Laterality Date  . Rotator cuff repair    . Tee without cardioversion N/A 07/14/2012    Procedure: TRANSESOPHAGEAL ECHOCARDIOGRAM (TEE);  Surgeon: [name], MD;  Location: [place];  Service: Cardiovascular;  Laterality: N/A;  .  Cardioversion N/A 07/14/2012    Procedure: CARDIOVERSION;  Surgeon: [name], MD;  Location: [facility] ENDOSCOPY;  Service: Cardiovascular;  Laterality: N/A;  . Tee without cardioversion N/A 09/16/2012    Procedure: TRANSESOPHAGEAL ECHOCARDIOGRAM (TEE);  Surgeon: [name], MD;  Location: [facility] ENDOSCOPY;  Service: Cardiovascular;  Laterality: N/A;  . Cardioversion N/A 09/16/2012    Procedure: CARDIOVERSION;  Surgeon: [name], MD;  Location: [facility] ENDOSCOPY;  Service: Cardiovascular;  Laterality: N/A;  . Orif metacarpal fracture Right   . Colonoscopy with propofol N/A 01/31/2013    Procedure: COLONOSCOPY WITH PROPOFOL;  Surgeon: [name], MD;  Location: [facility] ENDOSCOPY;  Service: Endoscopy;  Laterality: N/A;    Current Outpatient Prescriptions  Medication Sig Dispense Refill  . CARTIA XT 120 MG 24 hr capsule TAKE ONE CAPSULE BY MOUTH EVERY DAY. NEED APPOITMENT FOR FURTHER REFILLS (Patient taking differently: TAKE ONE CAPSULE BY MOUTH EVERY DAY) 30 capsule 0  . ELIQUIS 5 MG TABS tablet TAKE 1 TABLET BY MOUTH TWICE DAILY 60 tablet 11  . Glucosamine-Chondroit-Vit C-Mn (GLUCOSAMINE 1500 COMPLEX) CAPS Take 1 capsule by mouth daily.    . Magnesium 250 MG TABS Take 250 mg by mouth daily.    . MULTAQ 400 MG tablet TAKE 1 TABLET BY MOUTH TWICE DAILY 60 tablet 6  . Multiple Vitamin (MULTIVITAMIN WITH MINERALS) TABS tablet Take 1 tablet by mouth daily.    . potassium gluconate 595 MG TABS tablet Take 595 mg by mouth daily.    Marland Kitchen  valsartan-hydrochlorothiazide (DIOVAN-HCT) 320-25 MG per tablet Take 1 tablet by mouth every morning.     . vitamin C (ASCORBIC ACID) 500 MG tablet Take 500 mg by mouth daily.     No current facility-administered medications for this visit.    Allergies  Allergen Reactions  . Lisinopril Cough    History   Social History  . Marital Status: Married    Spouse Name: N/A    Number of Children: N/A  . Years of Education: N/A   Occupational History  . Not on file.   Social  History Main Topics  . Smoking status: Never Smoker   . Smokeless tobacco: Not on file  . Alcohol Use: 1.8 oz/week    1 Glasses of wine, 1 Cans of beer, 1 Shots of liquor per week     Comment: 2-3 glasses of wine or liquor per day  . Drug Use: No  . Sexual Activity: Yes   Other Topics Concern  . Not on file   Social History Narrative   Lives in [place] with spouse.  2 children (ages 94 and 38).   Owns [employer]    Family History  Problem Relation Age of Onset  . Hypertension Mother     ROS- full review of systems per system review sheet and as as above.  Physical Exam: Filed Vitals:   03/07/14 0925  BP: 116/74  Pulse: 65  Height: 6' (1.829 m)  Weight: 258 lb 6.4 oz (117.209 kg)    GEN- The patient is well appearing, alert and oriented x 3 today.   Head- normocephalic, atraumatic Eyes-  Sclera clear, conjunctiva pink Ears- hearing intact Oropharynx- clear Neck- supple, no JVP Lymph- no cervical lymphadenopathy Lungs- Clear to ausculation bilaterally, normal work of breathing Heart- Irregular rate and rhythm, no murmurs, rubs or gallops, PMI not laterally displaced GI- soft, NT, ND, + BS Extremities- no clubbing, cyanosis, or edema MS- no significant deformity or atrophy Skin- no rash or lesion Psych- euthymic mood, full affect Neuro- strength and sensation are intact  EKG - not obtained today.  Echo- 09/18/13 - Left ventricle: The cavity size was normal. There was moderate concentric hypertrophy. Systolic function was normal. The estimated ejection fraction was in the range of 60% to 65%. Wall motion was normal; there were no regional wall motion abnormalities. Doppler parameters are consistent with abnormal left ventricular relaxation (grade 1 diastolic dysfunction). There was no evidence of elevated ventricular filling pressure by Doppler parameters. - Aortic valve: Trileaflet; normal thickness leaflets. - Aortic root: The  aortic root was normal in size. - Mitral valve: Structurally normal valve. There was no regurgitation. - Left atrium: The atrium was mildly dilated, 43 mm - Right ventricle: Systolic function was normal. - Tricuspid valve: There was no regurgitation. - Pulmonic valve: Structurally normal valve. - Pulmonary arteries: Systolic pressure was within the normal range. - Pericardium, extracardiac: There was no pericardial effusion.   Assessment and Plan:  1. Persistent afib  He has had recurrent symptomatic afib failing Multaq. Proceed to DCCV as scheduled 03/14/14. Continue to work on lifestyle factors, restorative sleep, exercise, weight loss, reduction of alcohol use.  Therapeutic strategies for afib including medicine and ablation were discussed in detail with the patient today. Risk, benefits, and alternatives to EP study and radiofrequency ablation for afib were also discussed in detail today. These risks include but are not limited to stroke, bleeding, vascular damage, tamponade, perforation, damage to the esophagus, lungs, and other structures, pulmonary vein  stenosis, radiation exposure, worsening renal function, and death. The patient understands these risk and wishes to proceed.  We will therefore proceed with catheter ablation January 7,2016.  Continue eliquis stressing no missed doses.   2. htn Stable No change required today

## 2014-03-08 NOTE — Addendum Note (Signed)
Encounter addended by: Vanessa Barbara, CCT on: 03/08/2014 10:07 AM<BR>     Documentation filed: Charges VN

## 2014-03-14 ENCOUNTER — Ambulatory Visit (HOSPITAL_COMMUNITY): Payer: 59 | Admitting: Anesthesiology

## 2014-03-14 ENCOUNTER — Other Ambulatory Visit: Payer: Self-pay | Admitting: Cardiology

## 2014-03-14 ENCOUNTER — Encounter (HOSPITAL_COMMUNITY)
Admission: RE | Disposition: A | Discharge status: Home / Self Care | Payer: Self-pay | Source: Ambulatory Visit | Attending: Cardiology

## 2014-03-14 ENCOUNTER — Ambulatory Visit (HOSPITAL_COMMUNITY)
Admission: RE | Admit: 2014-03-14 | Discharge: 2014-03-14 | Disposition: A | Discharge status: Home / Self Care | Payer: 59 | Source: Ambulatory Visit | Attending: Cardiology | Admitting: Cardiology

## 2014-03-14 ENCOUNTER — Encounter (HOSPITAL_COMMUNITY): Payer: Self-pay

## 2014-03-14 DIAGNOSIS — Z888 Allergy status to other drugs, medicaments and biological substances status: Secondary | ICD-10-CM | POA: Insufficient documentation

## 2014-03-14 DIAGNOSIS — I10 Essential (primary) hypertension: Secondary | ICD-10-CM | POA: Insufficient documentation

## 2014-03-14 DIAGNOSIS — Z79899 Other long term (current) drug therapy: Secondary | ICD-10-CM | POA: Diagnosis not present

## 2014-03-14 DIAGNOSIS — I481 Persistent atrial fibrillation: Secondary | ICD-10-CM | POA: Diagnosis not present

## 2014-03-14 HISTORY — PX: CARDIOVERSION: SHX1299

## 2014-03-14 SURGERY — CARDIOVERSION
Anesthesia: General

## 2014-03-14 MED ORDER — PROPOFOL 10 MG/ML IV BOLUS
INTRAVENOUS | Status: DC | PRN
  Administered 2014-03-14: 100 mg via INTRAVENOUS

## 2014-03-14 MED ORDER — SODIUM CHLORIDE 0.9 % IV SOLN
INTRAVENOUS | Status: DC
  Administered 2014-03-14: 10:00:00 via INTRAVENOUS

## 2014-03-14 MED ORDER — SODIUM CHLORIDE 0.9 % IV SOLN
INTRAVENOUS | Status: DC | PRN
  Administered 2014-03-14: 10:00:00 via INTRAVENOUS

## 2014-03-14 NOTE — CV Procedure (Signed)
Entered in error

## 2014-03-14 NOTE — Discharge Instructions (Signed)

## 2014-03-14 NOTE — Op Note (Signed)
Patient anesthetized with propofol by anesthesia  With pads in AP position patient cardioverted to SR with 200 J synchronized biphasic energy to SR. 12 lead EKG pending

## 2014-03-14 NOTE — Transfer of Care (Signed)
Immediate Anesthesia Transfer of Care Note  Patient: Reginald [name]  Procedure(s) Performed: Procedure(s): CARDIOVERSION (N/A)  Patient Location: PACU  Anesthesia Type:General  Level of Consciousness: awake, alert , oriented and sedated  Airway & Oxygen Therapy: Patient Spontanous Breathing and Patient connected to nasal cannula oxygen  Post-op Assessment: Report given to PACU RN, Post -op Vital signs reviewed and stable and Patient moving all extremities  Post vital signs: Reviewed and stable  Complications: No apparent anesthesia complications

## 2014-03-14 NOTE — Interval H&P Note (Signed)
History and Physical Interval Note:  03/14/2014 10:50 AM  Reginald [name]  has presented today for surgery, with the diagnosis of afib  The various methods of treatment have been discussed with the patient and family. After consideration of risks, benefits and other options for treatment, the patient has consented to  Procedure(s): CARDIOVERSION (N/A) as a surgical intervention .  The patient's history has been reviewed, patient examined, no change in status, stable for surgery.  I have reviewed the patient's chart and labs.  Questions were answered to the patient's satisfaction.     Dorris Carnes

## 2014-03-14 NOTE — Anesthesia Postprocedure Evaluation (Signed)
Anesthesia Post Note  Patient: Reginald [name]  Procedure(s) Performed: Procedure(s) (LRB): CARDIOVERSION (N/A)  Anesthesia type: general  Patient location: PACU  Post pain: Pain level controlled  Post assessment: Patient's Cardiovascular Status Stable  Last Vitals:  Filed Vitals:   03/14/14 1138  BP: 112/66  Pulse: 71  Temp:   Resp: 17    Post vital signs: Reviewed and stable  Level of consciousness: sedated  Complications: No apparent anesthesia complications

## 2014-03-14 NOTE — Anesthesia Postprocedure Evaluation (Signed)
  Anesthesia Post-op Note  Patient: Reginald [name]  Procedure(s) Performed: Procedure(s): CARDIOVERSION (N/A)  Patient Location: PACU  Anesthesia Type:General  Level of Consciousness: awake, alert , oriented and sedated  Airway and Oxygen Therapy: Patient Spontanous Breathing and Patient connected to nasal cannula oxygen  Post-op Pain: none  Post-op Assessment: Post-op Vital signs reviewed, Patient's Cardiovascular Status Stable, Respiratory Function Stable and Patent Airway  Post-op Vital Signs: Reviewed and stable  Last Vitals:  Filed Vitals:   03/14/14 1001  BP: 129/93  Pulse: 114  Temp: 36.9 C  Resp: 16    Complications: No apparent anesthesia complications

## 2014-03-14 NOTE — H&P (View-Only) (Signed)
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 He first developed afib in May 2014 while on the golf coarse.  He reports symptoms of fatigue and decreased exercise tolerance with this.  He underwent cardioversion in April and did well until June when he again developed afib while playing golf.  He was cardioverted and placed on Multaq.   He had maintained sinus rhythm until Friday when he returned to afib.  Does not know of any one trigger other than poor sleep the night before.Took one extra dose of cartia without any change. Continues to take multaq 400 mg bid. He reports fatigue when in afib. He does want  to proceed with ablation, but not before christmas. He is requesting DCCV because he does not want to stay in afib that long. He has only missed one doe of eliquis and that was 3 weeks ago.  He was set up for cardioversion yesterday, pending first available , 12/2, and is here today to talk to Dr. [name] to get set up for ablation.  Today, he denies symptoms of , chest pain,   orthopnea, PND, lower extremity edema, dizziness, presyncope, syncope, or neurologic sequela. He does notice fatigue and mild dyspnea on exertion when in afib. The patient is tolerating medications without difficulties and is otherwise without complaint today.   Past Medical History  Diagnosis Date  . Hypertension   . Pneumonia     8th grade, without recurrence  . Persistent atrial fibrillation 07/10/2012    cardioversion x 2  . Impaired fasting glucose   . Ureteral stone    Past Surgical History  Procedure Laterality Date  . Rotator cuff repair    . Tee without cardioversion N/A 07/14/2012    Procedure: TRANSESOPHAGEAL ECHOCARDIOGRAM (TEE);  Surgeon: [name], MD;  Location: [place];  Service: Cardiovascular;  Laterality: N/A;  .  Cardioversion N/A 07/14/2012    Procedure: CARDIOVERSION;  Surgeon: [name], MD;  Location: [facility] ENDOSCOPY;  Service: Cardiovascular;  Laterality: N/A;  . Tee without cardioversion N/A 09/16/2012    Procedure: TRANSESOPHAGEAL ECHOCARDIOGRAM (TEE);  Surgeon: [name], MD;  Location: [facility] ENDOSCOPY;  Service: Cardiovascular;  Laterality: N/A;  . Cardioversion N/A 09/16/2012    Procedure: CARDIOVERSION;  Surgeon: [name], MD;  Location: [facility] ENDOSCOPY;  Service: Cardiovascular;  Laterality: N/A;  . Orif metacarpal fracture Right   . Colonoscopy with propofol N/A 01/31/2013    Procedure: COLONOSCOPY WITH PROPOFOL;  Surgeon: [name], MD;  Location: [facility] ENDOSCOPY;  Service: Endoscopy;  Laterality: N/A;    Current Outpatient Prescriptions  Medication Sig Dispense Refill  . CARTIA XT 120 MG 24 hr capsule TAKE ONE CAPSULE BY MOUTH EVERY DAY. NEED APPOITMENT FOR FURTHER REFILLS (Patient taking differently: TAKE ONE CAPSULE BY MOUTH EVERY DAY) 30 capsule 0  . ELIQUIS 5 MG TABS tablet TAKE 1 TABLET BY MOUTH TWICE DAILY 60 tablet 11  . Glucosamine-Chondroit-Vit C-Mn (GLUCOSAMINE 1500 COMPLEX) CAPS Take 1 capsule by mouth daily.    . Magnesium 250 MG TABS Take 250 mg by mouth daily.    . MULTAQ 400 MG tablet TAKE 1 TABLET BY MOUTH TWICE DAILY 60 tablet 6  . Multiple Vitamin (MULTIVITAMIN WITH MINERALS) TABS tablet Take 1 tablet by mouth daily.    . potassium gluconate 595 MG TABS tablet Take 595 mg by mouth daily.    Marland Kitchen  valsartan-hydrochlorothiazide (DIOVAN-HCT) 320-25 MG per tablet Take 1 tablet by mouth every morning.     . vitamin C (ASCORBIC ACID) 500 MG tablet Take 500 mg by mouth daily.     No current facility-administered medications for this visit.    Allergies  Allergen Reactions  . Lisinopril Cough    History   Social History  . Marital Status: Married    Spouse Name: N/A    Number of Children: N/A  . Years of Education: N/A   Occupational History  . Not on file.   Social  History Main Topics  . Smoking status: Never Smoker   . Smokeless tobacco: Not on file  . Alcohol Use: 1.8 oz/week    1 Glasses of wine, 1 Cans of beer, 1 Shots of liquor per week     Comment: 2-3 glasses of wine or liquor per day  . Drug Use: No  . Sexual Activity: Yes   Other Topics Concern  . Not on file   Social History Narrative   Lives in [place] with spouse.  2 children (ages 10 and 50).   Owns [employer]    Family History  Problem Relation Age of Onset  . Hypertension Mother     ROS- full review of systems per system review sheet and as as above.  Physical Exam: Filed Vitals:   03/07/14 0925  BP: 116/74  Pulse: 65  Height: 6' (1.829 m)  Weight: 258 lb 6.4 oz (117.209 kg)    GEN- The patient is well appearing, alert and oriented x 3 today.   Head- normocephalic, atraumatic Eyes-  Sclera clear, conjunctiva pink Ears- hearing intact Oropharynx- clear Neck- supple, no JVP Lymph- no cervical lymphadenopathy Lungs- Clear to ausculation bilaterally, normal work of breathing Heart- Irregular rate and rhythm, no murmurs, rubs or gallops, PMI not laterally displaced GI- soft, NT, ND, + BS Extremities- no clubbing, cyanosis, or edema MS- no significant deformity or atrophy Skin- no rash or lesion Psych- euthymic mood, full affect Neuro- strength and sensation are intact  EKG - not obtained today.  Echo- 09/18/13 - Left ventricle: The cavity size was normal. There was moderate concentric hypertrophy. Systolic function was normal. The estimated ejection fraction was in the range of 60% to 65%. Wall motion was normal; there were no regional wall motion abnormalities. Doppler parameters are consistent with abnormal left ventricular relaxation (grade 1 diastolic dysfunction). There was no evidence of elevated ventricular filling pressure by Doppler parameters. - Aortic valve: Trileaflet; normal thickness leaflets. - Aortic root: The  aortic root was normal in size. - Mitral valve: Structurally normal valve. There was no regurgitation. - Left atrium: The atrium was mildly dilated, 43 mm - Right ventricle: Systolic function was normal. - Tricuspid valve: There was no regurgitation. - Pulmonic valve: Structurally normal valve. - Pulmonary arteries: Systolic pressure was within the normal range. - Pericardium, extracardiac: There was no pericardial effusion.   Assessment and Plan:  1. Persistent afib  He has had recurrent symptomatic afib failing Multaq. Proceed to DCCV as scheduled 03/14/14. Continue to work on lifestyle factors, restorative sleep, exercise, weight loss, reduction of alcohol use.  Therapeutic strategies for afib including medicine and ablation were discussed in detail with the patient today. Risk, benefits, and alternatives to EP study and radiofrequency ablation for afib were also discussed in detail today. These risks include but are not limited to stroke, bleeding, vascular damage, tamponade, perforation, damage to the esophagus, lungs, and other structures, pulmonary vein  stenosis, radiation exposure, worsening renal function, and death. The patient understands these risk and wishes to proceed.  We will therefore proceed with catheter ablation January 7,2016.  Continue eliquis stressing no missed doses.   2. htn Stable No change required today

## 2014-03-14 NOTE — Anesthesia Preprocedure Evaluation (Signed)
Anesthesia Evaluation    Airway        Dental   Pulmonary          Cardiovascular hypertension,     Neuro/Psych    GI/Hepatic   Endo/Other    Renal/GU      Musculoskeletal   Abdominal   Peds  Hematology   Anesthesia Other Findings   Reproductive/Obstetrics                             Anesthesia Physical Anesthesia Plan  ASA: III  Anesthesia Plan:    Post-op Pain Management:    Induction:   Airway Management Planned:   Additional Equipment:   Intra-op Plan:   Post-operative Plan:   Informed Consent:   Plan Discussed with:   Anesthesia Plan Comments:         Anesthesia Quick Evaluation

## 2014-03-15 ENCOUNTER — Encounter (HOSPITAL_COMMUNITY): Payer: Self-pay | Admitting: Internal Medicine

## 2014-04-11 ENCOUNTER — Other Ambulatory Visit: Payer: PRIVATE HEALTH INSURANCE

## 2014-04-11 ENCOUNTER — Encounter: Payer: Self-pay | Admitting: Internal Medicine

## 2014-04-14 ENCOUNTER — Other Ambulatory Visit: Payer: Self-pay | Admitting: Cardiology

## 2014-04-18 ENCOUNTER — Encounter (HOSPITAL_COMMUNITY)
Admission: RE | Disposition: A | Discharge status: Home / Self Care | Payer: Self-pay | Source: Ambulatory Visit | Attending: Cardiovascular Disease

## 2014-04-18 ENCOUNTER — Ambulatory Visit (HOSPITAL_COMMUNITY)
Admission: RE | Admit: 2014-04-18 | Discharge: 2014-04-18 | Disposition: A | Discharge status: Home / Self Care | Payer: PRIVATE HEALTH INSURANCE | Source: Ambulatory Visit | Attending: Cardiovascular Disease | Admitting: Cardiovascular Disease

## 2014-04-18 ENCOUNTER — Encounter: Payer: Self-pay | Admitting: *Deleted

## 2014-04-18 ENCOUNTER — Encounter (HOSPITAL_COMMUNITY): Payer: Self-pay

## 2014-04-18 DIAGNOSIS — I4819 Other persistent atrial fibrillation: Secondary | ICD-10-CM

## 2014-04-18 DIAGNOSIS — I340 Nonrheumatic mitral (valve) insufficiency: Secondary | ICD-10-CM | POA: Insufficient documentation

## 2014-04-18 DIAGNOSIS — I10 Essential (primary) hypertension: Secondary | ICD-10-CM | POA: Insufficient documentation

## 2014-04-18 DIAGNOSIS — Q211 Atrial septal defect: Secondary | ICD-10-CM | POA: Insufficient documentation

## 2014-04-18 DIAGNOSIS — I4891 Unspecified atrial fibrillation: Secondary | ICD-10-CM

## 2014-04-18 DIAGNOSIS — Z888 Allergy status to other drugs, medicaments and biological substances status: Secondary | ICD-10-CM | POA: Insufficient documentation

## 2014-04-18 DIAGNOSIS — I481 Persistent atrial fibrillation: Secondary | ICD-10-CM | POA: Insufficient documentation

## 2014-04-18 HISTORY — PX: TEE WITHOUT CARDIOVERSION: SHX5443

## 2014-04-18 SURGERY — ECHOCARDIOGRAM, TRANSESOPHAGEAL
Anesthesia: Moderate Sedation

## 2014-04-18 MED ORDER — BUTAMBEN-TETRACAINE-BENZOCAINE 2-2-14 % EX AERO
INHALATION_SPRAY | CUTANEOUS | Status: DC | PRN
  Administered 2014-04-18: 2 via TOPICAL

## 2014-04-18 MED ORDER — FENTANYL CITRATE 0.05 MG/ML IJ SOLN
INTRAMUSCULAR | Status: AC
  Filled 2014-04-18: qty 2

## 2014-04-18 MED ORDER — MIDAZOLAM HCL 5 MG/ML IJ SOLN
INTRAMUSCULAR | Status: AC
  Filled 2014-04-18: qty 2

## 2014-04-18 MED ORDER — SODIUM CHLORIDE 0.9 % IV SOLN
INTRAVENOUS | Status: DC
  Administered 2014-04-18: 500 mL via INTRAVENOUS

## 2014-04-18 MED ORDER — FENTANYL CITRATE 0.05 MG/ML IJ SOLN
INTRAMUSCULAR | Status: DC | PRN
  Administered 2014-04-18: 50 ug via INTRAVENOUS
  Administered 2014-04-18: 25 ug via INTRAVENOUS

## 2014-04-18 MED ORDER — MIDAZOLAM HCL 10 MG/2ML IJ SOLN
INTRAMUSCULAR | Status: DC | PRN
  Administered 2014-04-18 (×2): 2 mg via INTRAVENOUS

## 2014-04-18 NOTE — Discharge Instructions (Signed)
Transesophageal Echocardiogram °Transesophageal echocardiography (TEE) is a picture test of your heart using sound waves. The pictures taken can give very detailed pictures of your heart. This can help your doctor see if there are problems with your heart. TEE can check: °· If your heart has blood clots in it. °· How well your heart valves are working. °· If you have an infection on the inside of your heart. °· Some of the major arteries of your heart. °· If your heart valve is working after a repair. °· Your heart before a procedure that uses a shock to your heart to get the rhythm back to normal. °BEFORE THE PROCEDURE °· Do not eat or drink for 6 hours before the procedure or as told by your doctor. °· Make plans to have someone drive you home after the procedure. Do not drive yourself home. °· An IV tube will be put in your arm. °PROCEDURE °· You will be given a medicine to help you relax (sedative). It will be given through the IV tube. °· A numbing medicine will be sprayed or gargled in the back of your throat to help numb it. °· The tip of the probe is placed into the back of your mouth. You will be asked to swallow. This helps to pass the probe into your esophagus. °· Once the tip of the probe is in the right place, your doctor can take pictures of your heart. °· You may feel pressure at the back of your throat. °AFTER THE PROCEDURE °· You will be taken to a recovery area so the sedative can wear off. °· Your throat may be sore and scratchy. This will go away slowly over time. °· You will go home when you are fully awake and able to swallow liquids. °· You should have someone stay with you for the next 24 hours. °· Do not drive or operate machinery for the next 24 hours. °Document Released: 01/25/2009 Document Revised: 04/04/2013 Document Reviewed: 09/29/2012 °[employer]® Patient Information ©2015 [employer]. This information is not intended to replace advice given to you by your health care provider. Make  sure you discuss any questions you have with your health care provider. ° °

## 2014-04-18 NOTE — CV Procedure (Signed)
   Transesophageal Echocardiogram: Indication:  Pre Ablation Sedation: Versed: 5, Fentanyl: 75, Other:   ASA: 2, Airway: 2  Procedure:  The patient was moderately sedated with the above doses of versed and fentanyl.  Using digital technique an omniplane probe was advanced into the distal esophagus without incident. Transgastric imaging revealed normal LV function with no RWMA;s and no mural apical thrombus moderate LVH.  Marland Kitchen  Estimated ejection fraction was 60%.  Right sided cardiac chambers were normal with no evidence of pulmonary hypertension.  The pulmonary and tricuspid valves were structurally normal.  There was mild  TR The mitral valve was structurally normal with trivial mitral regurgitation.    The aortic valve was trileaflet with no AS/AR The aortic root was normal.    The was an atrial septal aneurysm Bubble study was positive for PFO The septum was mobile   The LAA was well visualized in orthogonal views.  There was no spontaneous contrast and no thrombus.    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

## 2014-04-18 NOTE — Progress Notes (Signed)
   Echocardiogram 2D Echocardiogram has been performed.  Reginald [name] M 04/18/2014, 8:52 AM

## 2014-04-18 NOTE — H&P (Signed)
Expand All Collapse All    Primary Care Physician: Horton Finer, MD Referring Physician: Dr Karolee Ohs is a 64 y.o. male with a h/o HTN and persistent atrial fibrillation who presented to the Afib clinic yesterday. He first developed afib in May 2014 while [place] the golf [place]. He reports symptoms of fatigue and decreased exercise tolerance with this. He underwent cardioversion in April and did well until June when he again developed afib while playing golf. He was cardioverted and placed on Multaq.  He had maintained sinus rhythm until Friday when he returned to afib. Does not know of any one trigger other than poor sleep the night before.Took one extra dose of cartia without any change. Continues to take multaq 400 mg bid. He reports fatigue when in afib. He does want to proceed with ablation, but not before christmas. He is requesting DCCV because he does not want to stay in afib that long. He has only missed one doe of eliquis and that was 3 weeks ago.  He was set up for cardioversion yesterday, pending first available , 12/2, and is here today to talk to Dr. [name] to get set up for ablation.  Today, he denies symptoms of , chest pain, orthopnea, PND, lower extremity edema, dizziness, presyncope, syncope, or neurologic sequela. He does notice fatigue and mild dyspnea on exertion when in afib. The patient is tolerating medications without difficulties and is otherwise without complaint today.   Past Medical History  Diagnosis Date  . Hypertension   . Pneumonia     8th grade, without recurrence  . Persistent atrial fibrillation 07/10/2012    cardioversion x 2  . Impaired fasting glucose   . Ureteral stone    Past Surgical History  Procedure Laterality Date  . Rotator cuff repair    . Tee without cardioversion N/A 07/14/2012    Procedure: TRANSESOPHAGEAL ECHOCARDIOGRAM (TEE); Surgeon: [name], MD; Location:  [place]; Service: Cardiovascular; Laterality: N/A;  . Cardioversion N/A 07/14/2012    Procedure: CARDIOVERSION; Surgeon: [name], MD; Location: [facility] ENDOSCOPY; Service: Cardiovascular; Laterality: N/A;  . Tee without cardioversion N/A 09/16/2012    Procedure: TRANSESOPHAGEAL ECHOCARDIOGRAM (TEE); Surgeon: [name], MD; Location: [facility] ENDOSCOPY; Service: Cardiovascular; Laterality: N/A;  . Cardioversion N/A 09/16/2012    Procedure: CARDIOVERSION; Surgeon: [name], MD; Location: [facility] ENDOSCOPY; Service: Cardiovascular; Laterality: N/A;  . Orif metacarpal fracture Right   . Colonoscopy with propofol N/A 01/31/2013    Procedure: COLONOSCOPY WITH PROPOFOL; Surgeon: [name], MD; Location: [facility] ENDOSCOPY; Service: Endoscopy; Laterality: N/A;    Current Outpatient Prescriptions  Medication Sig Dispense Refill  . CARTIA XT 120 MG 24 hr capsule TAKE ONE CAPSULE BY MOUTH EVERY DAY. NEED APPOITMENT FOR FURTHER REFILLS (Patient taking differently: TAKE ONE CAPSULE BY MOUTH EVERY DAY) 30 capsule 0  . ELIQUIS 5 MG TABS tablet TAKE 1 TABLET BY MOUTH TWICE DAILY 60 tablet 11  . Glucosamine-Chondroit-Vit C-Mn (GLUCOSAMINE 1500 COMPLEX) CAPS Take 1 capsule by mouth daily.    . Magnesium 250 MG TABS Take 250 mg by mouth daily.    . MULTAQ 400 MG tablet TAKE 1 TABLET BY MOUTH TWICE DAILY 60 tablet 6  . Multiple Vitamin (MULTIVITAMIN WITH MINERALS) TABS tablet Take 1 tablet by mouth daily.    . potassium gluconate 595 MG TABS tablet Take 595 mg by mouth daily.    . valsartan-hydrochlorothiazide (DIOVAN-HCT) 320-25 MG per tablet Take 1 tablet by mouth every morning.     Marland Kitchen  vitamin C (ASCORBIC ACID) 500 MG tablet Take 500 mg by mouth daily.     No current facility-administered medications for this visit.    Allergies  Allergen Reactions  . Lisinopril Cough    History   Social  History  . Marital Status: Married    Spouse Name: N/A    Number of Children: N/A  . Years of Education: N/A   Occupational History  . Not on file.   Social History Main Topics  . Smoking status: Never Smoker   . Smokeless tobacco: Not on file  . Alcohol Use: 1.8 oz/week    1 Glasses of wine, 1 Cans of beer, 1 Shots of liquor per week     Comment: 2-3 glasses of wine or liquor per day  . Drug Use: No  . Sexual Activity: Yes   Other Topics Concern  . Not on file   Social History Narrative   Lives in [place] with spouse. 2 children (ages 3 and 30).   Owns [employer]    Family History  Problem Relation Age of Onset  . Hypertension Mother     ROS- full review of systems per system review sheet and as as above.  Physical Exam: Filed Vitals:   03/07/14 0925  BP: 116/74  Pulse: 65  Height: 6' (1.829 m)  Weight: 258 lb 6.4 oz (117.209 kg)    GEN- The patient is well appearing, alert and oriented x 3 today.  Head- normocephalic, atraumatic Eyes- Sclera clear, conjunctiva pink Ears- hearing intact Oropharynx- clear Neck- supple, no JVP Lymph- no cervical lymphadenopathy Lungs- Clear to ausculation bilaterally, normal work of breathing Heart- Irregular rate and rhythm, no murmurs, rubs or gallops, PMI not laterally displaced GI- soft, NT, ND, + BS Extremities- no clubbing, cyanosis, or edema MS- no significant deformity or atrophy Skin- no rash or lesion Psych- euthymic mood, full affect Neuro- strength and sensation are intact  EKG - not obtained today.  Echo- 09/18/13 - Left ventricle: The cavity size was normal. There was moderate concentric hypertrophy. Systolic function was normal. The estimated ejection fraction was in the range of 60% to 65%. Wall motion was normal; there were no regional wall motion abnormalities. Doppler parameters are consistent  with abnormal left ventricular relaxation (grade 1 diastolic dysfunction). There was no evidence of elevated ventricular filling pressure by Doppler parameters. - Aortic valve: Trileaflet; normal thickness leaflets. - Aortic root: The aortic root was normal in size. - Mitral valve: Structurally normal valve. There was no regurgitation. - Left atrium: The atrium was mildly dilated, 43 mm - Right ventricle: Systolic function was normal. - Tricuspid valve: There was no regurgitation. - Pulmonic valve: Structurally normal valve. - Pulmonary arteries: Systolic pressure was within the normal range. - Pericardium, extracardiac: There was no pericardial effusion.   Assessment and Plan:  1. Persistent afib  He has had recurrent symptomatic afib failing Multaq. Proceed to DCCV as scheduled 03/14/14. Continue to work on lifestyle factors, restorative sleep, exercise, weight loss, reduction of alcohol use.  Therapeutic strategies for afib including medicine and ablation were discussed in detail with the patient today. Risk, benefits, and alternatives to EP study and radiofrequency ablation for afib were also discussed in detail today. These risks include but are not limited to stroke, bleeding, vascular damage, tamponade, perforation, damage to the esophagus, lungs, and other structures, pulmonary vein stenosis, radiation exposure, worsening renal function, and death. The patient understands these risk and wishes to proceed. We will therefore proceed  with catheter ablation January 7,2016.  Continue eliquis stressing no missed doses.      Patient failed [facility] on 03/14/14  Set up for ablation 04/19/14  TEE ordered pre ablation to r/o LAA thrombus  Jenkins Rouge

## 2014-04-19 ENCOUNTER — Ambulatory Visit (HOSPITAL_COMMUNITY): Payer: PRIVATE HEALTH INSURANCE | Admitting: Anesthesiology

## 2014-04-19 ENCOUNTER — Encounter (HOSPITAL_COMMUNITY)
Admission: RE | Disposition: A | Discharge status: Home / Self Care | Payer: Self-pay | Source: Ambulatory Visit | Attending: Internal Medicine

## 2014-04-19 ENCOUNTER — Ambulatory Visit (HOSPITAL_COMMUNITY)
Admission: RE | Admit: 2014-04-19 | Discharge: 2014-04-20 | Disposition: A | Discharge status: Home / Self Care | Payer: PRIVATE HEALTH INSURANCE | Source: Ambulatory Visit | Attending: Internal Medicine | Admitting: Internal Medicine

## 2014-04-19 ENCOUNTER — Encounter (HOSPITAL_COMMUNITY): Payer: Self-pay | Admitting: Certified Registered Nurse Anesthetist

## 2014-04-19 DIAGNOSIS — J9811 Atelectasis: Secondary | ICD-10-CM | POA: Insufficient documentation

## 2014-04-19 DIAGNOSIS — R0602 Shortness of breath: Secondary | ICD-10-CM | POA: Insufficient documentation

## 2014-04-19 DIAGNOSIS — I10 Essential (primary) hypertension: Secondary | ICD-10-CM | POA: Insufficient documentation

## 2014-04-19 DIAGNOSIS — I481 Persistent atrial fibrillation: Secondary | ICD-10-CM | POA: Insufficient documentation

## 2014-04-19 DIAGNOSIS — I4819 Other persistent atrial fibrillation: Secondary | ICD-10-CM | POA: Diagnosis present

## 2014-04-19 DIAGNOSIS — I4891 Unspecified atrial fibrillation: Secondary | ICD-10-CM | POA: Diagnosis present

## 2014-04-19 HISTORY — PX: ATRIAL FIBRILLATION ABLATION: SHX5732

## 2014-04-19 HISTORY — PX: ATRIAL FIBRILLATION ABLATION: SHX5456

## 2014-04-19 LAB — BASIC METABOLIC PANEL
Anion gap: 4 — ABNORMAL LOW (ref 5–15)
BUN: 12 mg/dL (ref 6–23)
CHLORIDE: 105 meq/L (ref 96–112)
CO2: 27 mmol/L (ref 19–32)
CREATININE: 0.94 mg/dL (ref 0.50–1.35)
Calcium: 8.9 mg/dL (ref 8.4–10.5)
GFR calc non Af Amer: 87 mL/min — ABNORMAL LOW (ref >90)
Glucose, Bld: 112 mg/dL — ABNORMAL HIGH (ref 70–99)
Potassium: 3.8 mmol/L (ref 3.5–5.1)
Sodium: 136 mmol/L (ref 135–145)

## 2014-04-19 LAB — CBC
HCT: 45.1 % (ref 39.0–52.0)
HEMOGLOBIN: 15.5 g/dL (ref 13.0–17.0)
MCH: 30.3 pg (ref 26.0–34.0)
MCHC: 34.4 g/dL (ref 30.0–36.0)
MCV: 88.1 fL (ref 78.0–100.0)
PLATELETS: 155 10*3/uL (ref 150–400)
RBC: 5.12 MIL/uL (ref 4.22–5.81)
RDW: 12.5 % (ref 11.5–15.5)
WBC: 6 10*3/uL (ref 4.0–10.5)

## 2014-04-19 LAB — POCT ACTIVATED CLOTTING TIME
Activated Clotting Time: 264 seconds
Activated Clotting Time: 264 seconds
Activated Clotting Time: 264 seconds
Activated Clotting Time: 147 seconds

## 2014-04-19 SURGERY — ATRIAL FIBRILLATION ABLATION
Anesthesia: Monitor Anesthesia Care

## 2014-04-19 MED ORDER — BUPIVACAINE HCL (PF) 0.25 % IJ SOLN
INTRAMUSCULAR | Status: AC
  Filled 2014-04-19: qty 30

## 2014-04-19 MED ORDER — OFF THE BEAT BOOK
Freq: Once | Status: DC
  Filled 2014-04-19: qty 1

## 2014-04-19 MED ORDER — HYDROCODONE-ACETAMINOPHEN 5-325 MG PO TABS
ORAL_TABLET | ORAL | Status: AC
  Filled 2014-04-19: qty 1

## 2014-04-19 MED ORDER — DOBUTAMINE IN D5W 4-5 MG/ML-% IV SOLN
INTRAVENOUS | Status: AC
  Filled 2014-04-19: qty 250

## 2014-04-19 MED ORDER — ONDANSETRON HCL 4 MG/2ML IJ SOLN: 4.0000 mg | Freq: Four times a day (QID) | INTRAMUSCULAR | Status: DC | PRN

## 2014-04-19 MED ORDER — GLYCOPYRROLATE 0.2 MG/ML IJ SOLN
INTRAMUSCULAR | Status: DC | PRN
  Administered 2014-04-19: 0.4 mg via INTRAVENOUS

## 2014-04-19 MED ORDER — FENTANYL CITRATE 0.05 MG/ML IJ SOLN
INTRAMUSCULAR | Status: DC | PRN
  Administered 2014-04-19 (×2): 25 ug via INTRAVENOUS
  Administered 2014-04-19 (×2): 50 ug via INTRAVENOUS
  Administered 2014-04-19: 25 ug via INTRAVENOUS

## 2014-04-19 MED ORDER — ROCURONIUM BROMIDE 100 MG/10ML IV SOLN
INTRAVENOUS | Status: DC | PRN
  Administered 2014-04-19: 3 mg via INTRAVENOUS

## 2014-04-19 MED ORDER — APIXABAN 5 MG PO TABS
5.0000 mg | ORAL_TABLET | Freq: Two times a day (BID) | ORAL | Status: DC
  Administered 2014-04-19 – 2014-04-20 (×2): 5 mg via ORAL
  Filled 2014-04-19 (×3): qty 1

## 2014-04-19 MED ORDER — MIDAZOLAM HCL 5 MG/5ML IJ SOLN
INTRAMUSCULAR | Status: DC | PRN
  Administered 2014-04-19: 2 mg via INTRAVENOUS

## 2014-04-19 MED ORDER — SODIUM CHLORIDE 0.9 % IJ SOLN
3.0000 mL | Freq: Two times a day (BID) | INTRAMUSCULAR | Status: DC
  Administered 2014-04-20: 3 mL via INTRAVENOUS

## 2014-04-19 MED ORDER — SUCCINYLCHOLINE CHLORIDE 20 MG/ML IJ SOLN
INTRAMUSCULAR | Status: DC | PRN
  Administered 2014-04-19: 120 mg via INTRAVENOUS

## 2014-04-19 MED ORDER — PROTAMINE SULFATE 10 MG/ML IV SOLN
INTRAVENOUS | Status: DC | PRN
  Administered 2014-04-19: 30 mg via INTRAVENOUS

## 2014-04-19 MED ORDER — PROPOFOL 10 MG/ML IV BOLUS
INTRAVENOUS | Status: DC | PRN
  Administered 2014-04-19: 20 mg via INTRAVENOUS
  Administered 2014-04-19: 30 mg via INTRAVENOUS
  Administered 2014-04-19: 200 mg via INTRAVENOUS
  Administered 2014-04-19: 100 mg via INTRAVENOUS

## 2014-04-19 MED ORDER — HEPARIN SODIUM (PORCINE) 1000 UNIT/ML IJ SOLN
INTRAMUSCULAR | Status: DC | PRN
  Administered 2014-04-19 (×2): 3000 [IU] via INTRAVENOUS

## 2014-04-19 MED ORDER — NEOSTIGMINE METHYLSULFATE 10 MG/10ML IV SOLN
INTRAVENOUS | Status: DC | PRN
  Administered 2014-04-19: 2.5 mg via INTRAVENOUS

## 2014-04-19 MED ORDER — SODIUM CHLORIDE 0.9 % IJ SOLN: 3.0000 mL | INTRAMUSCULAR | Status: DC | PRN

## 2014-04-19 MED ORDER — HEPARIN SODIUM (PORCINE) 1000 UNIT/ML IJ SOLN
INTRAMUSCULAR | Status: AC
  Filled 2014-04-19: qty 1

## 2014-04-19 MED ORDER — PHENYLEPHRINE HCL 10 MG/ML IJ SOLN
INTRAMUSCULAR | Status: DC | PRN
  Administered 2014-04-19: 40 ug via INTRAVENOUS
  Administered 2014-04-19: 80 ug via INTRAVENOUS
  Administered 2014-04-19: 40 ug via INTRAVENOUS
  Administered 2014-04-19: 80 ug via INTRAVENOUS

## 2014-04-19 MED ORDER — SODIUM CHLORIDE 0.9 % IV SOLN: 250.0000 mL | INTRAVENOUS | Status: DC | PRN

## 2014-04-19 MED ORDER — LACTATED RINGERS IV SOLN
INTRAVENOUS | Status: DC | PRN
  Administered 2014-04-19 (×2): via INTRAVENOUS

## 2014-04-19 MED ORDER — ONDANSETRON HCL 4 MG/2ML IJ SOLN
INTRAMUSCULAR | Status: DC | PRN
  Administered 2014-04-19: 4 mg via INTRAVENOUS

## 2014-04-19 MED ORDER — DOBUTAMINE IN D5W 4-5 MG/ML-% IV SOLN
INTRAVENOUS | Status: DC | PRN
  Administered 2014-04-19: 20 ug/kg/min via INTRAVENOUS

## 2014-04-19 MED ORDER — ACETAMINOPHEN 325 MG PO TABS: 650.0000 mg | ORAL_TABLET | ORAL | Status: DC | PRN

## 2014-04-19 MED ORDER — LIDOCAINE HCL (CARDIAC) 10 MG/ML IV SOLN
INTRAVENOUS | Status: DC | PRN
  Administered 2014-04-19: 80 mg via INTRAVENOUS

## 2014-04-19 MED ORDER — HYDROCODONE-ACETAMINOPHEN 5-325 MG PO TABS
1.0000 | ORAL_TABLET | ORAL | Status: DC | PRN
  Administered 2014-04-19 (×2): 1 via ORAL
  Filled 2014-04-19 (×3): qty 2

## 2014-04-19 NOTE — Progress Notes (Addendum)
Site area: rt groin Site Prior to Removal:  Level 0 Pressure Applied For: 20 minutes Manual:   yes Patient Status During Pull: stable  Post Pull Site:  Level 0 Post Pull Instructions Given:  yes Post Pull Pulses Present: yes Dressing Applied:  tegaderm Bedrest begins @ 1140 Comments: no complications; IV saline locked

## 2014-04-19 NOTE — Progress Notes (Signed)
Resting quietly. Family visiting. Does not want to eat anything currently. Waiting on TCU bed assignment. Back discomfort eased a little.

## 2014-04-19 NOTE — Discharge Summary (Signed)
Physician Discharge Summary  Patient ID: Reginald [name] MRN: [id number] DOB/AGE: 07/03/1950 64 y.o.   Primary Cardiologist: Dr. Marlou Porch Electrophysiologist: Dr. [name]  Patient Profile: 64 y.o. male with a h/o HTN and persistent atrial fibrillation admitted for Afib ablation  Admit date: 04/19/2014 Discharge date: 04/20/2014  Admission Diagnoses: Persistent Atrial Fibrillation  Discharge Diagnoses:  Active Problems:   Atrial fibrillation   Persistent atrial fibrillation   Shortness of breath   Discharged Condition: stable  HPI: The patient is a 64 y/o male followed by Dr. Marlou Porch with a history of HTN and atrial fibrillation on Eliquis for anticoagulation. He first developed paroxsymal Afib in May 2014. He reports symptoms of fatigue and decreased exercise tolerance with this.He had recurrence in April 2015 and required cardioversion. He did well until June 2015. He had recurrence in afib. He underwent repeat cardioversion and was placed on Multaq. Unfortunately he has failed antiarrthymic therapy with Multaq despite full compliance. He had recurrence in December 2015 and underwent a third DCCV but attempt was unsuccessful. It was recommended that he undergo Afib ablation for rhythm control.   Hospital Course: The patient presented to [facility] on 04/19/14 for the planned procedure. Prior to the procedure, he underwent a TEE that was negative for LA thrombus. The procedure was performed by Dr. [name]. Rotational Angiography revealed a moderate sized left atrium with a short common segment to the LSPV/LIPV without evidence of pulmonary vein stenosis. He underwent successful electrical isolation and anatomical encircling of all four pulmonary veins with radiofrequency current. There were no inducible arrhythmias following ablation both on and off of dobutamine. He tolerated the procedure well. He was monitored overnight. He had no major complications. He did however note mild dyspnea the day  following the procedure and lung exam was slightly abnormal over the mid right lung field. Subsequently, he was given a dose of IV Lasix and a CXR was ordered.  CXR demonstrated no active disease. There was noted to be elevation of the right hemidiaphragm with right basilar atelectasis and mild degenerative changes of the lower thoracic spine. There was no evidence for PNA or pulmonary edema. Symptoms resolved after dose of lasix. BP remained stable. He was last seen and examined by Dr. [name] who determined he was stable for discharge home. His Multaq was discontinued. He was continued on Eliquis. PPI therapy with Protonix was also ordered on discharge. This is to be continued for 6 weeks. He will follow-up with Dr. [name] in 3 months.   Consults: None  Significant Diagnostic Studies: See EP procedure note for details  Treatments: See Hospital Course  Discharge Exam: Blood pressure 151/70, pulse 83, temperature 97.7 F (36.5 C), temperature source Oral, resp. rate 18, height 6' (1.829 m), weight 255 lb 1.2 oz (115.7 kg), SpO2 98 %.   Disposition: 01-Home or Self Care      Discharge Instructions    Diet - low sodium heart healthy    Complete by:  As directed      Increase activity slowly    Complete by:  As directed             Medication List    STOP taking these medications        MULTAQ 400 MG tablet  Generic drug:  dronedarone      TAKE these medications        CARTIA XT 120 MG 24 hr capsule  Generic drug:  diltiazem  TAKE ONE CAPSULE BY MOUTH EVERY DAY. NEED APPOINTMENT  ELIQUIS 5 MG Tabs tablet  Generic drug:  apixaban  TAKE 1 TABLET BY MOUTH TWICE DAILY     GLUCOSAMINE 1500 COMPLEX Caps  Take 1 capsule by mouth daily.     Magnesium 250 MG Tabs  Take 250 mg by mouth daily.     multivitamin with minerals Tabs tablet  Take 1 tablet by mouth daily.     pantoprazole 40 MG tablet  Commonly known as:  PROTONIX  Take 1 tablet (40 mg total) by mouth daily.       potassium gluconate 595 MG Tabs tablet  Take 595 mg by mouth daily.     tetrahydrozoline 0.05 % ophthalmic solution  Place 1 drop into both eyes as needed (dry eyes).     valsartan-hydrochlorothiazide 320-25 MG per tablet  Commonly known as:  DIOVAN-HCT  Take 1 tablet by mouth every morning.     vitamin C 500 MG tablet  Commonly known as:  ASCORBIC ACID  Take 500 mg by mouth daily.       Follow-up Information    Follow up with [name], MD.   Specialty:  Cardiology   Why:  our office will call you with an appointment for 3 month follow-up    Contact information:   [address] [address] [phone]       TIME SPENT ON DISCHARGE INCLUDING PHYSICIAN TIME: >30 MINUTES   Signed: Lyda Jester 04/20/2014, 4:47 PM  [name] MD

## 2014-04-19 NOTE — Anesthesia Preprocedure Evaluation (Addendum)
Anesthesia Evaluation  Patient identified by MRN, date of birth, ID band Patient awake    Reviewed: Allergy & Precautions, Patient's Chart, lab work & pertinent test results  History of Anesthesia Complications Negative for: history of anesthetic complications  Airway Mallampati: II  TM Distance: >3 FB Neck ROM: Full    Dental  (+) Teeth Intact, Dental Advisory Given   Pulmonary neg pulmonary ROS,    Pulmonary exam normal       Cardiovascular hypertension, + dysrhythmias Atrial Fibrillation Rhythm:Regular Rate:Normal  Moderate LVH EF 60%   Neuro/Psych negative neurological ROS  negative psych ROS   GI/Hepatic negative GI ROS, Neg liver ROS,   Endo/Other  Morbid obesity  Renal/GU negative Renal ROS     Musculoskeletal   Abdominal   Peds  Hematology   Anesthesia Other Findings   Reproductive/Obstetrics                            Anesthesia Physical Anesthesia Plan  ASA: III  Anesthesia Plan: General and MAC   Post-op Pain Management:    Induction: Intravenous  Airway Management Planned: Oral ETT and Simple Face Mask  Additional Equipment: Arterial line  Intra-op Plan:   Post-operative Plan: Extubation in OR  Informed Consent: I have reviewed the patients History and Physical, chart, labs and discussed the procedure including the risks, benefits and alternatives for the proposed anesthesia with the patient or authorized representative who has indicated his/her understanding and acceptance.   Dental advisory given  Plan Discussed with: CRNA, Anesthesiologist and Surgeon  Anesthesia Plan Comments:        Anesthesia Quick Evaluation

## 2014-04-19 NOTE — Op Note (Signed)
SURGEON:  [name], MD  PREPROCEDURE DIAGNOSES: 1. Persistent atrial fibrillation.  POSTPROCEDURE DIAGNOSES: 1. Persistent atrial fibrillation.  PROCEDURES: 1. Comprehensive electrophysiologic study. 2. Coronary sinus pacing and recording. 3. Three-dimensional mapping of atrial fibrillation  4. Ablation of atrial fibrillation  5. Intracardiac echocardiography. 6. Transseptal puncture of an intact septum. 7. Rotational Angiography with processing at an independent workstation 8. Arrhythmia induction with pacing with isuprel dobumtaine  INTRODUCTION:  FRANCISCO OSTROVSKY is a 64 y.o. male with a history of persistent atrial fibrillation who now presents for EP study and radiofrequency ablation.  The patient reports initially being diagnosed with atrial fibrillation after presenting with symptomatic palpitations and fatgiue. The patient reports increasing frequency and duration of atrial fibrillation since that time.  The patient has failed medical therapy with multaq.  The patient therefore presents today for catheter ablation of atrial fibrillation.  DESCRIPTION OF PROCEDURE:  Informed written consent was obtained, and the patient was brought to the electrophysiology lab in a fasting state.  The patient was adequately sedated with intravenous medications as outlined in the anesthesia report.  The patient's left and right groins were prepped and draped in the usual sterile fashion by the EP lab staff.  Using a percutaneous Seldinger technique, two 7-French and one 11-French hemostasis sheaths were placed into the right common femoral vein.  3 Dimensional Rotational Angiography: A 5 french pigtail catheter was introduced through the right common femoral vein and advanced into the inferior venocava.  3 demential rotational angiography was then performed by power injection of 100cc of nonionic contrast.  Reprocessing at an independent work station was then performed.   This demonstrated a  moderate sized left atrium with 4 separate pulmonary veins.  There was a short common segment to the LSPV and LIPV.  The superior PVs were large in size.  The inferior PVs were moderate in size.  There were no anomalous veins or significant abnormalities.  The pigtail catheter was then removed.  Catheter Placement:  A 7-French Biosense Webster Decapolar coronary sinus catheter was introduced through the right common femoral vein and advanced into the coronary sinus for recording and pacing from this location.  A 6-French quadripolar Josephson catheter was introduced through the right common femoral vein and advanced into the right ventricle for recording and pacing.  This catheter was then pulled back to the His bundle location.    Initial Measurements: The patient presented to the electrophysiology lab in sinus rhythm.  His PR interval measured 185 msec with a QRS duration of 105 msec and a QT interval of 411 msec.  The AH interval measured 86 msec and the HV interval measured 59 msec.     Intracardiac Echocardiography: A 10-French Biosense Webster AcuNav intracardiac echocardiography catheter was introduced through the left common femoral vein and advanced into the right atrium. Intracardiac echocardiography was performed of the left atrium, and a three-dimensional anatomical rendering of the left atrium was performed using CARTO sound technology.  The patient was noted to have a normal sized left atrium.  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 All 4 pulmonary veins were visualized.  There was a short common segment to the LSPV and LIPV.  The superior PVs were large in size.  The inferior PVs were moderate in size.  The left atrial appendage was visualized and did not reveal thrombus.   There was no evidence of pulmonary vein stenosis.   Transseptal Puncture: The middle right common femoral vein sheath was exchanged for an 8.5 [place] SL2  transseptal sheath and transseptal access was achieved in a  standard fashion using a Brockenbrough needle under biplane fluoroscopy with intracardiac echocardiography confirmation of the transseptal puncture.  Once transseptal access had been achieved, heparin was administered intravenously and intra- arterially in order to maintain an ACT of greater than 300 seconds throughout the procedure.   3D Mapping and Ablation: The His bundle catheter was removed and in its place a 3.5 mm [employer] Thermocool ablation catheter was advanced into the right atrium.  The transseptal sheath was pulled back into the IVC over a guidewire.  The ablation catheter was advanced across the transseptal hole using the wire as a guide.  The transseptal sheath was then re-advanced over the guidewire into the left atrium.  A duodecapolar Biosense Webster circular mapping catheter was introduced through the transseptal sheath and positioned over the mouth of all 4 pulmonary veins.  Three-dimensional electroanatomical mapping was performed using CARTO technology.  This demonstrated electrical activity within all four pulmonary veins at baseline. The patient underwent successful sequential electrical isolation and anatomical encircling of all four pulmonary veins using radiofrequency current with a circular mapping catheter as a guide.  A WACA approach was used.  Measurements Following Ablation: Following ablation, dobutamine was infused up to 20 mcg/kg/min with no inducible atrial fibrillation, atrial tachycardia, atrial flutter, or sustained PACs. In sinus rhythm with RR interval was 843 msec, with PR 193 msec, QRS 97 msec, and Qt 395 msec.  Following ablation the AH interval measured 75 msec with an HV interval of 43 msec. Ventricular pacing was performed, which revealed midline decremental VA conduction with a VA Wenckebach cycle length of 460 msec.  Rapid atrial pacing was performed, which revealed an AV Wenckebach cycle length of 340 msec. No arrhythmias were induced  with RAP down to a cycle length of 220 msec. Electroisolation was then again confirmed in all four pulmonary veins.  Pacing was performed along the ablation line which confirmed entrance and exit block.  The procedure was therefore considered completed.  All catheters were removed, and the sheaths were aspirated and flushed.  The patient was transferred to the recovery area for sheath removal per protocol. EBL<26ml.  A limited bedside transthoracic echocardiogram revealed no pericardial effusion.  There were no early apparent complications.  CONCLUSIONS: 1. Sinus rhythm upon presentation.   2. Rotational Angiography reveals a moderate sized left atrium with a short common segment to the LSPV/LIPV without evidence of pulmonary vein stenosis. 3. Successful electrical isolation and anatomical encircling of all four pulmonary veins with radiofrequency current. 4. No inducible arrhythmias following ablation both on and off of dobutamine 5. No early apparent complications.   Leira Regino,MD 10:40 AM 04/19/2014

## 2014-04-19 NOTE — Transfer of Care (Signed)
Immediate Anesthesia Transfer of Care Note  Patient: Reginald [name]  Procedure(s) Performed: Procedure(s): ATRIAL FIBRILLATION ABLATION (N/A)  Patient Location: Cath Lab  Anesthesia Type:General  Level of Consciousness: awake, alert , oriented and patient cooperative  Airway & Oxygen Therapy: Patient Spontanous Breathing and Patient connected to face mask oxygen  Post-op Assessment: Report given to PACU RN, Post -op Vital signs reviewed and stable and Patient moving all extremities X 4  Post vital signs: Reviewed and stable  Complications: No apparent anesthesia complications

## 2014-04-19 NOTE — H&P (Signed)
Primary Care Physician: Horton Finer, MD Referring Physician: Dr Karolee Ohs is a 64 y.o. male with a h/o HTN and persistent atrial fibrillation who presents today for ablation. He first developed afib in May 2014 while on the golf [place]. He reports symptoms of fatigue and decreased exercise tolerance with this. He underwent cardioversion in April and did well until June when he again developed afib while playing golf. He was cardioverted and placed on Multaq.  He had maintained sinus rhythm until Friday when he returned to afib. Does not know of any one trigger other than poor sleep the night before.Took one extra dose of cartia without any change. Continues to take multaq 400 mg bid. He reports fatigue when in afib. He does want to proceed with ablation, but not before christmas. He is requesting DCCV because he does not want to stay in afib that long. He has only missed one doe of eliquis and that was 3 weeks ago.   Today, he denies symptoms of , chest pain, orthopnea, PND, lower extremity edema, dizziness, presyncope, syncope, or neurologic sequela.   The patient is tolerating medications without difficulties and is otherwise without complaint today.   Past Medical History  Diagnosis Date  . Hypertension   . Pneumonia     8th grade, without recurrence  . Persistent atrial fibrillation 07/10/2012    cardioversion x 2  . Impaired fasting glucose   . Ureteral stone    Past Surgical History  Procedure Laterality Date  . Rotator cuff repair    . Tee without cardioversion N/A 07/14/2012    Procedure: TRANSESOPHAGEAL ECHOCARDIOGRAM (TEE); Surgeon: [name], MD; Location: [place]; Service: Cardiovascular; Laterality: N/A;  . Cardioversion N/A 07/14/2012    Procedure: CARDIOVERSION; Surgeon: [name], MD; Location: [facility] ENDOSCOPY; Service: Cardiovascular; Laterality: N/A;  . Tee without  cardioversion N/A 09/16/2012    Procedure: TRANSESOPHAGEAL ECHOCARDIOGRAM (TEE); Surgeon: [name], MD; Location: [facility] ENDOSCOPY; Service: Cardiovascular; Laterality: N/A;  . Cardioversion N/A 09/16/2012    Procedure: CARDIOVERSION; Surgeon: [name], MD; Location: [facility] ENDOSCOPY; Service: Cardiovascular; Laterality: N/A;  . Orif metacarpal fracture Right   . Colonoscopy with propofol N/A 01/31/2013    Procedure: COLONOSCOPY WITH PROPOFOL; Surgeon: [name], MD; Location: [facility] ENDOSCOPY; Service: Endoscopy; Laterality: N/A;    Current Outpatient Prescriptions  Medication Sig Dispense Refill  . CARTIA XT 120 MG 24 hr capsule TAKE ONE CAPSULE BY MOUTH EVERY DAY. NEED APPOITMENT FOR FURTHER REFILLS (Patient taking differently: TAKE ONE CAPSULE BY MOUTH EVERY DAY) 30 capsule 0  . ELIQUIS 5 MG TABS tablet TAKE 1 TABLET BY MOUTH TWICE DAILY 60 tablet 11  . Glucosamine-Chondroit-Vit C-Mn (GLUCOSAMINE 1500 COMPLEX) CAPS Take 1 capsule by mouth daily.    . Magnesium 250 MG TABS Take 250 mg by mouth daily.    . MULTAQ 400 MG tablet TAKE 1 TABLET BY MOUTH TWICE DAILY 60 tablet 6  . Multiple Vitamin (MULTIVITAMIN WITH MINERALS) TABS tablet Take 1 tablet by mouth daily.    . potassium gluconate 595 MG TABS tablet Take 595 mg by mouth daily.    . valsartan-hydrochlorothiazide (DIOVAN-HCT) 320-25 MG per tablet Take 1 tablet by mouth every morning.     . vitamin C (ASCORBIC ACID) 500 MG tablet Take 500 mg by mouth daily.     No current facility-administered medications for this visit.    Allergies  Allergen Reactions  . Lisinopril Cough    History   Social History  . Marital  Status: Married    Spouse Name: N/A    Number of Children: N/A  . Years of Education: N/A   Occupational History  . Not on file.   Social History Main Topics  . Smoking status: Never Smoker    . Smokeless tobacco: Not on file  . Alcohol Use: 1.8 oz/week    1 Glasses of wine, 1 Cans of beer, 1 Shots of liquor per week     Comment: 2-3 glasses of wine or liquor per day  . Drug Use: No  . Sexual Activity: Yes   Other Topics Concern  . Not on file   Social History Narrative   Lives in [place] with spouse. 2 children (ages 48 and 40).   Owns [employer]    Family History  Problem Relation Age of Onset  . Hypertension Mother     ROS- full review of systems per system review sheet and as as above.  Physical Exam: Filed Vitals:   04/19/14 0540  BP: 151/84  Pulse: 70  Temp: 97.9 F (36.6 C)  Resp: 18                      GEN- The patient is well appearing, alert and oriented x 3 today.  Head- normocephalic, atraumatic Eyes- Sclera clear, conjunctiva pink Ears- hearing intact Oropharynx- clear Neck- supple, no JVP Lymph- no cervical lymphadenopathy Lungs- Clear to ausculation bilaterally, normal work of breathing Heart- Irregular rate and rhythm, no murmurs, rubs or gallops, PMI not laterally displaced GI- soft, NT, ND, + BS Extremities- no clubbing, cyanosis, or edema MS- no significant deformity or atrophy Skin- no rash or lesion Psych- euthymic mood, full affect Neuro- strength and sensation are intact  EKG - not obtained today.  Echo- 09/18/13 - Left ventricle: The cavity size was normal. There was moderate concentric hypertrophy. Systolic function was normal. The estimated ejection fraction was in the range of 60% to 65%. Wall motion was normal; there were no regional wall motion abnormalities. Doppler parameters are consistent with abnormal left ventricular relaxation (grade 1 diastolic dysfunction). There was no evidence of elevated ventricular filling pressure by Doppler parameters. - Aortic valve: Trileaflet; normal thickness leaflets. - Aortic root: The aortic  root was normal in size. - Mitral valve: Structurally normal valve. There was no regurgitation. - Left atrium: The atrium was mildly dilated, 43 mm - Right ventricle: Systolic function was normal. - Tricuspid valve: There was no regurgitation. - Pulmonic valve: Structurally normal valve. - Pulmonary arteries: Systolic pressure was within the normal range. - Pericardium, extracardiac: There was no pericardial effusion.   Assessment and Plan:  1. Persistent afib  He has had recurrent symptomatic afib failing Multaq.  Therapeutic strategies for afib including medicine and ablation were discussed in detail with the patient today. Risk, benefits, and alternatives to EP study and radiofrequency ablation for afib were also discussed in detail today. These risks include but are not limited to stroke, bleeding, vascular damage, tamponade, perforation, damage to the esophagus, lungs, and other structures, pulmonary vein stenosis, worsening renal function, and death. The patient understands these risk and wishes to proceed.  We will therefore proceed with catheter ablation.

## 2014-04-19 NOTE — Anesthesia Postprocedure Evaluation (Signed)
Anesthesia Post Note  Patient: Reginald [name]  Procedure(s) Performed: Procedure(s) (LRB): ATRIAL FIBRILLATION ABLATION (N/A)  Anesthesia type: general  Patient location: PACU  Post pain: Pain level controlled  Post assessment: Patient's Cardiovascular Status Stable  Last Vitals:  Filed Vitals:   04/19/14 1145  BP: 134/70  Pulse: 74  Temp:   Resp: 23    Post vital signs: Reviewed and stable  Level of consciousness: sedated  Complications: No apparent anesthesia complications

## 2014-04-19 NOTE — Anesthesia Procedure Notes (Signed)
Procedure Name: Intubation Date/Time: 04/19/2014 7:54 AM Performed by: Rogers Blocker Pre-anesthesia Checklist: Patient identified, Emergency Drugs available, Suction available and Patient being monitored Patient Re-evaluated:Patient Re-evaluated prior to inductionOxygen Delivery Method: Circle system utilized Preoxygenation: Pre-oxygenation with 100% oxygen Intubation Type: IV induction Ventilation: Oral airway inserted - appropriate to patient size and Mask ventilation with difficulty Laryngoscope Size: Miller and 2 Grade View: Grade I Tube type: Oral Number of attempts: 1 Airway Equipment and Method: Stylet Placement Confirmation: ETT inserted through vocal cords under direct vision,  positive ETCO2,  CO2 detector and breath sounds checked- equal and bilateral Secured at: 23 cm Tube secured with: Tape Dental Injury: Teeth and Oropharynx as per pre-operative assessment

## 2014-04-19 NOTE — Progress Notes (Signed)
UR Completed Zarif Rathje Graves-Bigelow, RN,BSN [phone]  

## 2014-04-20 ENCOUNTER — Ambulatory Visit (HOSPITAL_COMMUNITY): Payer: PRIVATE HEALTH INSURANCE

## 2014-04-20 DIAGNOSIS — I481 Persistent atrial fibrillation: Secondary | ICD-10-CM

## 2014-04-20 DIAGNOSIS — R0602 Shortness of breath: Secondary | ICD-10-CM

## 2014-04-20 LAB — BASIC METABOLIC PANEL
ANION GAP: 4 — AB (ref 5–15)
BUN: 9 mg/dL (ref 6–23)
CALCIUM: 8.4 mg/dL (ref 8.4–10.5)
CO2: 28 mmol/L (ref 19–32)
CREATININE: 0.8 mg/dL (ref 0.50–1.35)
Chloride: 103 mEq/L (ref 96–112)
GFR calc Af Amer: >90 mL/min (ref >90)
GFR calc non Af Amer: >90 mL/min (ref >90)
Glucose, Bld: 108 mg/dL — ABNORMAL HIGH (ref 70–99)
POTASSIUM: 4 mmol/L (ref 3.5–5.1)
Sodium: 135 mmol/L (ref 135–145)

## 2014-04-20 MED ORDER — PANTOPRAZOLE SODIUM 40 MG PO TBEC: 40.0000 mg | DELAYED_RELEASE_TABLET | Freq: Every day | ORAL | Status: DC

## 2014-04-20 MED ORDER — FUROSEMIDE 10 MG/ML IJ SOLN
40.0000 mg | Freq: Once | INTRAMUSCULAR | Status: AC
  Administered 2014-04-20: 40 mg via INTRAVENOUS
  Filled 2014-04-20: qty 4

## 2014-04-20 NOTE — Progress Notes (Signed)
Patient Profile: 64 y.o. male with a h/o HTN and persistent atrial fibrillation admitted for Afib ablation. Day 1 s/p successful electrical isolation and anatomical encircling of all four pulmonary veins with radiofrequency current.  Subjective: No complaints. Denies any right anterior groin, flank or low back pain. No difficulties ambulating. No CP or palpitations. He notes mild, intermittent dyspnea (described as having to take deeper breaths)  Objective: Vital signs in last 24 hours: Temp:  [97.5 F (36.4 C)-98.1 F (36.7 C)] 97.7 F (36.5 C) (01/08 0805) Pulse Rate:  [63-87] 83 (01/08 0805) Resp:  [12-23] 18 (01/08 0805) BP: (110-151)/(62-82) 151/70 mmHg (01/08 0805) SpO2:  [89 %-98 %] 98 % (01/08 0805) Weight:  [255 lb 1.2 oz (115.7 kg)] 255 lb 1.2 oz (115.7 kg) (01/08 0452) Last BM Date: 04/19/14  Intake/Output from previous day: 01/07 0701 - 01/08 0700 In: 1240 [P.O.:240; I.V.:1000] Out: 20 [Blood:20] Intake/Output this shift: Total I/O In: 360 [P.O.:360] Out: 1000 [Urine:1000]  Medications Current Facility-Administered Medications  Medication Dose Route Frequency Provider Last Rate Last Dose  . 0.9 %  sodium chloride infusion  250 mL Intravenous PRN [name], MD      . acetaminophen (TYLENOL) tablet 650 mg  650 mg Oral Q4H PRN [name], MD      . apixaban (ELIQUIS) tablet 5 mg  5 mg Oral BID [name], MD   5 mg at 04/20/14 1004  . HYDROcodone-acetaminophen (NORCO/VICODIN) 5-325 MG per tablet 1-2 tablet  1-2 tablet Oral Q4H PRN [name], MD   1 tablet at 04/19/14 1258  . off the beat book   Does not apply Once [name], MD      . ondansetron [facility]) injection 4 mg  4 mg Intravenous Q6H PRN [name], MD      . sodium chloride 0.9 % injection 3 mL  3 mL Intravenous Q12H [name], MD   3 mL at 04/20/14 1005  . sodium chloride 0.9 % injection 3 mL  3 mL Intravenous PRN [name], MD        PE: General appearance: alert, cooperative  and no distress Neck: no carotid bruit and no JVD Lungs: crackles in mid right lobe otherwise CTA Heart: regular rate and rhythm, S1, S2 normal, no murmur, click, rub or gallop Extremities: no LEE Pulses: 2+ and symmetric Skin: warm and dry Neurologic: Grossly normal  Lab Results:   Recent Labs  04/19/14 0603  WBC 6.0  HGB 15.5  HCT 45.1  PLT 155   BMET  Recent Labs  04/19/14 0603 04/20/14 0423  NA 136 135  K 3.8 4.0  CL 105 103  CO2 27 28  GLUCOSE 112* 108*  BUN 12 9  CREATININE 0.94 0.80  CALCIUM 8.9 8.4     Assessment/Plan    Active Problems:   Atrial fibrillation   Persistent atrial fibrillation   Shortness of breath  1. S/P EP Study Followed by Afib Ablation: Day 1 s/p successful electrical isolation and anatomical encircling of all four pulmonary veins with radiofrequency current. There were no inducible arrhythmias following ablation both on and off of dobutamine. NSR on telemetry. No events overnight. Right femoral access site is stable w/o complication. Ambulating w/o difficulties. VSS. Per Dr. [name], will discontinue Multaq. He is to continue Xarelto for the time being. Discharge home today. F/u with Dr. [name] in 3 months.  2. Abnormal Lung Exam: Pt also noted sensation of needing to take in deeper breaths earlier this am.  He was given a 1 x dose of IV Lasix. CXR demonstrated no active disease. There was noted to be elevation of the right hemidiaphragm with right basilar atelectasis and mild degenerative changes lower thoracic spine. There was no evidence for PNA or pulmonary edema. He now feels better. BP is stable after dose of IV Lasix. No need for PO Lasix. Will just continue home valsartan-HCTZ.    Dispo: Home today. F/U in 3 months.     LOS: 1 day    Brittainy M. Rosita Fire, PA-C 04/20/2014 10:20 AM   I have seen, examined the patient, and reviewed the above assessment and plan.  Changes to above are made where necessary.  Pt with mild SOB.   Denies cough, fevers, chills, or other symptoms.  Course BS at the R base.  Will order chest Xray.  Also given lasix 40mg  IV x 1.  OK to discharge if CXR is ok.  Co Sign: [name], MD

## 2014-04-20 NOTE — Progress Notes (Signed)
Pt discharged home with wife via wc to car.  All discharge instructions and protonix prescription given.  Pt is chest pain free and no sob.  rhytnm is sinus.

## 2014-04-24 ENCOUNTER — Ambulatory Visit (INDEPENDENT_AMBULATORY_CARE_PROVIDER_SITE_OTHER): Payer: 59 | Admitting: Cardiology

## 2014-04-24 ENCOUNTER — Encounter: Payer: Self-pay | Admitting: Cardiology

## 2014-04-24 VITALS — BP 128/80 | HR 81 | Ht 72.0 in | Wt 259.0 lb

## 2014-04-24 DIAGNOSIS — Z7901 Long term (current) use of anticoagulants: Secondary | ICD-10-CM

## 2014-04-24 DIAGNOSIS — I480 Paroxysmal atrial fibrillation: Secondary | ICD-10-CM

## 2014-04-24 DIAGNOSIS — R0600 Dyspnea, unspecified: Secondary | ICD-10-CM

## 2014-04-24 NOTE — Progress Notes (Signed)
[facility]. [address], [place]  [address] Phone: [phone] Fax:  [phone]  Date:  04/24/2014   ID:  Reginald [name], DOB March 19, 1951, MRN [id number]  PCP:  Dorian Heckle, MD   History of Present Illness: Reginald [name] is a 64 y.o. male with history of persistent atrial fibrillation with ablation by Dr. [name] on 04/19/14.  In April 2015 underwent cardioversion after experiencing atrial fibrillation on of course. Atrial for ablation returned in June. Placed on Multaq at that time. Maintained rhythm until January 2016. Poor sleep may have been triggered. Continues with Multaq 400 mg twice a day. Main symptom is fatigue with atrial fibrillation. Eliquis, anticoagulation.   Wt Readings from Last 3 Encounters:  04/24/14 259 lb (117.482 kg)  04/20/14 255 lb 1.2 oz (115.7 kg)  03/14/14 258 lb (117.028 kg)     Past Medical History  Diagnosis Date  . Hypertension   . Persistent atrial fibrillation 07/10/2012    cardioversion x 2  . Impaired fasting glucose   . Ureteral stone   . Pneumonia     8th grade, without recurrence    Past Surgical History  Procedure Laterality Date  . Shoulder arthroscopy w/ rotator cuff repair Bilateral   . Tee without cardioversion N/A 07/14/2012    Procedure: TRANSESOPHAGEAL ECHOCARDIOGRAM (TEE);  Surgeon: [name], MD;  Location: [place];  Service: Cardiovascular;  Laterality: N/A;  . Cardioversion N/A 07/14/2012    Procedure: CARDIOVERSION;  Surgeon: [name], MD;  Location: [facility] ENDOSCOPY;  Service: Cardiovascular;  Laterality: N/A;  . Tee without cardioversion N/A 09/16/2012    Procedure: TRANSESOPHAGEAL ECHOCARDIOGRAM (TEE);  Surgeon: [name], MD;  Location: [facility] ENDOSCOPY;  Service: Cardiovascular;  Laterality: N/A;  . Cardioversion N/A 09/16/2012    Procedure: CARDIOVERSION;  Surgeon: [name], MD;  Location: [facility] ENDOSCOPY;  Service: Cardiovascular;  Laterality: N/A;  . Orif metacarpal fracture Right   .  Colonoscopy with propofol N/A 01/31/2013    Procedure: COLONOSCOPY WITH PROPOFOL;  Surgeon: [name], MD;  Location: [facility] ENDOSCOPY;  Service: Endoscopy;  Laterality: N/A;  . Cardioversion N/A 03/14/2014    Procedure: CARDIOVERSION;  Surgeon: [name], MD;  Location: [place];  Service: Cardiovascular;  Laterality: N/A;  . Tee without cardioversion N/A 04/18/2014    Procedure: TRANSESOPHAGEAL ECHOCARDIOGRAM (TEE);  Surgeon: [name], MD;  Location: [facility] ENDOSCOPY;  Service: Cardiovascular;  Laterality: N/A;  . Atrial fibrillation ablation  04/19/2014  . Fracture surgery    . Atrial fibrillation ablation N/A 04/19/2014    Procedure: ATRIAL FIBRILLATION ABLATION;  Surgeon: [name], MD;  Location: [facility] CATH LAB;  Service: Cardiovascular;  Laterality: N/A;    Current Outpatient Prescriptions  Medication Sig Dispense Refill  . CARTIA XT 120 MG 24 hr capsule TAKE ONE CAPSULE BY MOUTH EVERY DAY. NEED APPOINTMENT 30 capsule 0  . ELIQUIS 5 MG TABS tablet TAKE 1 TABLET BY MOUTH TWICE DAILY 60 tablet 11  . Glucosamine-Chondroit-Vit C-Mn (GLUCOSAMINE 1500 COMPLEX) CAPS Take 1 capsule by mouth daily.    . imiquimod (ALDARA) 5 % cream     . Magnesium 250 MG TABS Take 250 mg by mouth daily.    . Multiple Vitamin (MULTIVITAMIN WITH MINERALS) TABS tablet Take 1 tablet by mouth daily.    . pantoprazole (PROTONIX) 40 MG tablet Take 1 tablet (40 mg total) by mouth daily. 45 tablet 0  . potassium gluconate 595 MG TABS tablet Take 595 mg by  mouth daily.    . valsartan-hydrochlorothiazide (DIOVAN-HCT) 320-25 MG per tablet Take 1 tablet by mouth every morning.     . vitamin C (ASCORBIC ACID) 500 MG tablet Take 500 mg by mouth daily.     No current facility-administered medications for this visit.    Allergies:    Allergies  Allergen Reactions  . Lisinopril Cough    Social History:  The patient  reports that he has never smoked. He has never used smokeless tobacco. He reports that he drinks  about 8.4 oz of alcohol per week. He reports that he does not use illicit drugs.   Family History  Problem Relation Age of Onset  . Hypertension Mother     ROS:  Please see the history of present illness.   Denies any fevers, chills, orthopnea, PND, no bleeding   All other systems reviewed and negative.   PHYSICAL EXAM: VS:  BP 128/80 mmHg  Pulse 81  Ht 6' (1.829 m)  Wt 259 lb (117.482 kg)  BMI 35.12 kg/m2 Well nourished, well developed, in no acute distress HEENT: normal, [place]/AT, EOMI Neck: no JVD, normal carotid upstroke, no bruit Cardiac:  normal S1, S2; RRR; no murmur Lungs:  clear to auscultation bilaterally, no wheezing, rhonchi or rales Abd: soft, nontender, no hepatomegaly, no bruits Ext: no edema, 2+ distal pulses Skin: warm and dry GU: deferred Neuro: no focal abnormalities noted, AAO x 3  EKG:   04/20/14-sinus rhythm, 68, no other abnormalities   Echocardiogram, transesophageal-04/18/14-normal ejection fraction, septal aneurysm.  ASSESSMENT AND PLAN:  1. Paroxysmal atrial fibrillation-ablation by Dr. [name] 04/19/14. Doing well. Sinus rhythm. Eliquis. Multaq. 2. Dyspnea - mild, subjective, CXR with elevated right hemidiaphragm. This seems to be new when compared to scout film from abdominal CT scan in 2013. I will repeat in one month chest x-ray PA and lateral. Perhaps stunning of phrenic nerve during ablation. Has symptoms that are different when laying on left versus right side. The symptoms seem to be improving. Hopefully this will improve over time. 3. We will see back in 4 months. We will let him know results of chest x-ray. He will be seeing Dr. [name] back in 3 months.  Signed, [name], MD [facility]  04/24/2014 4:29 PM

## 2014-04-24 NOTE — Patient Instructions (Signed)
The current medical regimen is effective;  continue present plan and medications.  Please repeat a chest XR in 1 month.  You may have this completed at [facility].  Follow up in 4 months with Dr Marlou Porch.  Thank you for choosing [facility]!!

## 2014-05-10 ENCOUNTER — Other Ambulatory Visit: Payer: Self-pay | Admitting: Cardiology

## 2014-05-29 ENCOUNTER — Telehealth: Payer: Self-pay | Admitting: Cardiology

## 2014-05-29 NOTE — Telephone Encounter (Signed)
Follow up  Pt called states that he will need a February appt. Advised pt no appts available. Placed him on the waiting list. Pt request to have this message sent to nurse for a work in. Please assist

## 2014-05-29 NOTE — Telephone Encounter (Signed)
Pt c/o some SOB and feeling like he is having occasion skipped heart beats.  He is also due for a repeat CXR per Dr Marlou Porch' OV note.  appt scheduled for pt to f/u as he requests.  Pt will call back if s/s get worse before his appointment.

## 2014-06-06 ENCOUNTER — Ambulatory Visit (INDEPENDENT_AMBULATORY_CARE_PROVIDER_SITE_OTHER): Payer: 59 | Admitting: Cardiology

## 2014-06-06 ENCOUNTER — Encounter: Payer: Self-pay | Admitting: Cardiology

## 2014-06-06 VITALS — BP 118/68 | HR 70 | Ht 72.0 in | Wt 254.8 lb

## 2014-06-06 DIAGNOSIS — I480 Paroxysmal atrial fibrillation: Secondary | ICD-10-CM

## 2014-06-06 DIAGNOSIS — R0602 Shortness of breath: Secondary | ICD-10-CM

## 2014-06-06 NOTE — Progress Notes (Signed)
[place] [address], [address] [place],   [address] Phone: [phone] Fax:  [phone]  Date:  06/06/2014   ID:  Reginald [name], DOB [birth year], MRN [id number]  PCP:  Dorian Heckle, MD   History of Present Illness: Reginald [name] is a 64 y.o. male with history of persistent atrial fibrillation with ablation by Dr. [name] on 04/19/14.  In April 2015 underwent cardioversion after experiencing atrial fibrillation on of course. Atrial for ablation returned in June. Placed on Multaq at that time. Maintained rhythm until January 2016. Poor sleep may have been triggered. Continues with Multaq 400 mg twice a day. Main symptom is fatigue with atrial fibrillation. Eliquis, anticoagulation.  CXR - elevated hemidiaphragm.  06/06/14-After beach trip felt a few skipped beats.  This worried him. He may have felt some increased shortness of breath during that episode. Overall however, he is steadily improving with less and less shortness of breath. He still has some sensation of slightly worse breathing when laying on left versus right.   Wt Readings from Last 3 Encounters:  06/06/14 254 lb 12.8 oz (115.577 kg)  04/24/14 259 lb (117.482 kg)  04/20/14 255 lb 1.2 oz (115.7 kg)     Past Medical History  Diagnosis Date  . Hypertension   . Persistent atrial fibrillation 07/10/2012    cardioversion x 2  . Impaired fasting glucose   . Ureteral stone   . Pneumonia     8th grade, without recurrence    Past Surgical History  Procedure Laterality Date  . Shoulder arthroscopy w/ rotator cuff repair Bilateral   . Tee without cardioversion N/A 07/14/2012    Procedure: TRANSESOPHAGEAL ECHOCARDIOGRAM (TEE);  Surgeon: [name], MD;  Location: [place];  Service: Cardiovascular;  Laterality: N/A;  . Cardioversion N/A 07/14/2012    Procedure: CARDIOVERSION;  Surgeon: [name], MD;  Location: [facility] ENDOSCOPY;  Service: Cardiovascular;  Laterality: N/A;  . Tee without cardioversion N/A  09/16/2012    Procedure: TRANSESOPHAGEAL ECHOCARDIOGRAM (TEE);  Surgeon: [name], MD;  Location: [facility] ENDOSCOPY;  Service: Cardiovascular;  Laterality: N/A;  . Cardioversion N/A 09/16/2012    Procedure: CARDIOVERSION;  Surgeon: [name], MD;  Location: [facility] ENDOSCOPY;  Service: Cardiovascular;  Laterality: N/A;  . Orif metacarpal fracture Right   . Colonoscopy with propofol N/A 01/31/2013    Procedure: COLONOSCOPY WITH PROPOFOL;  Surgeon: [name], MD;  Location: [facility] ENDOSCOPY;  Service: Endoscopy;  Laterality: N/A;  . Cardioversion N/A 03/14/2014    Procedure: CARDIOVERSION;  Surgeon: [name], MD;  Location: [place];  Service: Cardiovascular;  Laterality: N/A;  . Tee without cardioversion N/A 04/18/2014    Procedure: TRANSESOPHAGEAL ECHOCARDIOGRAM (TEE);  Surgeon: [name], MD;  Location: [facility] ENDOSCOPY;  Service: Cardiovascular;  Laterality: N/A;  . Atrial fibrillation ablation  04/19/2014  . Fracture surgery    . Atrial fibrillation ablation N/A 04/19/2014    Procedure: ATRIAL FIBRILLATION ABLATION;  Surgeon: [name], MD;  Location: [facility] CATH LAB;  Service: Cardiovascular;  Laterality: N/A;    Current Outpatient Prescriptions  Medication Sig Dispense Refill  . diltiazem (CARTIA XT) 120 MG 24 hr capsule Take 1 capsule (120 mg total) by mouth daily. 30 capsule 3  . ELIQUIS 5 MG TABS tablet TAKE 1 TABLET BY MOUTH TWICE DAILY 60 tablet 11  . Glucosamine-Chondroit-Vit C-Mn (GLUCOSAMINE 1500 COMPLEX) CAPS Take 1 capsule by mouth daily.    . imiquimod (ALDARA) 5 % cream     .  Multiple Vitamin (MULTIVITAMIN WITH MINERALS) TABS tablet Take 1 tablet by mouth daily.    . pantoprazole (PROTONIX) 40 MG tablet Take 1 tablet (40 mg total) by mouth daily. 45 tablet 0  . valsartan-hydrochlorothiazide (DIOVAN-HCT) 320-25 MG per tablet Take 1 tablet by mouth every morning.     . vitamin C (ASCORBIC ACID) 500 MG tablet Take 500 mg by mouth daily.     No current facility-administered  medications for this visit.    Allergies:    Allergies  Allergen Reactions  . Lisinopril Cough    Social History:  The patient  reports that he has never smoked. He has never used smokeless tobacco. He reports that he drinks about 8.4 oz of alcohol per week. He reports that he does not use illicit drugs.   Family History  Problem Relation Age of Onset  . Hypertension Mother     ROS:  Please see the history of present illness.   Denies any fevers, chills, orthopnea, PND, no bleeding   All other systems reviewed and negative.   PHYSICAL EXAM: VS:  BP 118/68 mmHg  Pulse 70  Ht 6' (1.829 m)  Wt 254 lb 12.8 oz (115.577 kg)  BMI 34.55 kg/m2  SpO2 98% Well nourished, well developed, in no acute distress HEENT: normal, [place]/AT, EOMI Neck: no JVD, normal carotid upstroke, no bruit Cardiac:  normal S1, S2; RRR; no murmur Lungs:  clear to auscultation bilaterally, no wheezing, rhonchi or rales Abd: soft, nontender, no hepatomegaly, no bruits Ext: no edema, 2+ distal pulses Skin: warm and dry GU: deferred Neuro: no focal abnormalities noted, AAO x 3  EKG:   04/20/14-sinus rhythm, 68, no other abnormalities   Echocardiogram, transesophageal-04/18/14-normal ejection fraction, septal aneurysm. Chest x-ray: Elevated right hemidiaphragm.  ASSESSMENT AND PLAN:  1. Paroxysmal atrial fibrillation-ablation by Dr. [name] 04/19/14. Doing well. Sinus rhythm. Eliquis. Multaq. Rare palpitations. They seem to accompany lack of sleep. 2. Dyspnea - mild, subjective, CXR with elevated right hemidiaphragm. This seems to be new when compared to scout film from abdominal CT scan in 2013. I will repeat chest x-ray PA and lateral. Perhaps stunning of phrenic nerve during ablation. Has symptoms that are different when laying on left versus right side. The symptoms seem to be improving. Hopefully this will improve over time. If necessary, we can always have consultation with pulmonary for their evaluation. 3. We will  see back in 6 months. We will let him know results of chest x-ray. He will be seeing Dr. [name] back in 1 months.  Signed, [name], MD [facility]  06/06/2014 1:46 PM

## 2014-06-06 NOTE — Patient Instructions (Signed)
**Note De-Identified Reginald [name] Obfuscation** Your physician recommends that you continue on your current medications as directed. Please refer to the Current Medication list given to you today.  A chest x-ray takes a picture of the organs and structures inside the chest, including the heart, lungs, and blood vessels. This test can show several things, including, whether the heart is enlarges; whether fluid is building up in the lungs; and whether pacemaker / defibrillator leads are still in place.  Your physician wants you to follow-up in: 6 months. You will receive a reminder letter in the mail two months in advance. If you don't receive a letter, please call our office to schedule the follow-up appointment.

## 2014-06-18 ENCOUNTER — Ambulatory Visit
Admission: RE | Admit: 2014-06-18 | Discharge: 2014-06-18 | Disposition: A | Discharge status: Home / Self Care | Payer: 59 | Source: Ambulatory Visit | Attending: Cardiology | Admitting: Cardiology

## 2014-06-18 DIAGNOSIS — R0602 Shortness of breath: Secondary | ICD-10-CM

## 2014-07-26 ENCOUNTER — Ambulatory Visit (INDEPENDENT_AMBULATORY_CARE_PROVIDER_SITE_OTHER): Payer: 59 | Admitting: Internal Medicine

## 2014-07-26 ENCOUNTER — Encounter: Payer: Self-pay | Admitting: Internal Medicine

## 2014-07-26 VITALS — BP 124/82 | HR 65 | Ht 72.0 in | Wt 247.0 lb

## 2014-07-26 DIAGNOSIS — R0602 Shortness of breath: Secondary | ICD-10-CM

## 2014-07-26 DIAGNOSIS — I480 Paroxysmal atrial fibrillation: Secondary | ICD-10-CM

## 2014-07-26 DIAGNOSIS — I10 Essential (primary) hypertension: Secondary | ICD-10-CM

## 2014-07-26 NOTE — Patient Instructions (Signed)
Your physician recommends that you schedule a follow-up appointment in: 3 months with Dr [name]  Your physician has recommended you make the following change in your medication:  1) Stop Diltiazem

## 2014-07-26 NOTE — Progress Notes (Signed)
Electrophysiology Office Note   Date:  07/26/2014   ID:  Reginald [name], DOB 26-Oct-1950, MRN [id number]  PCP:  Dorian Heckle, MD  Cardiologist:  Dr Marlou Porch Primary Electrophysiologist: [name], MD    Chief Complaint  Patient presents with  . Atrial Fibrillation     History of Present Illness: Reginald [name] is a 64 y.o. male who presents today for electrophysiology evaluation.   Doing well s/p ablation.  He did have some SOB early which is improved.  He denies any other procedure related concerns.  Maintaining sinus rhythm.  Today, he denies symptoms of palpitations, chest pain,  orthopnea, PND, lower extremity edema, claudication, dizziness, presyncope, syncope, bleeding, or neurologic sequela. The patient is tolerating medications without difficulties and is otherwise without complaint today.    Past Medical History  Diagnosis Date  . Hypertension   . Persistent atrial fibrillation 07/10/2012    cardioversion x 2  . Impaired fasting glucose   . Ureteral stone   . Pneumonia     8th grade, without recurrence   Past Surgical History  Procedure Laterality Date  . Shoulder arthroscopy w/ rotator cuff repair Bilateral   . Tee without cardioversion N/A 07/14/2012    Procedure: TRANSESOPHAGEAL ECHOCARDIOGRAM (TEE);  Surgeon: [name], MD;  Location: [place];  Service: Cardiovascular;  Laterality: N/A;  . Cardioversion N/A 07/14/2012    Procedure: CARDIOVERSION;  Surgeon: [name], MD;  Location: [facility] ENDOSCOPY;  Service: Cardiovascular;  Laterality: N/A;  . Tee without cardioversion N/A 09/16/2012    Procedure: TRANSESOPHAGEAL ECHOCARDIOGRAM (TEE);  Surgeon: [name], MD;  Location: [facility] ENDOSCOPY;  Service: Cardiovascular;  Laterality: N/A;  . Cardioversion N/A 09/16/2012    Procedure: CARDIOVERSION;  Surgeon: [name], MD;  Location: [facility] ENDOSCOPY;  Service: Cardiovascular;  Laterality: N/A;  . Orif metacarpal fracture Right   . Colonoscopy with propofol N/A  01/31/2013    Procedure: COLONOSCOPY WITH PROPOFOL;  Surgeon: [name], MD;  Location: [facility] ENDOSCOPY;  Service: Endoscopy;  Laterality: N/A;  . Cardioversion N/A 03/14/2014    Procedure: CARDIOVERSION;  Surgeon: [name], MD;  Location: [place];  Service: Cardiovascular;  Laterality: N/A;  . Tee without cardioversion N/A 04/18/2014    Procedure: TRANSESOPHAGEAL ECHOCARDIOGRAM (TEE);  Surgeon: [name], MD;  Location: [facility] ENDOSCOPY;  Service: Cardiovascular;  Laterality: N/A;  . Atrial fibrillation ablation  04/19/2014  . Fracture surgery    . Atrial fibrillation ablation N/A 04/19/2014    Procedure: ATRIAL FIBRILLATION ABLATION;  Surgeon: [name], MD;  Location: [facility] CATH LAB;  Service: Cardiovascular;  Laterality: N/A;     Current Outpatient Prescriptions  Medication Sig Dispense Refill  . diltiazem (CARTIA XT) 120 MG 24 hr capsule Take 1 capsule (120 mg total) by mouth daily. 30 capsule 3  . ELIQUIS 5 MG TABS tablet TAKE 1 TABLET BY MOUTH TWICE DAILY 60 tablet 11  . Glucosamine-Chondroit-Vit C-Mn (GLUCOSAMINE 1500 COMPLEX) CAPS Take 1 capsule by mouth daily.    . Multiple Vitamin (MULTIVITAMIN WITH MINERALS) TABS tablet Take 1 tablet by mouth daily.    . valsartan-hydrochlorothiazide (DIOVAN-HCT) 320-25 MG per tablet Take 1 tablet by mouth every morning.     . vitamin C (ASCORBIC ACID) 500 MG tablet Take 500 mg by mouth daily.    . imiquimod (ALDARA) 5 % cream Apply 1 application topically daily.      No current facility-administered medications for this visit.    Allergies:   Lisinopril   Social History:  The patient  reports that he has never smoked. He has never used smokeless tobacco. He reports that he drinks about 8.4 oz of alcohol per week. He reports that he does not use illicit drugs.   Family History:  The patient's  family history includes Hypertension in his mother.    ROS:  Please see the history of present illness.   All other systems are reviewed and  negative.    PHYSICAL EXAM: VS:  BP 124/82 mmHg  Pulse 65  Ht 6' (1.829 m)  Wt 247 lb (112.038 kg)  BMI 33.49 kg/m2 , BMI Body mass index is 33.49 kg/(m^2). GEN: Well nourished, well developed, in no acute distress HEENT: normal Neck: no JVD, carotid bruits, or masses Cardiac: RRR; no murmurs, rubs, or gallops,no edema  Respiratory:  clear to auscultation bilaterally, normal work of breathing, + adequate diaphragmatic excursion with percussion on exam GI: soft, nontender, nondistended, + BS MS: no deformity or atrophy Skin: warm and dry  Neuro:  Strength and sensation are intact Psych: euthymic mood, full affect  EKG:  EKG is ordered today. The ekg ordered today shows sinus rhythm   Recent Labs: 04/19/2014: Hemoglobin 15.5; Platelets 155 04/20/2014: BUN 9; Creatinine 0.80; Potassium 4.0; Sodium 135    Lipid Panel  No results found for: CHOL, TRIG, HDL, CHOLHDL, VLDL, LDLCALC, LDLDIRECT   Wt Readings from Last 3 Encounters:  07/26/14 247 lb (112.038 kg)  06/06/14 254 lb 12.8 oz (115.577 kg)  04/24/14 259 lb (117.482 kg)      Other studies Reviewed: Additional studies/ records that were reviewed today include: CXR is personally reviewed  Review of the above records today demonstrates: modest R hemidiaphragmatic elevation   ASSESSMENT AND PLAN:  1.  afib Doing well s/p ablation chads2vasc score is 1.  We discussed anticoagulation vs asa today.  He is clear that he would prefer to continue eliquis for now Stop diltiazem  2. R elevated hemidiaphragm Slightly elevated on CT in 2015.  On exam today, appears improved Will repeat CXR in 3 months  3. HTN Stable No change required today  4. Overweight He has lost 12 lbs since January   Current medicines are reviewed at length with the patient today.   The patient does not have concerns regarding his medicines.  The following changes were made today:  none   Follow-up: return to see me in 3  months   Signed, [name], MD  07/26/2014 10:39 AM     [facility] [address] [place] [address] [phone] (office) [phone] (fax)

## 2014-08-10 ENCOUNTER — Telehealth: Payer: Self-pay | Admitting: Internal Medicine

## 2014-08-10 NOTE — Telephone Encounter (Signed)
Error

## 2014-08-21 ENCOUNTER — Telehealth: Payer: Self-pay | Admitting: Cardiology

## 2014-08-21 NOTE — Telephone Encounter (Signed)
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  States it is usually a 24 hr monitor and a letter.  Advised I will review it with Dr Marlou Porch as soon as I see it.  Also wants to let Claiborne Billings know that he has several stressful event coming up and is going to be traveling a lot in the next few months.  He said Dr [name] had wanted to change some of his medications but he wants to wait until after he is seen in July before trying to make any changes.  Advised I will forward this information to [facility] and Dr [name].

## 2014-08-21 NOTE — Telephone Encounter (Signed)
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  He would not tell me what he wanted regarding his medication

## 2014-08-21 NOTE — Telephone Encounter (Signed)
lmom for patient to return my call in regards to medication question

## 2014-08-22 NOTE — Telephone Encounter (Signed)
Left another message for patient to return my call.

## 2014-08-23 ENCOUNTER — Encounter: Payer: Self-pay | Admitting: Internal Medicine

## 2014-08-23 MED ORDER — DILTIAZEM HCL ER COATED BEADS 120 MG PO CP24: 120.0000 mg | ORAL_CAPSULE | Freq: Every day | ORAL | Status: DC

## 2014-08-23 NOTE — Telephone Encounter (Signed)
This encounter was created in error - please disregard.

## 2014-08-23 NOTE — Telephone Encounter (Signed)
New message     [facility] call

## 2014-08-23 NOTE — Telephone Encounter (Signed)
Left patient another message for patient to return my call

## 2014-08-23 NOTE — Telephone Encounter (Signed)
Patient called and he says he wants to stay on Diltiazem 120 mg daily until he comes back as he is going to be out of town quite a bit and worried.  He will stay on until he sees Dr [name] in July then stop

## 2014-08-27 ENCOUNTER — Encounter: Payer: Self-pay | Admitting: Cardiology

## 2014-08-29 ENCOUNTER — Other Ambulatory Visit: Payer: Self-pay | Admitting: *Deleted

## 2014-08-29 DIAGNOSIS — I4891 Unspecified atrial fibrillation: Secondary | ICD-10-CM

## 2014-09-19 ENCOUNTER — Ambulatory Visit (INDEPENDENT_AMBULATORY_CARE_PROVIDER_SITE_OTHER): Payer: 59

## 2014-09-19 DIAGNOSIS — I4891 Unspecified atrial fibrillation: Secondary | ICD-10-CM

## 2014-09-26 ENCOUNTER — Telehealth: Payer: Self-pay | Admitting: Cardiology

## 2014-09-26 NOTE — Telephone Encounter (Signed)
New message      Want results of 24hr monitor

## 2014-09-26 NOTE — Telephone Encounter (Signed)
Reviewed results with pt who states understanding. 

## 2014-11-05 ENCOUNTER — Ambulatory Visit (INDEPENDENT_AMBULATORY_CARE_PROVIDER_SITE_OTHER): Payer: 59 | Admitting: Internal Medicine

## 2014-11-05 ENCOUNTER — Encounter: Payer: Self-pay | Admitting: Internal Medicine

## 2014-11-05 VITALS — BP 144/80 | HR 76 | Ht 72.0 in | Wt 238.6 lb

## 2014-11-05 DIAGNOSIS — I10 Essential (primary) hypertension: Secondary | ICD-10-CM | POA: Diagnosis not present

## 2014-11-05 DIAGNOSIS — I480 Paroxysmal atrial fibrillation: Secondary | ICD-10-CM

## 2014-11-05 NOTE — Patient Instructions (Signed)
Medication Instructions:  Your physician has recommended you make the following change in your medication:  1) Stop Eliquis   Labwork: None ordered  Testing/Procedures: None ordered  Follow-Up: Your physician wants you to follow-up in: 6 months with Dr [name] Dennis Bast will receive a reminder letter in the mail two months in advance. If you don't receive a letter, please call our office to schedule the follow-up appointment.   Any Other Special Instructions Will Be Listed Below (If Applicable).

## 2014-11-05 NOTE — Progress Notes (Signed)
Electrophysiology Office Note   Date:  11/05/2014   ID:  Reginald [name], DOB 1950-12-22, MRN [id number]  PCP:  Dorian Heckle, MD  Cardiologist:  Dr Marlou Porch Primary Electrophysiologist: [name], MD    Chief Complaint  Patient presents with  . PAF     History of Present Illness: Reginald [name] is a 64 y.o. male who presents today for electrophysiology evaluation.   Doing well s/p ablation.  Maintaining sinus rhythm.  SOB is resolved.  Today, he denies symptoms of palpitations, chest pain,  orthopnea, PND, lower extremity edema, claudication, dizziness, presyncope, syncope, bleeding, or neurologic sequela. The patient is tolerating medications without difficulties and is otherwise without complaint today.    Past Medical History  Diagnosis Date  . Hypertension   . Persistent atrial fibrillation 07/10/2012    cardioversion x 2  . Impaired fasting glucose   . Ureteral stone   . Pneumonia     8th grade, without recurrence   Past Surgical History  Procedure Laterality Date  . Shoulder arthroscopy w/ rotator cuff repair Bilateral   . Tee without cardioversion N/A 07/14/2012    Procedure: TRANSESOPHAGEAL ECHOCARDIOGRAM (TEE);  Surgeon: [name], MD;  Location: [place];  Service: Cardiovascular;  Laterality: N/A;  . Cardioversion N/A 07/14/2012    Procedure: CARDIOVERSION;  Surgeon: [name], MD;  Location: [facility] ENDOSCOPY;  Service: Cardiovascular;  Laterality: N/A;  . Tee without cardioversion N/A 09/16/2012    Procedure: TRANSESOPHAGEAL ECHOCARDIOGRAM (TEE);  Surgeon: [name], MD;  Location: [facility] ENDOSCOPY;  Service: Cardiovascular;  Laterality: N/A;  . Cardioversion N/A 09/16/2012    Procedure: CARDIOVERSION;  Surgeon: [name], MD;  Location: [facility] ENDOSCOPY;  Service: Cardiovascular;  Laterality: N/A;  . Orif metacarpal fracture Right   . Colonoscopy with propofol N/A 01/31/2013    Procedure: COLONOSCOPY WITH PROPOFOL;  Surgeon: [name], MD;   Location: [facility] ENDOSCOPY;  Service: Endoscopy;  Laterality: N/A;  . Cardioversion N/A 03/14/2014    Procedure: CARDIOVERSION;  Surgeon: [name], MD;  Location: [place];  Service: Cardiovascular;  Laterality: N/A;  . Tee without cardioversion N/A 04/18/2014    Procedure: TRANSESOPHAGEAL ECHOCARDIOGRAM (TEE);  Surgeon: [name], MD;  Location: [facility] ENDOSCOPY;  Service: Cardiovascular;  Laterality: N/A;  . Atrial fibrillation ablation  04/19/2014  . Fracture surgery    . Atrial fibrillation ablation N/A 04/19/2014    Procedure: ATRIAL FIBRILLATION ABLATION;  Surgeon: [name], MD;  Location: [facility] CATH LAB;  Service: Cardiovascular;  Laterality: N/A;     Current Outpatient Prescriptions  Medication Sig Dispense Refill  . diltiazem (CARDIZEM CD) 120 MG 24 hr capsule Take 1 capsule (120 mg total) by mouth daily. 90 capsule 3  . ELIQUIS 5 MG TABS tablet TAKE 1 TABLET BY MOUTH TWICE DAILY 60 tablet 11  . Glucosamine-Chondroit-Vit C-Mn (GLUCOSAMINE 1500 COMPLEX) CAPS Take 1 capsule by mouth daily.    . Multiple Vitamin (MULTIVITAMIN WITH MINERALS) TABS tablet Take 1 tablet by mouth daily.    . valsartan-hydrochlorothiazide (DIOVAN-HCT) 320-25 MG per tablet Take 1 tablet by mouth every morning.     . vitamin C (ASCORBIC ACID) 500 MG tablet Take 500 mg by mouth daily.    . imiquimod (ALDARA) 5 % cream Apply 1 application topically daily.      No current facility-administered medications for this visit.    Allergies:   Lisinopril   Social History:  The patient  reports that he has never smoked. He has never used smokeless  tobacco. He reports that he drinks about 8.4 oz of alcohol per week. He reports that he does not use illicit drugs.   Family History:  The patient's  family history includes Hypertension in his mother.    ROS:  Please see the history of present illness.   All other systems are reviewed and negative.    PHYSICAL EXAM: VS:  BP 144/80 mmHg  Pulse 76  Ht 6' (1.829 m)   Wt 108.228 kg (238 lb 9.6 oz)  BMI 32.35 kg/m2 , BMI Body mass index is 32.35 kg/(m^2). GEN: Well nourished, well developed, in no acute distress HEENT: normal Neck: no JVD, carotid bruits, or masses Cardiac: RRR; no murmurs, rubs, or gallops,no edema  Respiratory:  clear to auscultation bilaterally, normal work of breathing, + adequate diaphragmatic excursion with percussion on exam GI: soft, nontender, nondistended, + BS MS: no deformity or atrophy Skin: warm and dry  Neuro:  Strength and sensation are intact Psych: euthymic mood, full affect  EKG:  EKG is ordered today. The ekg ordered today shows sinus rhythm   Recent Labs: 04/19/2014: Hemoglobin 15.5; Platelets 155 04/20/2014: BUN 9; Creatinine, Ser 0.80; Potassium 4.0; Sodium 135    Lipid Panel  No results found for: CHOL, TRIG, HDL, CHOLHDL, VLDL, LDLCALC, LDLDIRECT   Wt Readings from Last 3 Encounters:  11/05/14 108.228 kg (238 lb 9.6 oz)  07/26/14 112.038 kg (247 lb)  06/06/14 115.577 kg (254 lb 12.8 oz)      Other studies Reviewed: Additional studies/ records that were reviewed today include: recent holter reveals pacs, no afib   ASSESSMENT AND PLAN:  1.  afib Doing well s/p ablation chads2vasc score is 1.  He would like to stop eliquis as per guidelines.  2. R elevated hemidiaphragm Repeat CXR at this time  3. HTN Stable No change required today   Current medicines are reviewed at length with the patient today.   The patient does not have concerns regarding his medicines.  The following changes were made today:  none   Follow-up: return to see me in 6 months Follow-up with Dr Marlou Porch as scheduled   Signed, [name], MD  11/05/2014 10:39 AM     [place] [address] [phone] (office) [phone] (fax)

## 2014-12-04 ENCOUNTER — Ambulatory Visit (INDEPENDENT_AMBULATORY_CARE_PROVIDER_SITE_OTHER): Payer: 59 | Admitting: Cardiology

## 2014-12-04 ENCOUNTER — Encounter: Payer: Self-pay | Admitting: Cardiology

## 2014-12-04 VITALS — BP 138/76 | HR 71 | Wt 235.8 lb

## 2014-12-04 DIAGNOSIS — E669 Obesity, unspecified: Secondary | ICD-10-CM

## 2014-12-04 DIAGNOSIS — I10 Essential (primary) hypertension: Secondary | ICD-10-CM

## 2014-12-04 DIAGNOSIS — I480 Paroxysmal atrial fibrillation: Secondary | ICD-10-CM

## 2014-12-04 DIAGNOSIS — J986 Disorders of diaphragm: Secondary | ICD-10-CM

## 2014-12-04 NOTE — Progress Notes (Signed)
[place]. [address], [address], [place]  [address] Phone: [phone] Fax:  [phone]  Date:  12/04/2014   ID:  Reginald [name], DOB [birth year], MRN [id number]  PCP:  Henrine Screws, MD   History of Present Illness: Reginald [name] is a 64 y.o. male with history of persistent atrial fibrillation with ablation by Dr. [name] on 04/19/14.  In April 2015 underwent cardioversion after experiencing atrial fibrillation on of course. Atrial for ablation returned in June. Placed on Multaq at that time. Maintained rhythm until January 2016. Poor sleep may have been triggered.  Multaq 400 mg twice a day stopped. Main symptom is fatigue with atrial fibrillation. Eliquis, anticoagulation now stopped.  CXR - elevated hemidiaphragm.  06/06/14-After beach trip felt a few skipped beats.  This worried him. He may have felt some increased shortness of breath during that episode. Overall however, he is steadily improving with less and less shortness of breath. He still has some sensation of slightly worse breathing when laying on left versus right.  12/04/14-last office visit with Dr. [name] reviewed from 11/05/14-doing well. Score is 1. Stopped Eliquis per guidelines. Right elevated hemidiaphragm noted and although in his note it says to repeat chest x-ray at this time, I do not see the completion of this order. No atrial fibrillation detected on 24-hour Holter monitor in June 2016. Doing a good job with weight loss. Occasional PVCs, symptomatic.  His daughter got married at [place].  Wt Readings from Last 3 Encounters:  12/04/14 235 lb 12.8 oz (106.958 kg)  11/05/14 238 lb 9.6 oz (108.228 kg)  07/26/14 247 lb (112.038 kg)     Past Medical History  Diagnosis Date  . Hypertension   . Persistent atrial fibrillation 07/10/2012    cardioversion x 2  . Impaired fasting glucose   . Ureteral stone   . Pneumonia     8th grade, without recurrence    Past Surgical History  Procedure  Laterality Date  . Shoulder arthroscopy w/ rotator cuff repair Bilateral   . Tee without cardioversion N/A 07/14/2012    Procedure: TRANSESOPHAGEAL ECHOCARDIOGRAM (TEE);  Surgeon: [name], MD;  Location: [place];  Service: Cardiovascular;  Laterality: N/A;  . Cardioversion N/A 07/14/2012    Procedure: CARDIOVERSION;  Surgeon: [name], MD;  Location: [facility] ENDOSCOPY;  Service: Cardiovascular;  Laterality: N/A;  . Tee without cardioversion N/A 09/16/2012    Procedure: TRANSESOPHAGEAL ECHOCARDIOGRAM (TEE);  Surgeon: [name], MD;  Location: [facility] ENDOSCOPY;  Service: Cardiovascular;  Laterality: N/A;  . Cardioversion N/A 09/16/2012    Procedure: CARDIOVERSION;  Surgeon: [name], MD;  Location: [facility] ENDOSCOPY;  Service: Cardiovascular;  Laterality: N/A;  . Orif metacarpal fracture Right   . Colonoscopy with propofol N/A 01/31/2013    Procedure: COLONOSCOPY WITH PROPOFOL;  Surgeon: [name], MD;  Location: [facility] ENDOSCOPY;  Service: Endoscopy;  Laterality: N/A;  . Cardioversion N/A 03/14/2014    Procedure: CARDIOVERSION;  Surgeon: [name], MD;  Location: [place];  Service: Cardiovascular;  Laterality: N/A;  . Tee without cardioversion N/A 04/18/2014    Procedure: TRANSESOPHAGEAL ECHOCARDIOGRAM (TEE);  Surgeon: [name], MD;  Location: [facility] ENDOSCOPY;  Service: Cardiovascular;  Laterality: N/A;  . Atrial fibrillation ablation  04/19/2014  . Fracture surgery    . Atrial fibrillation ablation N/A 04/19/2014    Procedure: ATRIAL FIBRILLATION ABLATION;  Surgeon: [name], MD;  Location: [facility] CATH LAB;  Service: Cardiovascular;  Laterality: N/A;  Current Outpatient Prescriptions  Medication Sig Dispense Refill  . diltiazem (CARDIZEM CD) 120 MG 24 hr capsule Take 1 capsule (120 mg total) by mouth daily. 90 capsule 3  . Glucosamine-Chondroit-Vit C-Mn (GLUCOSAMINE 1500 COMPLEX) CAPS Take 1 capsule by mouth daily.    . Multiple Vitamin (MULTIVITAMIN WITH MINERALS) TABS tablet Take 1  tablet by mouth daily.    . valsartan-hydrochlorothiazide (DIOVAN-HCT) 320-25 MG per tablet Take 1 tablet by mouth every morning.     . vitamin C (ASCORBIC ACID) 500 MG tablet Take 500 mg by mouth daily.    . imiquimod (ALDARA) 5 % cream Apply 1 application topically daily.      No current facility-administered medications for this visit.    Allergies:    Allergies  Allergen Reactions  . Lisinopril Cough    Social History:  The patient  reports that he has never smoked. He has never used smokeless tobacco. He reports that he drinks about 8.4 oz of alcohol per week. He reports that he does not use illicit drugs.   Family History  Problem Relation Age of Onset  . Hypertension Mother     ROS:  Please see the history of present illness.   Denies any fevers, chills, orthopnea, PND, no bleeding   All other systems reviewed and negative.   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  Chest x-ray: Elevated right hemidiaphragm.  ASSESSMENT AND PLAN:  1. Paroxysmal atrial fibrillation-ablation by Dr. [name] 04/19/14. Doing well. Sinus rhythm. Eliquis stopped. CHADS-VASc 1.  No longer on Multaq. Rare palpitations. PVCs. They seem to accompany lack of sleep. On diltiazem. 2. Dyspnea - mild, subjective, CXR with elevated right hemidiaphragm. Actually today, he did not complain of any significant dyspnea. This  seems to be new when compared to scout film from abdominal CT scan in 2013. I will repeat chest x-ray PA and lateral.The symptoms seem to be improving.  3. He will be seeing Dr. [name] back in 6 months. I will see him back in 3 months at his request.  Signed, [name], MD [facility]  12/04/2014 9:44 AM

## 2014-12-04 NOTE — Patient Instructions (Signed)
Medication Instructions:  Your physician recommends that you continue on your current medications as directed. Please refer to the Current Medication list given to you today.  Testing/Procedures: A chest x-ray takes a picture of the organs and structures inside the chest, including the heart, lungs, and blood vessels. This test can show several things, including, whether the heart is enlarges; whether fluid is building up in the lungs; and whether pacemaker / defibrillator leads are still in place.  Follow-Up: Follow up in 3 months with Dr Marlou Porch.  Thank you for choosing [facility]!!

## 2015-03-12 ENCOUNTER — Encounter: Payer: Self-pay | Admitting: Cardiology

## 2015-03-12 ENCOUNTER — Ambulatory Visit (INDEPENDENT_AMBULATORY_CARE_PROVIDER_SITE_OTHER): Payer: 59 | Admitting: Cardiology

## 2015-03-12 VITALS — BP 108/72 | HR 72 | Ht 72.0 in | Wt 240.0 lb

## 2015-03-12 DIAGNOSIS — I10 Essential (primary) hypertension: Secondary | ICD-10-CM

## 2015-03-12 DIAGNOSIS — E669 Obesity, unspecified: Secondary | ICD-10-CM

## 2015-03-12 DIAGNOSIS — I480 Paroxysmal atrial fibrillation: Secondary | ICD-10-CM | POA: Diagnosis not present

## 2015-03-12 NOTE — Patient Instructions (Signed)
Your physician recommends that you continue on your current medications as directed. Please refer to the Current Medication list given to you today.  Your physician wants you to follow-up in: 6 months with Dr. Skains. You will receive a reminder letter in the mail two months in advance. If you don't receive a letter, please call our office to schedule the follow-up appointment.  

## 2015-03-12 NOTE — Progress Notes (Signed)
[place]. [address], [address], [place]  [address] Phone: [phone] Fax:  [phone]  Date:  03/12/2015   ID:  Reginald [name], DOB [birth year], MRN [id number]  PCP:  Henrine Screws, MD   History of Present Illness: Reginald [name] is a 64 y.o. male with history of persistent atrial fibrillation with ablation by Dr. [name] on 04/19/14.  In April 2015 underwent cardioversion after experiencing atrial fibrillation on of course. Atrial for ablation returned in June. Placed on Multaq at that time. Maintained rhythm until January 2016. Poor sleep may have been triggered.  Multaq 400 mg twice a day stopped. Main symptom is fatigue with atrial fibrillation. Eliquis, anticoagulation now stopped.  CXR - elevated hemidiaphragm.  06/06/14-After beach trip felt a few skipped beats.  This worried him. He may have felt some increased shortness of breath during that episode. Overall however, he is steadily improving with less and less shortness of breath. He still has some sensation of slightly worse breathing when laying on left versus right.  12/04/14-last office visit with Dr. [name] reviewed from 11/05/14-doing well. Score is 1. Stopped Eliquis per guidelines. Right elevated hemidiaphragm noted and although in his note it says to repeat chest x-ray at this time, I do not see the completion of this order. No atrial fibrillation detected on 24-hour Holter monitor in June 2016. Doing a good job with weight loss. Occasional PVCs, symptomatic.  His daughter got married at [place].  03/12/15 - overall feeling well. No significant evidence of atrial fibrillation. Breathing has improved. No bleeding. Traveling to [facility]. He has been to [place] as well.  No longer complaining of any symptoms of dyspnea. No bleeding. We discussed weight.  Wt Readings from Last 3 Encounters:  03/12/15 240 lb (108.863 kg)  12/04/14 235 lb 12.8 oz (106.958 kg)  11/05/14 238 lb 9.6 oz (108.228 kg)      Past Medical History  Diagnosis Date  . Hypertension   . Persistent atrial fibrillation ([place]) 07/10/2012    cardioversion x 2  . Impaired fasting glucose   . Ureteral stone   . Pneumonia     8th grade, without recurrence    Past Surgical History  Procedure Laterality Date  . Shoulder arthroscopy w/ rotator cuff repair Bilateral   . Tee without cardioversion N/A 07/14/2012    Procedure: TRANSESOPHAGEAL ECHOCARDIOGRAM (TEE);  Surgeon: [name], MD;  Location: [place];  Service: Cardiovascular;  Laterality: N/A;  . Cardioversion N/A 07/14/2012    Procedure: CARDIOVERSION;  Surgeon: [name], MD;  Location: [facility] ENDOSCOPY;  Service: Cardiovascular;  Laterality: N/A;  . Tee without cardioversion N/A 09/16/2012    Procedure: TRANSESOPHAGEAL ECHOCARDIOGRAM (TEE);  Surgeon: [name], MD;  Location: [facility] ENDOSCOPY;  Service: Cardiovascular;  Laterality: N/A;  . Cardioversion N/A 09/16/2012    Procedure: CARDIOVERSION;  Surgeon: [name], MD;  Location: [facility] ENDOSCOPY;  Service: Cardiovascular;  Laterality: N/A;  . Orif metacarpal fracture Right   . Colonoscopy with propofol N/A 01/31/2013    Procedure: COLONOSCOPY WITH PROPOFOL;  Surgeon: [name], MD;  Location: [facility] ENDOSCOPY;  Service: Endoscopy;  Laterality: N/A;  . Cardioversion N/A 03/14/2014    Procedure: CARDIOVERSION;  Surgeon: [name], MD;  Location: [place];  Service: Cardiovascular;  Laterality: N/A;  . Tee without cardioversion N/A 04/18/2014    Procedure: TRANSESOPHAGEAL ECHOCARDIOGRAM (TEE);  Surgeon: [name], MD;  Location: [facility] ENDOSCOPY;  Service: Cardiovascular;  Laterality: N/A;  . Atrial  fibrillation ablation  04/19/2014  . Fracture surgery    . Atrial fibrillation ablation N/A 04/19/2014    Procedure: ATRIAL FIBRILLATION ABLATION;  Surgeon: [name], MD;  Location: [facility] CATH LAB;  Service: Cardiovascular;  Laterality: N/A;    Current Outpatient Prescriptions  Medication Sig Dispense Refill  .  diltiazem (CARDIZEM CD) 120 MG 24 hr capsule Take 1 capsule (120 mg total) by mouth daily. 90 capsule 3  . Glucosamine-Chondroit-Vit C-Mn (GLUCOSAMINE 1500 COMPLEX) CAPS Take 1 capsule by mouth daily.    . imiquimod (ALDARA) 5 % cream Apply 1 application topically daily.     . Multiple Vitamin (MULTIVITAMIN WITH MINERALS) TABS tablet Take 1 tablet by mouth daily.    . valsartan-hydrochlorothiazide (DIOVAN-HCT) 320-25 MG per tablet Take 1 tablet by mouth every morning.     . vitamin C (ASCORBIC ACID) 500 MG tablet Take 500 mg by mouth daily.     No current facility-administered medications for this visit.    Allergies:    Allergies  Allergen Reactions  . Lisinopril Cough    Social History:  The patient  reports that he has never smoked. He has never used smokeless tobacco. He reports that he drinks about 8.4 oz of alcohol per week. He reports that he does not use illicit drugs.   Family History  Problem Relation Age of Onset  . Hypertension Mother     ROS:  Please see the history of present illness.   Denies any fevers, chills, orthopnea, PND, no bleeding   All other systems reviewed and negative.   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  Chest x-ray: 06/18/14: Elevated right hemidiaphragm.  ASSESSMENT AND PLAN:  1. Paroxysmal atrial fibrillation-ablation by Dr.  [name] 04/19/14. Doing well. Sinus rhythm. Eliquis stopped per guidelines. CHADS-VASc 1.  No longer on Multaq. Rare palpitations. PVCs. They seem to accompany lack of sleep. On diltiazem. Instructed him not to take aspirin on a daily basis. Try saline spray. Sometimes he wakes up at 4 AM with "clogged "nasal passages. 2. Dyspnea -  resolved, subjective, CXR with elevated right hemidiaphragm. Actually today, he did not complain of any significant dyspnea.  3. He will be seeing Dr. [name] back in 3 months. I will see him back in 6 months at his request.  Signed, [name], MD [facility]  03/12/2015 8:46 AM

## 2015-06-10 ENCOUNTER — Other Ambulatory Visit: Payer: Self-pay | Admitting: Cardiology

## 2015-06-10 ENCOUNTER — Other Ambulatory Visit: Payer: Self-pay | Admitting: *Deleted

## 2015-06-10 MED ORDER — VALSARTAN-HYDROCHLOROTHIAZIDE 320-25 MG PO TABS: 1.0000 | ORAL_TABLET | Freq: Every morning | ORAL | Status: DC

## 2015-07-09 ENCOUNTER — Other Ambulatory Visit: Payer: Self-pay | Admitting: Internal Medicine

## 2015-08-29 ENCOUNTER — Telehealth: Payer: Self-pay | Admitting: Cardiology

## 2015-08-29 DIAGNOSIS — I480 Paroxysmal atrial fibrillation: Secondary | ICD-10-CM

## 2015-08-29 NOTE — Telephone Encounter (Signed)
Pt states he needs labs, 24 hr holter monitor, list of meds and current EKG for FAA by end of June and will need letter form cardiologist. Pt has appt with Dr. Marlou Porch on 5/30 and was planning for EKG at that time. Pt would like to have holter and labs done prior to this appt though. Advised pt that I will route this message to Dr. Marlou Porch for approval of 24 holter and see which labs he would like to draw. Pt would need to have labs drawn at [facility] due to his insurance. Pt asked that once we know which labs Dr. Marlou Porch would like to place orders at front desk and he would come by and pick them up and go ahead and have them drawn. Will forward to Dr. Marlou Porch for review and advisement.

## 2015-08-29 NOTE — Telephone Encounter (Signed)
Order 24 holter monitor - afib Labs: BMET, CBC  [name], MD

## 2015-08-29 NOTE — Telephone Encounter (Signed)
Orders placed.  Spoke with pt and informed him of new orders.  Pt states he is actually close by our office and will swing by and pick up orders for labs.  Advised pt to stop by check out while he is here and schedule his holter monitor.  Pt verbalized understanding and was in agreement with this plan.

## 2015-08-29 NOTE — Telephone Encounter (Signed)
Pt has [place] physical and will need additional testing-pls call to discuss [phone]

## 2015-09-03 ENCOUNTER — Ambulatory Visit (INDEPENDENT_AMBULATORY_CARE_PROVIDER_SITE_OTHER): Payer: 59

## 2015-09-03 DIAGNOSIS — I480 Paroxysmal atrial fibrillation: Secondary | ICD-10-CM | POA: Diagnosis not present

## 2015-09-10 ENCOUNTER — Encounter: Payer: Self-pay | Admitting: Cardiology

## 2015-09-10 ENCOUNTER — Ambulatory Visit (INDEPENDENT_AMBULATORY_CARE_PROVIDER_SITE_OTHER): Payer: 59 | Admitting: Cardiology

## 2015-09-10 VITALS — BP 120/80 | HR 73 | Ht 72.0 in | Wt 244.4 lb

## 2015-09-10 DIAGNOSIS — I480 Paroxysmal atrial fibrillation: Secondary | ICD-10-CM | POA: Diagnosis not present

## 2015-09-10 NOTE — Patient Instructions (Signed)

## 2015-09-10 NOTE — Progress Notes (Signed)
[place]. [address], [address], [place]  [address] Phone: [phone] Fax:  [phone]  Date:  09/10/2015   ID:  Reginald [name], DOB Jun 29, 1950, MRN [id number]  PCP:  Henrine Screws, MD   History of Present Illness: Reginald [name] is a 65 y.o. male with history of persistent atrial fibrillation with ablation by Dr. [name] on 04/19/14.  In April 2015 underwent cardioversion after experiencing atrial fibrillation on of course. Atrial for ablation returned in June. Placed on Multaq at that time. Maintained rhythm until January 2016. Poor sleep may have been triggered.  Multaq 400 mg twice a day stopped. Main symptom is fatigue with atrial fibrillation. Eliquis, anticoagulation now stopped.  CXR - elevated hemidiaphragm.  06/06/14-After beach trip felt a few skipped beats.  This worried him. He may have felt some increased shortness of breath during that episode. Overall however, he is steadily improving with less and less shortness of breath. He still has some sensation of slightly worse breathing when laying on left versus right.  12/04/14-last office visit with Dr. [name] reviewed from 11/05/14-doing well. Score is 1. Stopped Eliquis per guidelines. Right elevated hemidiaphragm noted and although in his note it says to repeat chest x-ray at this time, I do not see the completion of this order. No atrial fibrillation detected on 24-hour Holter monitor in June 2016. Doing a good job with weight loss. Occasional PVCs, symptomatic.  His daughter got married at [place].  03/12/15 - overall feeling well. No significant evidence of atrial fibrillation. Breathing has improved. No bleeding. Traveling to [facility]. He has been to [place] as well.  No longer complaining of any symptoms of dyspnea. No bleeding. We discussed weight.  09/10/15-has gained approximate 10 pounds. Overall has been doing well. No syncope, no bleeding, no orthopnea, no PND. Rare palpitations felt. Fell once  on of course early in the morning.  Wt Readings from Last 3 Encounters:  09/10/15 244 lb 6.4 oz (110.859 kg)  03/12/15 240 lb (108.863 kg)  12/04/14 235 lb 12.8 oz (106.958 kg)     Past Medical History  Diagnosis Date  . Hypertension   . Persistent atrial fibrillation ([place]) 07/10/2012    cardioversion x 2  . Impaired fasting glucose   . Ureteral stone   . Pneumonia     8th grade, without recurrence    Past Surgical History  Procedure Laterality Date  . Shoulder arthroscopy w/ rotator cuff repair Bilateral   . Tee without cardioversion N/A 07/14/2012    Procedure: TRANSESOPHAGEAL ECHOCARDIOGRAM (TEE);  Surgeon: [name], MD;  Location: [place];  Service: Cardiovascular;  Laterality: N/A;  . Cardioversion N/A 07/14/2012    Procedure: CARDIOVERSION;  Surgeon: [name], MD;  Location: [facility] ENDOSCOPY;  Service: Cardiovascular;  Laterality: N/A;  . Tee without cardioversion N/A 09/16/2012    Procedure: TRANSESOPHAGEAL ECHOCARDIOGRAM (TEE);  Surgeon: [name], MD;  Location: [facility] ENDOSCOPY;  Service: Cardiovascular;  Laterality: N/A;  . Cardioversion N/A 09/16/2012    Procedure: CARDIOVERSION;  Surgeon: [name], MD;  Location: [facility] ENDOSCOPY;  Service: Cardiovascular;  Laterality: N/A;  . Orif metacarpal fracture Right   . Colonoscopy with propofol N/A 01/31/2013    Procedure: COLONOSCOPY WITH PROPOFOL;  Surgeon: [name], MD;  Location: [facility] ENDOSCOPY;  Service: Endoscopy;  Laterality: N/A;  . Cardioversion N/A 03/14/2014    Procedure: CARDIOVERSION;  Surgeon: [name], MD;  Location: [place];  Service: Cardiovascular;  Laterality: N/A;  .  Tee without cardioversion N/A 04/18/2014    Procedure: TRANSESOPHAGEAL ECHOCARDIOGRAM (TEE);  Surgeon: [name], MD;  Location: [facility] ENDOSCOPY;  Service: Cardiovascular;  Laterality: N/A;  . Atrial fibrillation ablation  04/19/2014  . Fracture surgery    . Atrial fibrillation ablation N/A 04/19/2014    Procedure: ATRIAL FIBRILLATION  ABLATION;  Surgeon: [name], MD;  Location: [facility] CATH LAB;  Service: Cardiovascular;  Laterality: N/A;    Current Outpatient Prescriptions  Medication Sig Dispense Refill  . CARTIA XT 120 MG 24 hr capsule TAKE ONE CAPSULE BY MOUTH DAILY 90 capsule 3  . Glucosamine-Chondroit-Vit C-Mn (GLUCOSAMINE 1500 COMPLEX) CAPS Take 1 capsule by mouth daily.    . imiquimod (ALDARA) 5 % cream Apply 1 application topically daily.     . Multiple Vitamin (MULTIVITAMIN WITH MINERALS) TABS tablet Take 1 tablet by mouth daily.    Marland Kitchen oxymetazoline (AFRIN) 0.05 % nasal spray Place 1 spray into both nostrils 2 (two) times daily as needed for congestion.    . valsartan-hydrochlorothiazide (DIOVAN-HCT) 320-25 MG tablet Take 1 tablet by mouth every morning. 30 tablet 11  . vitamin C (ASCORBIC ACID) 500 MG tablet Take 500 mg by mouth daily.     No current facility-administered medications for this visit.    Allergies:    Allergies  Allergen Reactions  . Lisinopril Cough    Social History:  The patient  reports that he has never smoked. He has never used smokeless tobacco. He reports that he drinks about 8.4 oz of alcohol per week. He reports that he does not use illicit drugs.   Family History  Problem Relation Age of Onset  . Hypertension Mother     ROS:  Please see the history of present illness.   Denies any fevers, chills, orthopnea, PND, no bleeding   All other systems reviewed and negative.   PHYSICAL EXAM: VS:  BP 120/80 mmHg  Pulse 73  Ht 6' (1.829 m)  Wt 244 lb 6.4 oz (110.859 kg)  BMI 33.14 kg/m2  SpO2 94% Well nourished, well developed, in no acute distress HEENT: normal, [place]/AT, EOMI Neck: no JVD, normal carotid upstroke, no bruit Cardiac:  normal S1, S2; RRR; no murmur Lungs:  clear to auscultation bilaterally, no wheezing, rhonchi or rales Abd: soft, nontender, no hepatomegaly, no bruits Ext: no edema, 2+ distal pulses Skin: warm and dry GU: deferred Neuro: no focal abnormalities  noted, AAO x 3  EKG:   04/20/14-sinus rhythm, 68, no other abnormalities    Echocardiogram, transesophageal-04/18/14-normal ejection fraction, septal aneurysm.  Chest x-ray: 06/18/14: Elevated right hemidiaphragm.  ASSESSMENT AND PLAN:  1. Paroxysmal atrial fibrillation-ablation by Dr. [name] 04/19/14. Doing well. Sinus rhythm. Eliquis stopped per guidelines. CHADS-VASc 1.  No longer on Multaq. Stopped Eliquis as well. Per guidelines. Rare palpitations. PVCs. They seem to accompany lack of sleep. On diltiazem.  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 2.   3. 6 month follow up.  Signed, [name], MD [facility]  09/10/2015 9:00 AM

## 2015-09-11 ENCOUNTER — Ambulatory Visit: Payer: 59 | Admitting: Cardiology

## 2015-09-12 ENCOUNTER — Other Ambulatory Visit: Payer: Self-pay | Admitting: Cardiology

## 2015-09-12 ENCOUNTER — Encounter: Payer: Self-pay | Admitting: *Deleted

## 2015-09-12 LAB — CBC WITH DIFFERENTIAL/PLATELET
BASOS ABS: 0 10*3/uL (ref 0.0–0.2)
Basos: 1 %
EOS (ABSOLUTE): 0.3 10*3/uL (ref 0.0–0.4)
EOS: 4 %
HEMOGLOBIN: 16.5 g/dL (ref 12.6–17.7)
Hematocrit: 47.3 % (ref 37.5–51.0)
Lymphocytes Absolute: 1.3 10*3/uL (ref 0.7–3.1)
Lymphs: 19 %
MCH: 30.3 pg (ref 26.6–33.0)
MCHC: 34.9 g/dL (ref 31.5–35.7)
MCV: 87 fL (ref 79–97)
MONOS ABS: 0.6 10*3/uL (ref 0.1–0.9)
Monocytes: 8 %
NEUTROS PCT: 68 %
Neutrophils Absolute: 4.9 10*3/uL (ref 1.4–7.0)
PLATELETS: 163 10*3/uL (ref 150–379)
RBC: 5.44 x10E6/uL (ref 4.14–5.80)
RDW: 13.8 % (ref 12.3–15.4)
WBC: 7.1 10*3/uL (ref 3.4–10.8)

## 2015-09-12 LAB — BASIC METABOLIC PANEL
BUN / CREAT RATIO: 19 (ref 10–24)
BUN: 13 mg/dL (ref 8–27)
CHLORIDE: 101 mmol/L (ref 96–106)
CO2: 28 mmol/L (ref 18–29)
CREATININE: 0.69 mg/dL — AB (ref 0.76–1.27)
Calcium: 9.7 mg/dL (ref 8.6–10.2)
GFR calc Af Amer: 116 mL/min/{1.73_m2} (ref >59)
GFR calc non Af Amer: 100 mL/min/{1.73_m2} (ref >59)
GLUCOSE: 105 mg/dL — AB (ref 65–99)
POTASSIUM: 4.9 mmol/L (ref 3.5–5.2)
SODIUM: 137 mmol/L (ref 134–144)

## 2015-09-23 ENCOUNTER — Telehealth: Payer: Self-pay | Admitting: Cardiology

## 2015-09-23 MED ORDER — VALSARTAN-HYDROCHLOROTHIAZIDE 320-25 MG PO TABS: 1.0000 | ORAL_TABLET | Freq: Every morning | ORAL | Status: DC

## 2015-09-23 MED ORDER — DILTIAZEM HCL ER COATED BEADS 120 MG PO CP24: 120.0000 mg | ORAL_CAPSULE | Freq: Every day | ORAL | Status: DC

## 2015-09-23 NOTE — Telephone Encounter (Signed)
Pt calling because he is concerned that he may need an EKG.  One was not repeated at last office visit because he had just completed a 24 hr monitor that demonstrated no At Fib, average HR 65, no pauses and a rare PVC.  That documentation can be supplied if needed.  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

## 2015-09-23 NOTE — Telephone Encounter (Signed)
New message:  Pt is calling in to speak with Pam about a piece of information that is missing from his [facility] physical. Please f/u with him.

## 2015-09-30 ENCOUNTER — Telehealth: Payer: Self-pay | Admitting: Cardiology

## 2015-09-30 NOTE — Telephone Encounter (Signed)
New mesasge  Pt request a call back ASAP. Nothing urgent. Has a slight physical at lunch on 10/01/2015 and request a little more detail about the 24 hour monitor results that he has recently worn. The last time was a 8 page results. This time he only received a letter. Pt request details. please call

## 2015-09-30 NOTE — Telephone Encounter (Signed)
Spoke with pt.  He is requesting a copy of his monitor report. Aware he will need to sign a ROI here at the office and MR can obtain a copy for him.  He states understanding and will come to the office to sign.

## 2015-12-03 ENCOUNTER — Telehealth: Payer: Self-pay | Admitting: Cardiology

## 2015-12-03 MED ORDER — DILTIAZEM HCL ER COATED BEADS 120 MG PO CP24: 120.0000 mg | ORAL_CAPSULE | Freq: Every day | ORAL | 3 refills | Status: DC | PRN

## 2015-12-03 NOTE — Telephone Encounter (Signed)
New message      Pt called concerning his prescription for 90 day diltiazem and states that insurance won't pay for it b/c the prescription states that it says take one a day but pt takes may take 2 a day sometimes. He needs a new prescription with new wording to allow for him to be able to take 2 when needed. Please call.       *STAT* If patient is at the pharmacy, call can be transferred to refill team.   1. Which medications need to be refilled? (please list name of each medication and dose if known)Diltiazem   2. Which pharmacy/location (including street and city if local pharmacy) is [facility] [address]  3. Do they need a 30 day or 90 day supply? [place]

## 2015-12-03 NOTE — Telephone Encounter (Signed)
Pt is requesting a new RX stating he can take prn Diltiazem 120 mg as needed.  Will review with MD.

## 2015-12-03 NOTE — Telephone Encounter (Signed)
Spoke with pt who is reporting he is only taking an extra Diltiazem 120 mg about 2 to 3 times a week.  He would like a separate RX for #45 to take as directed.  This has been sent into pharmacy as requested.

## 2015-12-03 NOTE — Telephone Encounter (Signed)
He may prescribe diltiazem CD 120 mg twice a day Reginald Furbish, MD

## 2016-08-24 ENCOUNTER — Telehealth: Payer: Self-pay | Admitting: Cardiology

## 2016-08-24 DIAGNOSIS — Z79899 Other long term (current) drug therapy: Secondary | ICD-10-CM

## 2016-08-24 DIAGNOSIS — I480 Paroxysmal atrial fibrillation: Secondary | ICD-10-CM

## 2016-08-24 DIAGNOSIS — I10 Essential (primary) hypertension: Secondary | ICD-10-CM

## 2016-08-24 NOTE — Telephone Encounter (Signed)
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

## 2016-08-24 NOTE — Telephone Encounter (Signed)
Orders placed for lab work and 24 hour holter.  Pt aware he will receive a call to schedule.

## 2016-08-24 NOTE — Telephone Encounter (Signed)
Okay to order 24-hour Holter monitor-paroxysmal atrial fibrillation Also check complete metabolic profile, CBC He should have a follow-up will point with me soon, yearly checkup that is. [name], MD

## 2016-08-24 NOTE — Telephone Encounter (Signed)
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  Advised I will forward this to Dr Marlou Porch for review and orders.

## 2016-08-27 NOTE — Telephone Encounter (Signed)
Follow up   Pt is calling to follow up with RN. He is asking for phone call.

## 2016-08-27 NOTE — Telephone Encounter (Signed)
Spoke with patient who states he is returning a call. I spoke with Reginald [name] from [facility] and she advised that a message was left for patient yesterday. She agreed to take the call and schedule the patient. Patient aware that I am transferring his call and he thanked me for my help.

## 2016-09-03 ENCOUNTER — Other Ambulatory Visit: Payer: Self-pay | Admitting: Cardiology

## 2016-09-03 ENCOUNTER — Other Ambulatory Visit: Payer: 59 | Admitting: *Deleted

## 2016-09-03 ENCOUNTER — Ambulatory Visit (INDEPENDENT_AMBULATORY_CARE_PROVIDER_SITE_OTHER): Payer: 59

## 2016-09-03 DIAGNOSIS — I480 Paroxysmal atrial fibrillation: Secondary | ICD-10-CM

## 2016-09-03 DIAGNOSIS — I10 Essential (primary) hypertension: Secondary | ICD-10-CM

## 2016-09-03 DIAGNOSIS — Z79899 Other long term (current) drug therapy: Secondary | ICD-10-CM

## 2016-09-04 LAB — CBC
HEMATOCRIT: 43.3 % (ref 37.5–51.0)
Hemoglobin: 14.8 g/dL (ref 13.0–17.7)
MCH: 29.5 pg (ref 26.6–33.0)
MCHC: 34.2 g/dL (ref 31.5–35.7)
MCV: 86 fL (ref 79–97)
Platelets: 183 10*3/uL (ref 150–379)
RBC: 5.01 x10E6/uL (ref 4.14–5.80)
RDW: 13.6 % (ref 12.3–15.4)
WBC: 5 10*3/uL (ref 3.4–10.8)

## 2016-09-04 LAB — COMPREHENSIVE METABOLIC PANEL
ALK PHOS: 26 IU/L — AB (ref 39–117)
ALT: 29 IU/L (ref 0–44)
AST: 18 IU/L (ref 0–40)
Albumin/Globulin Ratio: 2.1 (ref 1.2–2.2)
Albumin: 4 g/dL (ref 3.6–4.8)
BUN/Creatinine Ratio: 17 (ref 10–24)
BUN: 11 mg/dL (ref 8–27)
Bilirubin Total: 0.5 mg/dL (ref 0.0–1.2)
CALCIUM: 9 mg/dL (ref 8.6–10.2)
CO2: 24 mmol/L (ref 18–29)
CREATININE: 0.63 mg/dL — AB (ref 0.76–1.27)
Chloride: 100 mmol/L (ref 96–106)
GFR calc Af Amer: 120 mL/min/{1.73_m2} (ref >59)
GFR, EST NON AFRICAN AMERICAN: 103 mL/min/{1.73_m2} (ref >59)
GLOBULIN, TOTAL: 1.9 g/dL (ref 1.5–4.5)
GLUCOSE: 104 mg/dL — AB (ref 65–99)
Potassium: 4.3 mmol/L (ref 3.5–5.2)
SODIUM: 137 mmol/L (ref 134–144)
Total Protein: 5.9 g/dL — ABNORMAL LOW (ref 6.0–8.5)

## 2016-09-16 ENCOUNTER — Encounter: Payer: Self-pay | Admitting: *Deleted

## 2016-09-16 ENCOUNTER — Telehealth: Payer: Self-pay | Admitting: Cardiology

## 2016-09-16 NOTE — Telephone Encounter (Signed)
Spoke with pt who is aware of his monitor results and is requesting his [place] letter to be completed.  Advised I will type the letter, have Dr Marlou Porch sign it and leave it at the front desk when it is ready.  He states understanding.

## 2016-09-16 NOTE — Telephone Encounter (Signed)
Letter placed at front desk for pt to pick up.  He is aware.

## 2016-09-16 NOTE — Telephone Encounter (Signed)
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 He states that Dr. Marlou Porch writes him one every 2 years and will know what he is talking about. Also requests a phone call back.

## 2016-09-18 ENCOUNTER — Telehealth: Payer: Self-pay | Admitting: Cardiology

## 2016-09-18 NOTE — Telephone Encounter (Signed)
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  Advised will send over to med rec to assist in getting this report.   May need to come in to sign release.  Patient aware and will if needed.    Routed to Medical Records.

## 2016-09-18 NOTE — Telephone Encounter (Signed)
Reginald [name] is calling because he is needing some additional information to the monitor he had . Please call .Marland Kitchen Thanks

## 2016-09-19 ENCOUNTER — Other Ambulatory Visit: Payer: Self-pay | Admitting: Cardiology

## 2016-09-21 NOTE — Telephone Encounter (Signed)
Follow Up Call  Reginald [name] is calling again concerning his FFA . He has a couple of questions . Please call

## 2016-09-21 NOTE — Telephone Encounter (Signed)
LVM For patient to call me back. I have records ready for pick up.

## 2016-09-21 NOTE — Telephone Encounter (Signed)
Returned call to patient- patient states he was reviewing the medical letter from FFA and his heartrate cannot exceed 120 bpm on the monitor.  Patient states he may have worked out while wearing the monitor and concerned his HR may have went over 120 bpm.   Per results-max HR 109.   Patient aware.   Advised I would follow up with medical records and we will contact him back.

## 2016-10-18 ENCOUNTER — Other Ambulatory Visit: Payer: Self-pay | Admitting: Cardiology

## 2016-10-21 NOTE — Progress Notes (Signed)
Cardiology Office Note    Date:  10/22/2016   ID:  Reginald [name], DOB Jul 01, 1950, MRN [id number]  PCP:  [name], MD  Cardiologist:  Dr. Marlou Porch / Dr. [name]  CC: yearly follow up   History of Present Illness:  Reginald [name] is a 66 y.o. male with a history of HTN and PAF s/p RFA 04/2015 who presents to clinic for follow up.   He has been followed by Dr. Marlou Porch for PAF. He failed Mutaq and ultimately underwent successful RFA by Dr. [name] in 04/2015. He was taken off Eliquis given his CHADSVASC of 1 (HTN). He is a Insurance underwriter and requires routine holters. This was last completed in 09/2016 and showed no atrial fibrillation.   Today he presents to clinic for follow up. No CP or SOB. No LE edema, orthopnea or PND. No dizziness or syncope. No blood in stool or urine. No palpitations. Exercising regularly on the eliptical with no issues. Going [place] a long [place] with 18 people in the next month.     Past Medical History:  Diagnosis Date  . Hypertension   . Impaired fasting glucose   . Persistent atrial fibrillation ([place]) 07/10/2012   cardioversion x 2  . Pneumonia    8th grade, without recurrence  . Ureteral stone     Past Surgical History:  Procedure Laterality Date  . ATRIAL FIBRILLATION ABLATION  04/19/2014  . ATRIAL FIBRILLATION ABLATION N/A 04/19/2014   Procedure: ATRIAL FIBRILLATION ABLATION;  Surgeon: Lesbia Ottaway Grayer, MD;  Location[facility] C[facility] LAB;  Service: Cardiovascular;  Laterality: N/A;  . CARDIOVERSION N/A 07/14/2012   Procedure: CARDIOVERSION;  Surgeon: [name], MD;  Location[facility] ENDOSCOPY;  Service: Cardiovascular;  Laterality: N/A;  . CARDIOVERSION N/A 09/16/2012   Procedure: CARDIOVERSION;  Surgeon: [name], MD;  Location: [facility] ENDOSCOPY;  Service: Cardiovascular;  Laterality: N/A;  . CARDIOVERSION N/A 03/14/2014   Procedure: CARDIOVERSION;  Surgeon: [name], MD;  Location: [place];  Service: Cardiovascular;  Laterality: N/A;  . COLONOSCOPY WITH  PROPOFOL N/A 01/31/2013   Procedure: COLONOSCOPY WITH PROPOFOL;  Surgeon: [name], MD;  Location: [facility] ENDOSCOPY;  Service: Endoscopy;  Laterality: N/A;  . FRACTURE SURGERY    . ORIF METACARPAL FRACTURE Right   . SHOULDER ARTHROSCOPY W/ ROTATOR CUFF REPAIR Bilateral   . TEE WITHOUT CARDIOVERSION N/A 07/14/2012   Procedure: TRANSESOPHAGEAL ECHOCARDIOGRAM (TEE);  Surgeon: [name], MD;  Location: [facility] ENDOSCOPY;  Service: Cardiovascular;  Laterality: N/A;  . TEE WITHOUT CARDIOVERSION N/A 09/16/2012   Procedure: TRANSESOPHAGEAL ECHOCARDIOGRAM (TEE);  Surgeon: [name], MD;  Location: [facility] ENDOSCOPY;  Service: Cardiovascular;  Laterality: N/A;  . TEE WITHOUT CARDIOVERSION N/A 04/18/2014   Procedure: TRANSESOPHAGEAL ECHOCARDIOGRAM (TEE);  Surgeon: [name], MD;  Location: [facility] ENDOSCOPY;  Service: Cardiovascular;  Laterality: N/A;    Current Medications: Outpatient Medications Prior to Visit  Medication Sig Dispense Refill  . Glucosamine-Chondroit-Vit C-Mn (GLUCOSAMINE 1500 COMPLEX) CAPS Take 1 capsule by mouth daily.    . imiquimod (ALDARA) 5 % cream Apply 1 application topically daily.     . Multiple Vitamin (MULTIVITAMIN WITH MINERALS) TABS tablet Take 1 tablet by mouth daily.    Marland Kitchen oxymetazoline (AFRIN) 0.05 % nasal spray Place 1 spray into both nostrils 2 (two) times daily as needed for congestion.    . valsartan-hydrochlorothiazide (DIOVAN-HCT) 320-25 MG tablet TAKE 1 TABLET BY MOUTH EVERY DAY IN THE MORNING 30 tablet 0  . vitamin C (ASCORBIC ACID) 500 MG tablet Take  500 mg by mouth daily.    Marland Kitchen diltiazem (CARDIZEM CD) 120 MG 24 hr capsule TAKE 1 CAPSULE (120 MG TOTAL) BY MOUTH DAILY. 30 capsule 0  . diltiazem (CARDIZEM CD) 120 MG 24 hr capsule Take 1 capsule (120 mg total) by mouth daily as needed. (Patient not taking: Reported on 10/22/2016) 45 capsule 3   No facility-administered medications prior to visit.      Allergies:   Lisinopril   Social History   Social History  .  Marital status: Married    Spouse name: N/A  . Number of children: N/A  . Years of education: N/A   Social History Main Topics  . Smoking status: Never Smoker  . Smokeless tobacco: Never Used  . Alcohol use 8.4 oz/week    7 Glasses of wine, 7 Shots of liquor per week     Comment: 04/19/2014 "2-3 glasses of wine or liquor per day"  . Drug use: No  . Sexual activity: Not Currently   Other Topics Concern  . None   Social History Narrative   Lives in [place] with spouse.  2 children (ages 12 and 69).   Owns [employer]     Family History:  The patient's family history includes Hypertension in his mother.      ROS:   Please see the history of present illness.    ROS All other systems reviewed and are negative.   PHYSICAL EXAM:   VS:  BP 98/60   Pulse (!) 56   Ht 6' (1.829 m)   Wt 248 lb 1.9 oz (112.5 kg)   BMI 33.65 kg/m    GEN: Well nourished, well developed, in no acute distress  HEENT: normal  Neck: no JVD, carotid bruits, or masses Cardiac:RRR; no murmurs, rubs, or gallops,no edema  Respiratory:  clear to auscultation bilaterally, normal work of breathing GI: soft, nontender, nondistended, + BS MS: no deformity or atrophy  Skin: warm and dry, no rash Neuro:  Alert and Oriented x 3, Strength and sensation are intact Psych: euthymic mood, full affect   Wt Readings from Last 3 Encounters:  10/22/16 248 lb 1.9 oz (112.5 kg)  09/10/15 244 lb 6.4 oz (110.9 kg)  03/12/15 240 lb (108.9 kg)      Studies/Labs Reviewed:   EKG:  EKG is ordered today.  The ekg ordered today demonstrates sinus brady HR 56.   Recent Labs: 09/03/2016: ALT 29; BUN 11; Creatinine, Ser 0.63; Hemoglobin 14.8; Platelets 183; Potassium 4.3; Sodium 137   Lipid Panel No results found for: CHOL, TRIG, HDL, CHOLHDL, VLDL, LDLCALC, LDLDIRECT  Additional studies/ records that were reviewed today include:  2D ECHO: 09/18/2013 LV EF: 60% -  65% Study Conclusions - Left ventricle:  The cavity size was normal. There was moderate concentric hypertrophy. Systolic function was normal. The estimated ejection fraction was in the range of 60% to 65%. Wall motion was normal; there were no regional wall motion abnormalities. Doppler parameters are consistent with abnormal left ventricular relaxation (grade 1 diastolic dysfunction). There was no evidence of elevated ventricular filling pressure by Doppler parameters. - Aortic valve: Trileaflet; normal thickness leaflets. - Aortic root: The aortic root was normal in size. - Mitral valve: Structurally normal valve. There was no regurgitation. - Left atrium: The atrium was mildly dilated. - Right ventricle: Systolic function was normal. - Tricuspid valve: There was no regurgitation. - Pulmonic valve: Structurally normal valve. - Pulmonary arteries: Systolic pressure was within the normal range. - Pericardium,  extracardiac: There was no pericardial effusion. Impressions - Normal biventricular size and function. Mildly dilated left atrium. Abnormal relaxation. No significant valvular abnormality.  2D ECHO: 04/18/2014 LV EF: 55% -  65% Study Conclusions - Left ventricle: Systolic function was normal. The estimated ejection fraction was in the range of 55% to 65%. Wall motion was normal; there were no regional wall motion abnormalities. - Left atrium: The atrium was mildly dilated. No evidence of thrombus in the atrial cavity or appendage. No evidence of thrombus in the atrial cavity or appendage. - Right atrium: No evidence of thrombus in the atrial cavity or appendage. 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  Rare PVCs (32), rare PACs (14)  No atrial  fibrillation  Brief 5-6 beat run of paroxysmal atrial tachycardia, benign  Reassuring Holter monitor.        ASSESSMENT & PLAN:   PAF s/p RFA: with no recurrence since ablation in 04/2015. 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 Continue now Cardizem 120mg  daily. He has previously not been on [place] given CHADSVASC score of 1. Now CHADSVASC 2 since he turned 60.    This patients CHA2DS2-VASc Score and unadjusted Ischemic Stroke Rate (% per year) is equal to 2.2 % stroke rate/year from a score of 2  Above score calculated as 1 point each if present [CHF, HTN, DM, Vascular=MI/PAD/Aortic Plaque, Age if 65-74, or Male] Above score calculated as 2 points each if present [Age > 75, or Stroke/TIA/TE]  We discussed the risks and benefits of [place]. He decided he did not want to start a DOAC, but is willing to start ASA 81mg  daily.   HTN: BP 122/78 on my personal recheck. Continue current medications   Medication Adjustments/Labs and Tests Ordered: Current medicines are reviewed at length with the patient today.  Concerns regarding medicines are outlined above.  Medication changes, Labs and Tests ordered today are listed in the Patient Instructions below. Patient Instructions  Medication Instructions:  Your physician has recommended you make the following change in your medication:  1.  START Aspirin 81 mg daily  Labwork: None ordered  Testing/Procedures: None ordered  Follow-Up: Your physician wants you to follow-up in: [place] will receive a reminder letter in the mail two months in advance. If you don't receive a letter, please call our office to schedule the follow-up appointment.   Any Other Special Instructions Will Be Listed Below (If Applicable).     If you need a refill on your cardiac medications before your next appointment, please call your pharmacy.      Mable Fill, PA-C  10/22/2016 8:43 AM    [facility] [place], [place], [place]  [address] Phone: [phone]; Fax: [phone]

## 2016-10-22 ENCOUNTER — Encounter: Payer: Self-pay | Admitting: Physician Assistant

## 2016-10-22 ENCOUNTER — Ambulatory Visit (INDEPENDENT_AMBULATORY_CARE_PROVIDER_SITE_OTHER): Payer: 59 | Admitting: Physician Assistant

## 2016-10-22 VITALS — BP 98/60 | HR 56 | Ht 72.0 in | Wt 248.1 lb

## 2016-10-22 DIAGNOSIS — I10 Essential (primary) hypertension: Secondary | ICD-10-CM

## 2016-10-22 DIAGNOSIS — I480 Paroxysmal atrial fibrillation: Secondary | ICD-10-CM

## 2016-10-22 MED ORDER — DILTIAZEM HCL ER COATED BEADS 120 MG PO CP24: ORAL_CAPSULE | ORAL | 3 refills | Status: DC

## 2016-10-22 MED ORDER — ASPIRIN EC 81 MG PO TBEC: 81.0000 mg | DELAYED_RELEASE_TABLET | Freq: Every day | ORAL | 3 refills | Status: DC

## 2016-10-22 NOTE — Patient Instructions (Signed)
Medication Instructions:  Your physician has recommended you make the following change in your medication:  1.  START Aspirin 81 mg daily  Labwork: None ordered  Testing/Procedures: None ordered  Follow-Up: Your physician wants you to follow-up in: [place] will receive a reminder letter in the mail two months in advance. If you don't receive a letter, please call our office to schedule the follow-up appointment.   Any Other Special Instructions Will Be Listed Below (If Applicable).     If you need a refill on your cardiac medications before your next appointment, please call your pharmacy.

## 2016-10-26 ENCOUNTER — Telehealth: Payer: Self-pay | Admitting: Physician Assistant

## 2016-10-26 NOTE — Telephone Encounter (Signed)
New Message    Prescription was called into wrong pharmacy needs fixed asap he is leaving country in the morning   *STAT* If patient is at the pharmacy, call can be transferred to refill team.   1. Which medications need to be refilled? (please list name of each medication and dose if known)  diltiazem (CARDIZEM CD) 120 MG 24 hr capsule Take 1 tablet by mouth daily, take 1 tablet as needed for palpitations     2. Which pharmacy/location (including street and city if local pharmacy) [facility] [address]   3. Do they need a 30 day or 90 day supply?  Reginald [name]

## 2016-11-02 ENCOUNTER — Other Ambulatory Visit: Payer: Self-pay | Admitting: Cardiology

## 2016-11-15 ENCOUNTER — Other Ambulatory Visit: Payer: Self-pay | Admitting: Cardiology

## 2016-11-16 ENCOUNTER — Telehealth: Payer: Self-pay | Admitting: Cardiology

## 2016-11-16 MED ORDER — OLMESARTAN MEDOXOMIL-HCTZ 40-25 MG PO TABS: 1.0000 | ORAL_TABLET | Freq: Every day | ORAL | 6 refills | Status: DC

## 2016-11-16 MED ORDER — DILTIAZEM HCL ER COATED BEADS 120 MG PO CP24: ORAL_CAPSULE | ORAL | 3 refills | Status: DC

## 2016-11-16 NOTE — Telephone Encounter (Signed)
New Message    Lost his luggage at airport , needs new presciptions, he is going out of the country, he would like a 7 day supply of eliquis to take with him.   *STAT* If patient is at the pharmacy, call can be transferred to refill team.   1. Which medications need to be refilled? (please list name of each medication and dose if known)  diltiazem (CARDIZEM CD) 120 MG 24 hr capsule Take 1 tablet by mouth daily, take 1 tablet as needed for palpitations   Valsartan  eliquis   2. Which pharmacy/location (including street and city if local pharmacy) [facility] [address]  3. Do they need a 30 day or 90 day supply?[place]

## 2016-11-16 NOTE — Telephone Encounter (Signed)
Spoke with pt who had been traveling and his luggage with his medications have been lost.  He is [facility] [address]  He is aware I will sent them in however his insurance will need to give him an override since it will early to refill.  Of note-he is not presently on Eliquis.  He had been on Eliquis prior to his ablation and has not had any At Fib since.  He is requesting samples of it just incase he goes into At Fib while overseas so he will not have to go to a hospital for treatment.  Advised this is not typically a medication we would Rx like this and if he were to go into At Fib or notice an abnormal heart rhythm he will need to go to ED for eval.  He would like Dr Marlou Porch' recommendation.  Aware I will forward this to Dr Marlou Porch.

## 2016-11-22 ENCOUNTER — Emergency Department (HOSPITAL_COMMUNITY)
Admission: EM | Admit: 2016-11-22 | Discharge: 2016-11-22 | Disposition: A | Discharge status: Home / Self Care | Payer: 59 | Attending: Emergency Medicine | Admitting: Emergency Medicine

## 2016-11-22 ENCOUNTER — Encounter (HOSPITAL_COMMUNITY): Payer: Self-pay | Admitting: Emergency Medicine

## 2016-11-22 DIAGNOSIS — T783XXA Angioneurotic edema, initial encounter: Secondary | ICD-10-CM | POA: Insufficient documentation

## 2016-11-22 DIAGNOSIS — I10 Essential (primary) hypertension: Secondary | ICD-10-CM | POA: Insufficient documentation

## 2016-11-22 DIAGNOSIS — R220 Localized swelling, mass and lump, head: Secondary | ICD-10-CM | POA: Diagnosis present

## 2016-11-22 DIAGNOSIS — Z7982 Long term (current) use of aspirin: Secondary | ICD-10-CM | POA: Diagnosis not present

## 2016-11-22 MED ORDER — SODIUM CHLORIDE 0.9 % IV SOLN
INTRAVENOUS | Status: DC
  Administered 2016-11-22: 04:00:00 via INTRAVENOUS

## 2016-11-22 MED ORDER — PREDNISONE 50 MG PO TABS: ORAL_TABLET | ORAL | 0 refills | Status: DC

## 2016-11-22 MED ORDER — FAMOTIDINE IN NACL 20-0.9 MG/50ML-% IV SOLN
20.0000 mg | Freq: Once | INTRAVENOUS | Status: AC
  Administered 2016-11-22: 20 mg via INTRAVENOUS
  Filled 2016-11-22: qty 50

## 2016-11-22 MED ORDER — METHYLPREDNISOLONE SODIUM SUCC 125 MG IJ SOLR
125.0000 mg | Freq: Once | INTRAMUSCULAR | Status: AC
  Administered 2016-11-22: 125 mg via INTRAVENOUS
  Filled 2016-11-22: qty 2

## 2016-11-22 MED ORDER — SODIUM CHLORIDE 0.9 % IV SOLN: Freq: Once | INTRAVENOUS | Status: DC

## 2016-11-22 MED ORDER — EPINEPHRINE 0.3 MG/0.3ML IJ SOAJ: 0.3000 mg | Freq: Once | INTRAMUSCULAR | 0 refills | Status: AC

## 2016-11-22 MED ORDER — DIPHENHYDRAMINE HCL 50 MG/ML IJ SOLN
25.0000 mg | Freq: Once | INTRAMUSCULAR | Status: AC
Start: 2016-11-22 — End: 2016-11-22
  Administered 2016-11-22: 25 mg via INTRAVENOUS
  Filled 2016-11-22: qty 1

## 2016-11-22 NOTE — Discharge Instructions (Signed)
Stop taking olmesartan.  Do not take any ACE inhibitors or ARBs. Let your doctor know about this reaction.  Take the steroids as prescribed. Return to the ED if you develop difficulty breathing, difficulty swallowing, chest pain or any other concerns.

## 2016-11-22 NOTE — ED Triage Notes (Signed)
Reports waking up with tongue swollen.  Had similar episode a week ago.  Used epi pen pta.  Took benadryl 50mg  pta.

## 2016-11-22 NOTE — ED Provider Notes (Signed)
[facility] DEPT Provider Note   CSN: [id number] Arrival date & time: 11/22/16  9326     History   Chief Complaint Chief Complaint  Patient presents with  . Allergic Reaction    HPI Reginald [name] is a 66 y.o. male.  Patient presents with tongue swelling that woke him from sleep about 3:15. States she first had itching in his tongue then progressed to diffuse swelling of his tongue. He took 2 Benadryl about 3:30 AM as well as gave himself epinephrine injection. He states he had a similar reaction in [place] 3 weeks ago when he was traveling. This resolved after several hours and patient reports he didn't know what was causing it. Denies any new medications however his valsartan was recently switched to olmesartan. Complains of difficulty swallowing. Denies any difficulty breathing or chest pain. No dizziness or lightheadedness. No fever or vomiting.   The history is provided by the patient.  Allergic Reaction    Past Medical History:  Diagnosis Date  . Hypertension   . Impaired fasting glucose   . Persistent atrial fibrillation ([place]) 07/10/2012   cardioversion x 2  . Pneumonia    8th grade, without recurrence  . Ureteral stone     Patient Active Problem List   Diagnosis Date Noted  . Shortness of breath   . Obesity 01/17/2013  . HTN (hypertension) 01/17/2013  . Snoring 01/17/2013  . Atrial fibrillation ([place]) 07/14/2012    Past Surgical History:  Procedure Laterality Date  . ATRIAL FIBRILLATION ABLATION  04/19/2014  . ATRIAL FIBRILLATION ABLATION N/A 04/19/2014   Procedure: ATRIAL FIBRILLATION ABLATION;  Surgeon: [name], MD;  Location: [facility] CATH LAB;  Service: Cardiovascular;  Laterality: N/A;  . CARDIOVERSION N/A 07/14/2012   Procedure: CARDIOVERSION;  Surgeon: [name], MD;  Location: [facility] ENDOSCOPY;  Service: Cardiovascular;  Laterality: N/A;  . CARDIOVERSION N/A 09/16/2012   Procedure: CARDIOVERSION;  Surgeon: [name], MD;  Location: [facility] ENDOSCOPY;   Service: Cardiovascular;  Laterality: N/A;  . CARDIOVERSION N/A 03/14/2014   Procedure: CARDIOVERSION;  Surgeon: [name], MD;  Location: [facility];  Service: Cardiovascular;  Laterality: N/A;  . COLONOSCOPY WITH PROPOFOL N/A 01/31/2013   Procedure: COLONOSCOPY WITH PROPOFOL;  Surgeon: [name], MD;  Location: [facility] ENDOSCOPY;  Service: Endoscopy;  Laterality: N/A;  . FRACTURE SURGERY    . ORIF METACARPAL FRACTURE Right   . SHOULDER ARTHROSCOPY W/ ROTATOR CUFF REPAIR Bilateral   . TEE WITHOUT CARDIOVERSION N/A 07/14/2012   Procedure: TRANSESOPHAGEAL ECHOCARDIOGRAM (TEE);  Surgeon: [name], MD;  Location: [facility] ENDOSCOPY;  Service: Cardiovascular;  Laterality: N/A;  . TEE WITHOUT CARDIOVERSION N/A 09/16/2012   Procedure: TRANSESOPHAGEAL ECHOCARDIOGRAM (TEE);  Surgeon: [name], MD;  Location: [facility] ENDOSCOPY;  Service: Cardiovascular;  Laterality: N/A;  . TEE WITHOUT CARDIOVERSION N/A 04/18/2014   Procedure: TRANSESOPHAGEAL ECHOCARDIOGRAM (TEE);  Surgeon: [name], MD;  Location: [facility] ENDOSCOPY;  Service: Cardiovascular;  Laterality: N/A;       Home Medications    Prior to Admission medications   Medication Sig Start Date End Date Taking? Authorizing Provider  aspirin EC 81 MG tablet Take 1 tablet (81 mg total) by mouth daily. 10/22/16   Eileen Stanford, PA-C  cholecalciferol (VITAMIN D) 1000 units tablet Take 1,000 Units by mouth daily.    [provider]  diltiazem (CARDIZEM CD) 120 MG 24 hr capsule Take 1 tablet by mouth daily, take 1 tablet as needed for palpitations 11/16/16   Jerline Pain, MD  Glucosamine-Chondroit-Vit C-Mn (GLUCOSAMINE 1500 COMPLEX) CAPS Take 1 capsule by mouth daily.    [provider]  imiquimod (ALDARA) 5 % cream Apply 1 application topically daily.  04/02/14   [provider]  Multiple Vitamin (MULTIVITAMIN WITH MINERALS) TABS tablet Take 1 tablet by mouth daily.    [provider]  olmesartan-hydrochlorothiazide  (BENICAR HCT) 40-25 MG tablet Take 1 tablet by mouth daily. 11/16/16   Jerline Pain, MD  oxymetazoline (AFRIN) 0.05 % nasal spray Place 1 spray into both nostrils 2 (two) times daily as needed for congestion.    [provider]  vitamin C (ASCORBIC ACID) 500 MG tablet Take 500 mg by mouth daily.    [provider]    Family History Family History  Problem Relation Age of Onset  . Hypertension Mother     Social History Social History  Substance Use Topics  . Smoking status: Never Smoker  . Smokeless tobacco: Never Used  . Alcohol use 8.4 oz/week    7 Glasses of wine, 7 Shots of liquor per week     Comment: 04/19/2014 "2-3 glasses of wine or liquor per day"     Allergies   Lisinopril   Review of Systems Review of Systems  Constitutional: Negative for activity change, appetite change and fever.  Eyes: Negative for visual disturbance.  Respiratory: Negative for cough, chest tightness and shortness of breath.   Cardiovascular: Negative for chest pain and leg swelling.  Gastrointestinal: Negative for abdominal pain, nausea and vomiting.  Genitourinary: Negative for dysuria and hematuria.  Neurological: Negative for dizziness, weakness and headaches.   all other systems are negative except as noted in the HPI and PMH.     Physical Exam Updated Vital Signs BP (!) 161/91 (BP Location: Right Arm)   Pulse 87   Temp 98 F (36.7 C) (Oral)   Resp 18   Ht 6' (1.829 m)   Wt 108.9 kg (240 lb)   SpO2 92%   BMI 32.55 kg/m   Physical Exam  Constitutional: He is oriented to person, place, and time. He appears well-developed and well-nourished. No distress.  HENT:  Head: Normocephalic and atraumatic.  Mouth/Throat: Oropharynx is clear and moist. No oropharyngeal exudate.  Diffuse swelling of tongue. Posterior pharynx is visible and appears normal. There is no lip swelling. Controlling secretions. Muffled voice.  Eyes: Pupils are equal, round, and reactive to light.  Conjunctivae and EOM are normal.  Neck: Normal range of motion. Neck supple.  No meningismus.  Cardiovascular: Normal rate, regular rhythm, normal heart sounds and intact distal pulses.   No murmur heard. Pulmonary/Chest: Effort normal and breath sounds normal. No respiratory distress.  Abdominal: Soft. There is no tenderness. There is no rebound and no guarding.  Musculoskeletal: Normal range of motion. He exhibits no edema or tenderness.  Neurological: He is alert and oriented to person, place, and time. No cranial nerve deficit. He exhibits normal muscle tone. Coordination normal.   5/5 strength throughout. CN 2-12 intact.Equal grip strength.   Skin: Skin is warm.  Psychiatric: He has a normal mood and affect. His behavior is normal.  Nursing note and vitals reviewed.    ED Treatments / Results  Labs (all labs ordered are listed, but only abnormal results are displayed) Labs Reviewed - No data to display  EKG  EKG Interpretation  Date/Time:  Sunday November 22 2016 04:22:19 EDT Ventricular Rate:  78 PR Interval:    QRS Duration: 95 QT Interval:  372 QTC  Calculation: 424 R Axis:   24 Text Interpretation:  Sinus rhythm Low voltage, precordial leads Abnormal R-wave progression, early transition No significant change was found Confirmed by Ezequiel Essex [phone]) on 11/22/2016 5:22:09 AM       Radiology No results found.  Procedures Procedures (including critical care time)  Medications Ordered in ED Medications  methylPREDNISolone sodium succinate (SOLU-MEDROL) 125 mg/2 mL injection 125 mg (not administered)  famotidine (PEPCID) IVPB 20 mg premix (not administered)  0.9 %  sodium chloride infusion ( Intravenous New Bag/Given 11/22/16 0424)  diphenhydrAMINE (BENADRYL) injection 25 mg (not administered)     Initial Impression / Assessment and Plan / ED Course  I have reviewed the triage vital signs and the nursing notes.  Pertinent labs & imaging results that were  available during my care of the patient were reviewed by me and considered in my medical decision making (see chart for details).     Patient with angioedema. Likely secondary to angiotensin receptor blocker use. No chest pain or shortness of breath. Lungs clear. Tolerating secretions. Used epi pen at home. Will give steroids and antihistamines, observe closely.  Recheck 7:30 AM, 4 hours since epi injection.Tongue swelling has much improved. Patient feels back to baseline. No posterior pharynx swelling. No chest pain or shortness of breath. Patient instructed he cannot take any ACE inhibitors or ARBs. Added to his medication and allergy list.  Give short course of steroids, epipen refilled.  Follow up with PCP and make him aware of angioedema.  Return precautions discussed.  CRITICAL CARE Performed by: Ezequiel Essex Total critical care time: 45 minutes Critical care time was exclusive of separately billable procedures and treating other patients. Critical care was necessary to treat or prevent imminent or life-threatening deterioration. Critical care was time spent personally by me on the following activities: development of treatment plan with patient and/or surrogate as well as nursing, discussions with consultants, evaluation of patient's response to treatment, examination of patient, obtaining history from patient or surrogate, ordering and performing treatments and interventions, ordering and review of laboratory studies, ordering and review of radiographic studies, pulse oximetry and re-evaluation of patient's condition.   Final Clinical Impressions(s) / ED Diagnoses   Final diagnoses:  Angioedema, initial encounter    New Prescriptions New Prescriptions   No medications on file     Ezequiel Essex, MD 11/22/16 [phone]

## 2016-11-23 ENCOUNTER — Telehealth: Payer: Self-pay | Admitting: Cardiology

## 2016-11-23 NOTE — Telephone Encounter (Signed)
New message   Pt states ER took him off of of his BP medicine and he is trying to get some BP medicine and know that it is stable enough for him to leave on his trip the day after tomorrow.

## 2016-11-23 NOTE — Telephone Encounter (Signed)
Pt states he was in ED yesterday, was told he was having an allergic reaction to olmesartan, told to stop it, follow-up with PCP today.  Pt states he went to his PCP today, was told to increase Diltiazem from 120 mg daily to 180 mg daily for BP control.

## 2016-11-23 NOTE — Telephone Encounter (Signed)
Pt states he is going out of country 11/25/16 and is requesting appt here to follow-up on BP and medication change before he leaves on 11/25/16. Pt states he does not have a way to check his BP.Marland Kitchen  Pt has been scheduled to see Ermalinda Barrios, PA 11/24/16.

## 2016-11-24 ENCOUNTER — Ambulatory Visit (INDEPENDENT_AMBULATORY_CARE_PROVIDER_SITE_OTHER): Payer: 59 | Admitting: Physician Assistant

## 2016-11-24 ENCOUNTER — Encounter: Payer: Self-pay | Admitting: Physician Assistant

## 2016-11-24 VITALS — BP 126/68 | HR 60 | Ht 72.0 in | Wt 250.8 lb

## 2016-11-24 DIAGNOSIS — I10 Essential (primary) hypertension: Secondary | ICD-10-CM

## 2016-11-24 DIAGNOSIS — I480 Paroxysmal atrial fibrillation: Secondary | ICD-10-CM | POA: Diagnosis not present

## 2016-11-24 NOTE — Progress Notes (Signed)
Cardiology Office Note    Date:  11/24/2016   ID:  Reginald [name], DOB [birth year], MRN [id number]  PCP:  [name], MD  Cardiologist: Dr. Marlou Porch  Chief Complaint  Patient presents with  . Follow-up    BP/medication changes    History of Present Illness:  Reginald [name] is a 66 y.o. male with history of hypertension, PAF status post RFA 04/2015. Taken off Eliquis given CHADSVASC=1. He is a pilot and requires routine Holter monitors. Last one was 09/2016 and showed no atrial fibrillation.  Patient was in the ER 11/22/16 with angioedema. He woke up from sleep at 3:15 AM with tongue swelling. He took 2 Benadryl and gave himself an epinephrine injection. He states he had angioedema in [place] 3 weeks prior to that while he was traveling treated with an epi pen.He was on Valsartan at the time. It was then recalled and he was switched to olmesartan and once again developed the angioedema. He is off all ARB's/ACEI and BP is controlled on higher dose Cardizem. He is traveling to [place] tomorrow and wants to make sure BP is controlled and that we concur he had a reaction to ARB/ACEI. No rare foods or other know allergies. While in [place] had just been to [facility].    Past Medical History:  Diagnosis Date  . Hypertension   . Impaired fasting glucose   . Persistent atrial fibrillation ([place]) 07/10/2012   cardioversion x 2  . Pneumonia    8th grade, without recurrence  . Ureteral stone     Past Surgical History:  Procedure Laterality Date  . ATRIAL FIBRILLATION ABLATION  04/19/2014  . ATRIAL FIBRILLATION ABLATION N/A 04/19/2014   Procedure: ATRIAL FIBRILLATION ABLATION;  Surgeon: [name], MD;  Location: [facility] CATH LAB;  Service: Cardiovascular;  Laterality: N/A;  . CARDIOVERSION N/A 07/14/2012   Procedure: CARDIOVERSION;  Surgeon: [name], MD;  Location: [facility] ENDOSCOPY;  Service: Cardiovascular;  Laterality: N/A;  . CARDIOVERSION N/A 09/16/2012   Procedure: CARDIOVERSION;   Surgeon: [name], MD;  Location: [facility] ENDOSCOPY;  Service: Cardiovascular;  Laterality: N/A;  . CARDIOVERSION N/A 03/14/2014   Procedure: CARDIOVERSION;  Surgeon: [name], MD;  Location: [facility];  Service: Cardiovascular;  Laterality: N/A;  . COLONOSCOPY WITH PROPOFOL N/A 01/31/2013   Procedure: COLONOSCOPY WITH PROPOFOL;  Surgeon: [name], MD;  Location: [facility] ENDOSCOPY;  Service: Endoscopy;  Laterality: N/A;  . FRACTURE SURGERY    . ORIF METACARPAL FRACTURE Right   . SHOULDER ARTHROSCOPY W/ ROTATOR CUFF REPAIR Bilateral   . TEE WITHOUT CARDIOVERSION N/A 07/14/2012   Procedure: TRANSESOPHAGEAL ECHOCARDIOGRAM (TEE);  Surgeon: [name], MD;  Location: [facility] ENDOSCOPY;  Service: Cardiovascular;  Laterality: N/A;  . TEE WITHOUT CARDIOVERSION N/A 09/16/2012   Procedure: TRANSESOPHAGEAL ECHOCARDIOGRAM (TEE);  Surgeon: [name], MD;  Location: [facility] ENDOSCOPY;  Service: Cardiovascular;  Laterality: N/A;  . TEE WITHOUT CARDIOVERSION N/A 04/18/2014   Procedure: TRANSESOPHAGEAL ECHOCARDIOGRAM (TEE);  Surgeon: [name], MD;  Location: [facility] ENDOSCOPY;  Service: Cardiovascular;  Laterality: N/A;    Current Medications: Current Meds  Medication Sig  . aspirin EC 81 MG tablet Take 1 tablet (81 mg total) by mouth daily.  . cholecalciferol (VITAMIN D) 1000 units tablet Take 1,000 Units by mouth daily.  Marland Kitchen diltiazem (CARDIZEM CD) 180 MG 24 hr capsule Take 180 mg by mouth daily.  . Glucosamine-Chondroit-Vit C-Mn (GLUCOSAMINE 1500 COMPLEX) CAPS Take 1 capsule by mouth daily.  . Multiple Vitamin (MULTIVITAMIN WITH MINERALS)  TABS tablet Take 1 tablet by mouth daily.  . predniSONE (DELTASONE) 50 MG tablet Take 50 mg by mouth daily.  . vitamin C (ASCORBIC ACID) 500 MG tablet Take 500 mg by mouth daily.     Allergies:   Olmesartan and Lisinopril   Social History   Social History  . Marital status: Married    Spouse name: N/A  . Number of children: N/A  . Years of education: N/A   Social  History Main Topics  . Smoking status: Never Smoker  . Smokeless tobacco: Never Used  . Alcohol use 8.4 oz/week    7 Glasses of wine, 7 Shots of liquor per week     Comment: 04/19/2014 "2-3 glasses of wine or liquor per day"  . Drug use: No  . Sexual activity: Not Currently   Other Topics Concern  . None   Social History Narrative   Lives in [place] with spouse.  2 children (ages 28 and 58).   Owns [employer]     Family History:  The patient's family history includes Hypertension in his mother.   ROS:   Please see the history of present illness.    Review of Systems  Constitution: Negative.  HENT: Negative.   Cardiovascular: Negative.   Respiratory: Negative.   Endocrine: Negative.   Hematologic/Lymphatic: Negative.   Musculoskeletal: Negative.   Gastrointestinal: Negative.   Genitourinary: Negative.   Neurological: Negative.    All other systems reviewed and are negative.   PHYSICAL EXAM:   VS:  BP 126/68   Pulse 60   Ht 6' (1.829 m)   Wt 250 lb 12.8 oz (113.8 kg)   BMI 34.01 kg/m   Physical Exam  GEN: Well nourished, well developed, in no acute distress  Neck: no JVD, carotid bruits, or masses Cardiac:RRR; no murmurs, rubs, or gallops  Respiratory:  clear to auscultation bilaterally, normal work of breathing GI: soft, nontender, nondistended, + BS Ext: without cyanosis, clubbing, or edema, Good distal pulses bilaterally Neuro:  Alert and Oriented x 3 Psych: euthymic mood, full affect  Wt Readings from Last 3 Encounters:  11/24/16 250 lb 12.8 oz (113.8 kg)  11/22/16 240 lb (108.9 kg)  10/22/16 248 lb 1.9 oz (112.5 kg)      Studies/Labs Reviewed:   EKG:  EKG is not ordered today.   Recent Labs: 09/03/2016: ALT 29; BUN 11; Creatinine, Ser 0.63; Hemoglobin 14.8; Platelets 183; Potassium 4.3; Sodium 137   Lipid Panel No results found for: CHOL, TRIG, HDL, CHOLHDL, VLDL, LDLCALC, LDLDIRECT  Additional studies/ records that were reviewed  today include:   2D ECHO: 09/18/2013 LV EF: 60% -   65% Study Conclusions - Left ventricle: The cavity size was normal. There was moderate   concentric hypertrophy. Systolic function was normal. The   estimated ejection fraction was in the range of 60% to 65%. Wall   motion was normal; there were no regional wall motion   abnormalities. Doppler parameters are consistent with abnormal   left ventricular relaxation (grade 1 diastolic dysfunction).   There was no evidence of elevated ventricular filling pressure by   Doppler parameters. - Aortic valve: Trileaflet; normal thickness leaflets. - Aortic root: The aortic root was normal in size. - Mitral valve: Structurally normal valve. There was no   regurgitation. - Left atrium: The atrium was mildly dilated. - Right ventricle: Systolic function was normal. - Tricuspid valve: There was no regurgitation. - Pulmonic valve: Structurally normal valve. - Pulmonary arteries: Systolic  pressure was within the normal   range. - Pericardium, extracardiac: There was no pericardial effusion. Impressions - Normal biventricular size and function. Mildly dilated left   atrium. Abnormal relaxation. No significant valvular abnormality.   2D ECHO: 04/18/2014 LV EF: 55% -   65% Study Conclusions - Left ventricle: Systolic function was normal. The estimated   ejection fraction was in the range of 55% to 65%. Wall motion was   normal; there were no regional wall motion abnormalities. - Left atrium: The atrium was mildly dilated. No evidence of   thrombus in the atrial cavity or appendage. No evidence of   thrombus in the atrial cavity or appendage. - Right atrium: No evidence of thrombus in the atrial cavity or   appendage. 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  Rare PVCs (32), rare PACs (14)  No atrial fibrillation  Brief 5-6 beat run of paroxysmal atrial tachycardia, benign  Reassuring Holter monitor.            ASSESSMENT:    1. Essential hypertension   2. Paroxysmal atrial fibrillation (HCC)      PLAN:  In order of problems listed above:  Hypertension was well controlled in the past with valsartan/HCTZ for 12-15 yrs. Developed Angioedema in [place] treated with [place]. He was switched to olmesartan after valsartan was recalled and he had developed angioedema 3 weeks later as described above.Now deemed allergic to all ARB's/ACEI.Discussed with Dr. Acie Fredrickson and Pharmacist. Recommend carrying an Epi pen with him which he has. Should be stable to travel for pleasure to [place] tomorrow(not pilot). BP now controlled on Cardizem 180 mg daily.  PAF status post RFA with no recurrence since ablation 04/2015. Recent Holter 09/2016 with no A. fib. 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 CHADSVASC=2 for Htn and age. Now on ASA 81 mg daily.  Medication Adjustments/Labs and Tests Ordered: Current medicines are reviewed at length with the patient today.  Concerns regarding medicines are outlined above.  Medication changes, Labs and Tests ordered today are listed in the Patient Instructions below. There are no Patient Instructions on file for this visit.   Sumner Boast, PA-C  11/24/2016 12:09 PM    [facility] [place], [place], [place]  [address] Phone: [phone]; Fax: [phone]

## 2016-11-24 NOTE — Patient Instructions (Signed)
Medication Instructions:  Your physician recommends that you continue on your current medications as directed. Please refer to the Current Medication list given to you today.   Labwork: None Ordered   Testing/Procedures: None Ordered  Follow-Up: Your physician recommends that you schedule a follow-up appointment in: with Dr. Marlou Porch as scheduled.   Any Other Special Instructions Will Be Listed Below (If Applicable).     If you need a refill on your cardiac medications before your next appointment, please call your pharmacy.

## 2016-12-02 NOTE — Telephone Encounter (Signed)
No need to anticoagulation at this time.

## 2017-03-28 ENCOUNTER — Telehealth: Payer: Self-pay | Admitting: Adult Health

## 2017-03-28 NOTE — Telephone Encounter (Signed)
Patient called reporting that he returned to atrial fibrillation today.  Had an ablation team and was doing well.  He had been taken off of Eliquis and has been off this for over a year.  He is concerned about recurrence of his atrial fibrillation and would like to stop by the office and have an EKG and be seen.  I have advised him that our triage nurses will give him a call to arrange follow-up.  He has been advised to restart Eliquis for now until being seen as he is now having paroxysmal atrial fibrillation.  Will need to have documentation of heart rhythm and rate.  He is normally seen by Dr. Marlou Porch.

## 2017-03-29 ENCOUNTER — Telehealth: Payer: Self-pay | Admitting: Cardiology

## 2017-03-29 NOTE — Telephone Encounter (Signed)
Reviewed information with Dr Marlou Porch who ordered for pt to be seen in the [facility].  appt scheduled for12/17 and pt is agreeable.  He reports he can "just feel it" and that according to his fitbit his rate is bouncing around and is staying in the 80's.  He restarted Elquis yesterday around 12N per Jory Sims, NP (see phone note). Pt would like to have another At Fib ablation if this is a possibility.

## 2017-03-29 NOTE — Telephone Encounter (Signed)
Patient calling, states that he went back into AFIB yesterday. Patient states that if Dr. Marlou Porch wants to do cardioversion he would like to schedule this week.

## 2017-03-30 ENCOUNTER — Encounter (HOSPITAL_COMMUNITY): Payer: Self-pay | Admitting: Nurse Practitioner

## 2017-03-30 ENCOUNTER — Telehealth (HOSPITAL_COMMUNITY): Payer: Self-pay | Admitting: *Deleted

## 2017-03-30 ENCOUNTER — Ambulatory Visit (HOSPITAL_COMMUNITY)
Admission: RE | Admit: 2017-03-30 | Discharge: 2017-03-30 | Disposition: A | Discharge status: Home / Self Care | Payer: 59 | Source: Ambulatory Visit | Attending: Nurse Practitioner | Admitting: Nurse Practitioner

## 2017-03-30 VITALS — BP 136/88 | HR 101 | Ht 72.0 in | Wt 250.0 lb

## 2017-03-30 DIAGNOSIS — I10 Essential (primary) hypertension: Secondary | ICD-10-CM | POA: Diagnosis not present

## 2017-03-30 DIAGNOSIS — Z7982 Long term (current) use of aspirin: Secondary | ICD-10-CM | POA: Diagnosis not present

## 2017-03-30 DIAGNOSIS — Z79899 Other long term (current) drug therapy: Secondary | ICD-10-CM | POA: Diagnosis not present

## 2017-03-30 DIAGNOSIS — I480 Paroxysmal atrial fibrillation: Secondary | ICD-10-CM | POA: Diagnosis not present

## 2017-03-30 DIAGNOSIS — Z9889 Other specified postprocedural states: Secondary | ICD-10-CM | POA: Insufficient documentation

## 2017-03-30 LAB — CBC
HCT: 49.3 % (ref 39.0–52.0)
Hemoglobin: 17.2 g/dL — ABNORMAL HIGH (ref 13.0–17.0)
MCH: 30.8 pg (ref 26.0–34.0)
MCHC: 34.9 g/dL (ref 30.0–36.0)
MCV: 88.2 fL (ref 78.0–100.0)
Platelets: 180 10*3/uL (ref 150–400)
RBC: 5.59 MIL/uL (ref 4.22–5.81)
RDW: 13 % (ref 11.5–15.5)
WBC: 6.2 10*3/uL (ref 4.0–10.5)

## 2017-03-30 LAB — BASIC METABOLIC PANEL
Anion gap: 6 (ref 5–15)
BUN: 12 mg/dL (ref 6–20)
CALCIUM: 9 mg/dL (ref 8.9–10.3)
CO2: 23 mmol/L (ref 22–32)
CREATININE: 0.66 mg/dL (ref 0.61–1.24)
Chloride: 108 mmol/L (ref 101–111)
GFR calc non Af Amer: >60 mL/min (ref >60)
Glucose, Bld: 122 mg/dL — ABNORMAL HIGH (ref 65–99)
Potassium: 4.4 mmol/L (ref 3.5–5.1)
SODIUM: 137 mmol/L (ref 135–145)

## 2017-03-30 MED ORDER — METOPROLOL SUCCINATE ER 25 MG PO TB24: 25.0000 mg | ORAL_TABLET | Freq: Every day | ORAL | 3 refills | Status: DC

## 2017-03-30 MED ORDER — APIXABAN 5 MG PO TABS: 5.0000 mg | ORAL_TABLET | Freq: Two times a day (BID) | ORAL | 2 refills | Status: DC

## 2017-03-30 NOTE — Patient Instructions (Addendum)
Cardioversion scheduled for Friday, December 21st  - Arrive at the [facility] and go to admitting at 12:30PM  -Do not eat or drink anything after midnight the night prior to your procedure.  - Take all your medication with a sip of water prior to arrival.  - You will not be able to drive home after your procedure.  Start Metoprolol (toprol) tonight once a day at bedtime -- stop on Thursday.  Scheduling will be in touch with you to see Dr. [name] after cardioversion.

## 2017-03-30 NOTE — H&P (View-Only) (Signed)
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 male with a h/o HTN, afib with ablation in 2016. He has  been doing well without any recurrent afib but noted irregular heart beat this past Sunday He took an eliquis Sunday at noon and has taken 4 doses since then.. He has stopped eliquis after ablation in 2016. He had been out of town for the last week and had been eating and sleeping differently. He had more alcohol per usual but has been trying to keep his alcohol consumption down. 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 His wife reports that he does snore and she has witness some apnea spells. He would like a cardioversion as soon as possible and would also like to discuss with Dr. [name] repeat ablation on f/u.Marland Kitchen  Today, he denies symptoms of palpitations, chest pain, shortness of breath, orthopnea, PND, lower extremity edema, dizziness, presyncope, syncope, or neurologic sequela. The patient is tolerating medications without difficulties and is otherwise without complaint today.   Past Medical History:  Diagnosis Date  . Hypertension   . Impaired fasting glucose   . Persistent atrial fibrillation ([place]) 07/10/2012   cardioversion x 2  . Pneumonia    8th grade, without recurrence  . Ureteral stone    Past Surgical History:  Procedure Laterality Date  . ATRIAL FIBRILLATION ABLATION  04/19/2014  . ATRIAL FIBRILLATION ABLATION N/A 04/19/2014   Procedure: ATRIAL FIBRILLATION ABLATION;  Surgeon: [name], MD;  Location: [facility] CATH LAB;  Service: Cardiovascular;  Laterality: N/A;  . CARDIOVERSION N/A 07/14/2012   Procedure: CARDIOVERSION;  Surgeon: [name], MD;  Location: [facility] ENDOSCOPY;  Service: Cardiovascular;  Laterality: N/A;  . CARDIOVERSION N/A 09/16/2012   Procedure: CARDIOVERSION;  Surgeon: [name], MD;  Location: [facility] ENDOSCOPY;  Service: Cardiovascular;  Laterality:  N/A;  . CARDIOVERSION N/A 03/14/2014   Procedure: CARDIOVERSION;  Surgeon: [name], MD;  Location: [place];  Service: Cardiovascular;  Laterality: N/A;  . COLONOSCOPY WITH PROPOFOL N/A 01/31/2013   Procedure: COLONOSCOPY WITH PROPOFOL;  Surgeon: [name], MD;  Location: [facility] ENDOSCOPY;  Service: Endoscopy;  Laterality: N/A;  . FRACTURE SURGERY    . ORIF METACARPAL FRACTURE Right   . SHOULDER ARTHROSCOPY W/ ROTATOR CUFF REPAIR Bilateral   . TEE WITHOUT CARDIOVERSION N/A 07/14/2012   Procedure: TRANSESOPHAGEAL ECHOCARDIOGRAM (TEE);  Surgeon: [name], MD;  Location: [facility] ENDOSCOPY;  Service: Cardiovascular;  Laterality: N/A;  . TEE WITHOUT CARDIOVERSION N/A 09/16/2012   Procedure: TRANSESOPHAGEAL ECHOCARDIOGRAM (TEE);  Surgeon: [name], MD;  Location: [facility] ENDOSCOPY;  Service: Cardiovascular;  Laterality: N/A;  . TEE WITHOUT CARDIOVERSION N/A 04/18/2014   Procedure: TRANSESOPHAGEAL ECHOCARDIOGRAM (TEE);  Surgeon: [name], MD;  Location: [facility] ENDOSCOPY;  Service: Cardiovascular;  Laterality: N/A;    Current Outpatient Medications  Medication Sig Dispense Refill  . aspirin EC 81 MG tablet Take 1 tablet (81 mg total) by mouth daily. 90 tablet 3  . diltiazem (CARDIZEM CD) 180 MG 24 hr capsule Take 180 mg by mouth daily.    . Glucosamine-Chondroit-Vit C-Mn (GLUCOSAMINE 1500 COMPLEX) CAPS Take 1 capsule by mouth daily.    . Multiple Vitamin (MULTIVITAMIN WITH MINERALS) TABS tablet Take 1 tablet by mouth daily.    . vitamin C (ASCORBIC ACID) 500 MG tablet Take 500 mg by mouth daily.    Marland Kitchen apixaban (ELIQUIS) 5 MG TABS  tablet Take 1 tablet (5 mg total) by mouth 2 (two) times daily. 60 tablet 2  . metoprolol succinate (TOPROL XL) 25 MG 24 hr tablet Take 1 tablet (25 mg total) by mouth at bedtime. 30 tablet 3   No current facility-administered medications for this encounter.     Allergies  Allergen Reactions  . Lisinopril Cough    Social History   Socioeconomic History  .  Marital status: Married    Spouse name: Not on file  . Number of children: Not on file  . Years of education: Not on file  . Highest education level: Not on file  Social Needs  . Financial resource strain: Not on file  . Food insecurity - worry: Not on file  . Food insecurity - inability: Not on file  . Transportation needs - medical: Not on file  . Transportation needs - non-medical: Not on file  Occupational History  . Not on file  Tobacco Use  . Smoking status: Never Smoker  . Smokeless tobacco: Never Used  Substance and Sexual Activity  . Alcohol use: Yes    Alcohol/week: 8.4 oz    Types: 7 Glasses of wine, 7 Shots of liquor per week    Comment: 04/19/2014 "2-3 glasses of wine or liquor per day"  . Drug use: No  . Sexual activity: Not Currently  Other Topics Concern  . Not on file  Social History Narrative   Lives in [place] with spouse.  2 children (ages 29 and 57).   Owns [employer]    Family History  Problem Relation Age of Onset  . Hypertension Mother     ROS- All systems are reviewed and negative except as per the HPI above  Physical Exam: Vitals:   03/30/17 1100  BP: 136/88  Pulse: (!) 101  Weight: 250 lb (113.4 kg)  Height: 6' (1.829 m)   Wt Readings from Last 3 Encounters:  03/30/17 250 lb (113.4 kg)  11/24/16 250 lb 12.8 oz (113.8 kg)  11/22/16 240 lb (108.9 kg)    Labs: Lab Results  Component Value Date   NA 137 09/03/2016   K 4.3 09/03/2016   CL 100 09/03/2016   CO2 24 09/03/2016   GLUCOSE 104 (H) 09/03/2016   BUN 11 09/03/2016   CREATININE 0.63 (L) 09/03/2016   CALCIUM 9.0 09/03/2016   No results found for: INR No results found for: CHOL, HDL, LDLCALC, TRIG   GEN- The patient is well appearing, alert and oriented x 3 today.   Head- normocephalic, atraumatic Eyes-  Sclera clear, conjunctiva pink Ears- hearing intact Oropharynx- clear Neck- supple, no JVP Lymph- no cervical lymphadenopathy Lungs- Clear to  ausculation bilaterally, normal work of breathing Heart- irregular rate and rhythm, no murmurs, rubs or gallops, PMI not laterally displaced GI- soft, NT, ND, + BS Extremities- no clubbing, cyanosis, or edema MS- no significant deformity or atrophy Skin- no rash or lesion Psych- euthymic mood, full affect Neuro- strength and sensation are intact  EKG-afib at 101 bpm, qrs int 88 ms, qtc 420 ms Epic records reviewed    Assessment and Plan: 1. Paroxysmal afib Trigger may have been travel/out of usual routine as well as stress Scheduled  for TEE guided cardioversion, 12/21 as he restarted eliquis on Sunday 12/16 Will add 25 mg metoprolol succinate at hs but hold pm before and after cardioversion as he is in the 50's in SR Consider sleep study in the future as pt states that husband snores with  some witnessed apnea Continue eliquis 5 mg for a chadsvasc score of 3(impaired glucose tolerance, HTN, age)  F/u after cardioversion with Dr. Lawrence Marseilles C. , [facility] [address] [place], [place] [address] [phone]

## 2017-03-30 NOTE — Progress Notes (Signed)
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 male with a h/o HTN, afib with ablation in 2016. He has  been doing well without any recurrent afib but noted irregular heart beat this past Sunday He took an eliquis Sunday at noon and has taken 4 doses since then.. He has stopped eliquis after ablation in 2016. He had been out of town for the last week and had been eating and sleeping differently. He had more alcohol per usual but has been trying to keep his alcohol consumption down. 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 His wife reports that he does snore and she has witness some apnea spells. He would like a cardioversion as soon as possible and would also like to discuss with Dr. [name] repeat ablation on f/u.Marland Kitchen  Today, he denies symptoms of palpitations, chest pain, shortness of breath, orthopnea, PND, lower extremity edema, dizziness, presyncope, syncope, or neurologic sequela. The patient is tolerating medications without difficulties and is otherwise without complaint today.   Past Medical History:  Diagnosis Date  . Hypertension   . Impaired fasting glucose   . Persistent atrial fibrillation ([place]) 07/10/2012   cardioversion x 2  . Pneumonia    8th grade, without recurrence  . Ureteral stone    Past Surgical History:  Procedure Laterality Date  . ATRIAL FIBRILLATION ABLATION  04/19/2014  . ATRIAL FIBRILLATION ABLATION N/A 04/19/2014   Procedure: ATRIAL FIBRILLATION ABLATION;  Surgeon: [name], MD;  Location: [facility] CATH LAB;  Service: Cardiovascular;  Laterality: N/A;  . CARDIOVERSION N/A 07/14/2012   Procedure: CARDIOVERSION;  Surgeon: [name], MD;  Location: [facility] ENDOSCOPY;  Service: Cardiovascular;  Laterality: N/A;  . CARDIOVERSION N/A 09/16/2012   Procedure: CARDIOVERSION;  Surgeon: [name], MD;  Location: [facility] ENDOSCOPY;  Service: Cardiovascular;  Laterality:  N/A;  . CARDIOVERSION N/A 03/14/2014   Procedure: CARDIOVERSION;  Surgeon: [name], MD;  Location: [place];  Service: Cardiovascular;  Laterality: N/A;  . COLONOSCOPY WITH PROPOFOL N/A 01/31/2013   Procedure: COLONOSCOPY WITH PROPOFOL;  Surgeon: [name], MD;  Location: [facility] ENDOSCOPY;  Service: Endoscopy;  Laterality: N/A;  . FRACTURE SURGERY    . ORIF METACARPAL FRACTURE Right   . SHOULDER ARTHROSCOPY W/ ROTATOR CUFF REPAIR Bilateral   . TEE WITHOUT CARDIOVERSION N/A 07/14/2012   Procedure: TRANSESOPHAGEAL ECHOCARDIOGRAM (TEE);  Surgeon: [name], MD;  Location: [facility] ENDOSCOPY;  Service: Cardiovascular;  Laterality: N/A;  . TEE WITHOUT CARDIOVERSION N/A 09/16/2012   Procedure: TRANSESOPHAGEAL ECHOCARDIOGRAM (TEE);  Surgeon: [name], MD;  Location: [facility] ENDOSCOPY;  Service: Cardiovascular;  Laterality: N/A;  . TEE WITHOUT CARDIOVERSION N/A 04/18/2014   Procedure: TRANSESOPHAGEAL ECHOCARDIOGRAM (TEE);  Surgeon: [name], MD;  Location: [facility] ENDOSCOPY;  Service: Cardiovascular;  Laterality: N/A;    Current Outpatient Medications  Medication Sig Dispense Refill  . aspirin EC 81 MG tablet Take 1 tablet (81 mg total) by mouth daily. 90 tablet 3  . diltiazem (CARDIZEM CD) 180 MG 24 hr capsule Take 180 mg by mouth daily.    . Glucosamine-Chondroit-Vit C-Mn (GLUCOSAMINE 1500 COMPLEX) CAPS Take 1 capsule by mouth daily.    . Multiple Vitamin (MULTIVITAMIN WITH MINERALS) TABS tablet Take 1 tablet by mouth daily.    . vitamin C (ASCORBIC ACID) 500 MG tablet Take 500 mg by mouth daily.    Marland Kitchen apixaban (ELIQUIS) 5 MG TABS  tablet Take 1 tablet (5 mg total) by mouth 2 (two) times daily. 60 tablet 2  . metoprolol succinate (TOPROL XL) 25 MG 24 hr tablet Take 1 tablet (25 mg total) by mouth at bedtime. 30 tablet 3   No current facility-administered medications for this encounter.     Allergies  Allergen Reactions  . Lisinopril Cough    Social History   Socioeconomic History  .  Marital status: Married    Spouse name: Not on file  . Number of children: Not on file  . Years of education: Not on file  . Highest education level: Not on file  Social Needs  . Financial resource strain: Not on file  . Food insecurity - worry: Not on file  . Food insecurity - inability: Not on file  . Transportation needs - medical: Not on file  . Transportation needs - non-medical: Not on file  Occupational History  . Not on file  Tobacco Use  . Smoking status: Never Smoker  . Smokeless tobacco: Never Used  Substance and Sexual Activity  . Alcohol use: Yes    Alcohol/week: 8.4 oz    Types: 7 Glasses of wine, 7 Shots of liquor per week    Comment: 04/19/2014 "2-3 glasses of wine or liquor per day"  . Drug use: No  . Sexual activity: Not Currently  Other Topics Concern  . Not on file  Social History Narrative   Lives in [place] with spouse.  2 children (ages 68 and 58).   Owns [employer]    Family History  Problem Relation Age of Onset  . Hypertension Mother     ROS- All systems are reviewed and negative except as per the HPI above  Physical Exam: Vitals:   03/30/17 1100  BP: 136/88  Pulse: (!) 101  Weight: 250 lb (113.4 kg)  Height: 6' (1.829 m)   Wt Readings from Last 3 Encounters:  03/30/17 250 lb (113.4 kg)  11/24/16 250 lb 12.8 oz (113.8 kg)  11/22/16 240 lb (108.9 kg)    Labs: Lab Results  Component Value Date   NA 137 09/03/2016   K 4.3 09/03/2016   CL 100 09/03/2016   CO2 24 09/03/2016   GLUCOSE 104 (H) 09/03/2016   BUN 11 09/03/2016   CREATININE 0.63 (L) 09/03/2016   CALCIUM 9.0 09/03/2016   No results found for: INR No results found for: CHOL, HDL, LDLCALC, TRIG   GEN- The patient is well appearing, alert and oriented x 3 today.   Head- normocephalic, atraumatic Eyes-  Sclera clear, conjunctiva pink Ears- hearing intact Oropharynx- clear Neck- supple, no JVP Lymph- no cervical lymphadenopathy Lungs- Clear to  ausculation bilaterally, normal work of breathing Heart- irregular rate and rhythm, no murmurs, rubs or gallops, PMI not laterally displaced GI- soft, NT, ND, + BS Extremities- no clubbing, cyanosis, or edema MS- no significant deformity or atrophy Skin- no rash or lesion Psych- euthymic mood, full affect Neuro- strength and sensation are intact  EKG-afib at 101 bpm, qrs int 88 ms, qtc 420 ms Epic records reviewed    Assessment and Plan: 1. Paroxysmal afib Trigger may have been travel/out of usual routine as well as stress Scheduled  for TEE guided cardioversion, 12/21 as he restarted eliquis on Sunday 12/16 Will add 25 mg metoprolol succinate at hs but hold pm before and after cardioversion as he is in the 50's in SR Consider sleep study in the future as pt states that husband snores with  some witnessed apnea Continue eliquis 5 mg for a chadsvasc score of 3(impaired glucose tolerance, HTN, age)  F/u after cardioversion with Dr. Lawrence Marseilles C. [place], [facility] [address] [place], [place] [address] [phone]

## 2017-03-30 NOTE — Telephone Encounter (Signed)
Preauthorization for eliquis approved through 03/30/2018

## 2017-04-02 ENCOUNTER — Ambulatory Visit (HOSPITAL_COMMUNITY): Payer: 59 | Admitting: Certified Registered Nurse Anesthetist

## 2017-04-02 ENCOUNTER — Ambulatory Visit (HOSPITAL_BASED_OUTPATIENT_CLINIC_OR_DEPARTMENT_OTHER)
Admission: RE | Admit: 2017-04-02 | Discharge: 2017-04-02 | Disposition: A | Discharge status: Home / Self Care | Payer: 59 | Source: Ambulatory Visit | Attending: Nurse Practitioner | Admitting: Nurse Practitioner

## 2017-04-02 ENCOUNTER — Other Ambulatory Visit: Payer: Self-pay

## 2017-04-02 ENCOUNTER — Encounter (HOSPITAL_COMMUNITY)
Admission: RE | Disposition: A | Discharge status: Home / Self Care | Payer: Self-pay | Source: Ambulatory Visit | Attending: Internal Medicine

## 2017-04-02 ENCOUNTER — Ambulatory Visit (HOSPITAL_COMMUNITY)
Admission: RE | Admit: 2017-04-02 | Discharge: 2017-04-02 | Disposition: A | Discharge status: Home / Self Care | Payer: 59 | Source: Ambulatory Visit | Attending: Internal Medicine | Admitting: Internal Medicine

## 2017-04-02 ENCOUNTER — Encounter (HOSPITAL_COMMUNITY): Payer: Self-pay | Admitting: *Deleted

## 2017-04-02 DIAGNOSIS — Z7982 Long term (current) use of aspirin: Secondary | ICD-10-CM | POA: Insufficient documentation

## 2017-04-02 DIAGNOSIS — I481 Persistent atrial fibrillation: Secondary | ICD-10-CM | POA: Diagnosis not present

## 2017-04-02 DIAGNOSIS — I4891 Unspecified atrial fibrillation: Secondary | ICD-10-CM

## 2017-04-02 DIAGNOSIS — Z79899 Other long term (current) drug therapy: Secondary | ICD-10-CM | POA: Insufficient documentation

## 2017-04-02 DIAGNOSIS — I480 Paroxysmal atrial fibrillation: Secondary | ICD-10-CM | POA: Diagnosis present

## 2017-04-02 DIAGNOSIS — Z6833 Body mass index (BMI) 33.0-33.9, adult: Secondary | ICD-10-CM | POA: Diagnosis not present

## 2017-04-02 DIAGNOSIS — I10 Essential (primary) hypertension: Secondary | ICD-10-CM | POA: Insufficient documentation

## 2017-04-02 DIAGNOSIS — Z7901 Long term (current) use of anticoagulants: Secondary | ICD-10-CM | POA: Insufficient documentation

## 2017-04-02 HISTORY — PX: CARDIOVERSION: SHX1299

## 2017-04-02 HISTORY — PX: TEE WITHOUT CARDIOVERSION: SHX5443

## 2017-04-02 SURGERY — ECHOCARDIOGRAM, TRANSESOPHAGEAL
Anesthesia: Monitor Anesthesia Care

## 2017-04-02 MED ORDER — PHENYLEPHRINE 40 MCG/ML (10ML) SYRINGE FOR IV PUSH (FOR BLOOD PRESSURE SUPPORT)
PREFILLED_SYRINGE | INTRAVENOUS | Status: DC | PRN
  Administered 2017-04-02: 80 ug via INTRAVENOUS

## 2017-04-02 MED ORDER — PROPOFOL 10 MG/ML IV BOLUS
INTRAVENOUS | Status: DC | PRN
  Administered 2017-04-02 (×3): 20 mg via INTRAVENOUS

## 2017-04-02 MED ORDER — SODIUM CHLORIDE 0.9 % IV SOLN: INTRAVENOUS | Status: DC

## 2017-04-02 MED ORDER — PROPOFOL 500 MG/50ML IV EMUL
INTRAVENOUS | Status: DC | PRN
  Administered 2017-04-02: 75 ug/kg/min via INTRAVENOUS

## 2017-04-02 MED ORDER — LACTATED RINGERS IV SOLN
INTRAVENOUS | Status: AC | PRN
  Administered 2017-04-02: 1000 mL via INTRAVENOUS

## 2017-04-02 MED ORDER — LIDOCAINE VISCOUS 2 % MT SOLN
OROMUCOSAL | Status: AC
  Filled 2017-04-02: qty 15

## 2017-04-02 NOTE — CV Procedure (Signed)
TEE Report   LA, LAA without masses  No PFO by color doppler  Interatrial septum bulges to R consistent with increased L atrial pressure  AV is normal  No AI  PV is normal  TV is normal  Trace TR  MV is normal  LVEF and RVEF are normal  Full report to follow

## 2017-04-02 NOTE — Anesthesia Postprocedure Evaluation (Signed)
Anesthesia Post Note  Patient: Reginald [name]  Procedure(s) Performed: TRANSESOPHAGEAL ECHOCARDIOGRAM (TEE) (N/A ) CARDIOVERSION (N/A )     Anesthesia Type: MAC  Patient discharged prior to post op visit. No reported anesthetic complications. Post op telephone call made.             Midge Minium

## 2017-04-02 NOTE — Discharge Instructions (Signed)
TEE ° °YOU HAD AN CARDIAC PROCEDURE TODAY: Refer to the procedure report and other information in the discharge instructions given to you for any specific questions about what was found during the examination. If this information does not answer your questions, please call [facility] office at [phone] to clarify.  ° °DIET: Your first meal following the procedure should be a light meal and then it is ok to progress to your normal diet. A half-sandwich or bowl of soup is an example of a good first meal. Heavy or fried foods are harder to digest and may make you feel nauseous or bloated. Drink plenty of fluids but you should avoid alcoholic beverages for 24 hours. If you had a esophageal dilation, please see attached instructions for diet.  ° °ACTIVITY: Your care partner should take you home directly after the procedure. You should plan to take it easy, moving slowly for the rest of the day. You can resume normal activity the day after the procedure however YOU SHOULD NOT DRIVE, use power tools, machinery or perform tasks that involve climbing or major physical exertion for 24 hours (because of the sedation medicines used during the test).  ° °SYMPTOMS TO REPORT IMMEDIATELY: °A cardiologist can be reached at any hour. Please call [phone] for any of the following symptoms:  °Vomiting of blood or coffee ground material  °New, significant abdominal pain  °New, significant chest pain or pain under the shoulder blades  °Painful or persistently difficult swallowing  °New shortness of breath  °Black, tarry-looking or red, bloody stools ° °FOLLOW UP:  °Please also call with any specific questions about appointments or follow up tests. ° °Electrical Cardioversion, Care After °This sheet gives you information about how to care for yourself after your procedure. Your health care provider may also give you more specific instructions. If you have problems or questions, contact your health care provider. °What can I  expect after the procedure? °After the procedure, it is common to have: °· Some redness on the skin where the shocks were given. ° °Follow these instructions at home: °· Do not drive for 24 hours if you were given a medicine to help you relax (sedative). °· Take over-the-counter and prescription medicines only as told by your health care provider. °· Ask your health care provider how to check your pulse. Check it often. °· Rest for 48 hours after the procedure or as told by your health care provider. °· Avoid or limit your caffeine use as told by your health care provider. °Contact a health care provider if: °· You feel like your heart is beating too quickly or your pulse is not regular. °· You have a serious muscle cramp that does not go away. °Get help right away if: °· You have discomfort in your chest. °· You are dizzy or you feel faint. °· You have trouble breathing or you are short of breath. °· Your speech is slurred. °· You have trouble moving an arm or leg on one side of your body. °· Your fingers or toes turn cold or blue. °This information is not intended to replace advice given to you by your health care provider. Make sure you discuss any questions you have with your health care provider. °Document Released: 01/18/2013 Document Revised: 11/01/2015 Document Reviewed: 10/04/2015 °Elsevier Interactive Patient Education © 2018 [employer] Inc[employer] ° °

## 2017-04-02 NOTE — Interval H&P Note (Signed)
History and Physical Interval Note:  04/02/2017 12:33 PM  Reginald [name]  has presented today for surgery, with the diagnosis of AFIB  The various methods of treatment have been discussed with the patient and family. After consideration of risks, benefits and other options for treatment, the patient has consented to  Procedure(s): TRANSESOPHAGEAL ECHOCARDIOGRAM (TEE) (N/A) CARDIOVERSION (N/A) as a surgical intervention .  The patient's history has been reviewed, patient examined, no change in status, stable for surgery.  I have reviewed the patient's chart and labs.  Questions were answered to the patient's satisfaction.     Dorris Carnes

## 2017-04-02 NOTE — Transfer of Care (Signed)
Immediate Anesthesia Transfer of Care Note  Patient: Reginald [name]  Procedure(s) Performed: TRANSESOPHAGEAL ECHOCARDIOGRAM (TEE) (N/A ) CARDIOVERSION (N/A )  Patient Location: Endoscopy Unit  Anesthesia Type:MAC  Level of Consciousness: drowsy and patient cooperative  Airway & Oxygen Therapy: Patient Spontanous Breathing and Patient connected to nasal cannula oxygen  Post-op Assessment: Report given to RN, Post -op Vital signs reviewed and stable and Patient moving all extremities X 4  Post vital signs: Reviewed and stable  Last Vitals:  Vitals:   04/02/17 1248 04/02/17 1250  BP: (!) 157/74   Pulse: 91 97  Resp: 14 20  Temp: 36.6 C   SpO2: 97% 95%    Last Pain:  Vitals:   04/02/17 1248  TempSrc: Oral         Complications: No apparent anesthesia complications

## 2017-04-02 NOTE — CV Procedure (Signed)
Cardioversion   Patient anesthetized by anesthesia with propofol  With pads in AP position, patient cardioverted with 200 J synchronized biphasic energy to SR   Procedure without complication  12 lead EKG pending

## 2017-04-02 NOTE — Interval H&P Note (Signed)
History and Physical Interval Note:  04/02/2017 1:33 PM  Reginald [name]  has presented today for surgery, with the diagnosis of AFIB  The various methods of treatment have been discussed with the patient and family. After consideration of risks, benefits and other options for treatment, the patient has consented to  Procedure(s): TRANSESOPHAGEAL ECHOCARDIOGRAM (TEE) (N/A) CARDIOVERSION (N/A) as a surgical intervention .  The patient's history has been reviewed, patient examined, no change in status, stable for surgery.  I have reviewed the patient's chart and labs.  Questions were answered to the patient's satisfaction.     Dorris Carnes

## 2017-04-02 NOTE — Anesthesia Preprocedure Evaluation (Addendum)
Anesthesia Evaluation  Patient identified by MRN, date of birth, ID band Patient awake    Reviewed: Allergy & Precautions, NPO status , Patient's Chart, lab work & pertinent test results, reviewed documented beta blocker date and time   History of Anesthesia Complications Negative for: history of anesthetic complications  Airway Mallampati: II  TM Distance: >3 FB Neck ROM: Full    Dental  (+) Dental Advisory Given   Pulmonary    breath sounds clear to auscultation       Cardiovascular hypertension, Pt. on home beta blockers and Pt. on medications + dysrhythmias Atrial Fibrillation  Rhythm:Irregular Rate:Normal     Neuro/Psych negative neurological ROS     GI/Hepatic negative GI ROS, Neg liver ROS,   Endo/Other  Morbid obesity  Renal/GU negative Renal ROS     Musculoskeletal   Abdominal (+) + obese,   Peds  Hematology  (+) Blood dyscrasia (eliquis), ,   Anesthesia Other Findings   Reproductive/Obstetrics                            Anesthesia Physical Anesthesia Plan  ASA: III  Anesthesia Plan: MAC   Post-op Pain Management:    Induction:   PONV Risk Score and Plan: 1  Airway Management Planned: Natural Airway and Nasal Cannula  Additional Equipment:   Intra-op Plan:   Post-operative Plan:   Informed Consent: I have reviewed the patients History and Physical, chart, labs and discussed the procedure including the risks, benefits and alternatives for the proposed anesthesia with the patient or authorized representative who has indicated his/her understanding and acceptance.   Dental advisory given  Plan Discussed with: CRNA and Surgeon  Anesthesia Plan Comments:         Anesthesia Quick Evaluation

## 2017-04-04 ENCOUNTER — Encounter (HOSPITAL_COMMUNITY): Payer: Self-pay | Admitting: Internal Medicine

## 2017-04-19 ENCOUNTER — Encounter: Payer: Self-pay | Admitting: Internal Medicine

## 2017-04-19 ENCOUNTER — Ambulatory Visit: Payer: 59 | Admitting: Internal Medicine

## 2017-04-19 VITALS — BP 152/86 | HR 75 | Ht 72.0 in | Wt 249.4 lb

## 2017-04-19 DIAGNOSIS — I10 Essential (primary) hypertension: Secondary | ICD-10-CM

## 2017-04-19 DIAGNOSIS — I481 Persistent atrial fibrillation: Secondary | ICD-10-CM

## 2017-04-19 DIAGNOSIS — I4819 Other persistent atrial fibrillation: Secondary | ICD-10-CM

## 2017-04-19 MED ORDER — HYDROCHLOROTHIAZIDE 25 MG PO TABS: 25.0000 mg | ORAL_TABLET | Freq: Every day | ORAL | 1 refills | Status: DC

## 2017-04-19 NOTE — Progress Notes (Signed)
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 male who presents today for routine electrophysiology followup.  I have not seen him in quite some time. He presented 03/30/17 with afib.  This was his first recurrence and occurred in the setting of new stress, travel, and ETOH.  He underwent cardioversion 04/02/17.  He has remained in sinus rhythm since that time. He has been diagnosed with Alpha gal allergy earlier this year after having difficulty with angioedema.  Today, he denies symptoms of palpitations, chest pain, shortness of breath,  lower extremity edema, dizziness, presyncope, or syncope.  The patient is otherwise without complaint today.   Past Medical History:  Diagnosis Date  . Hypertension   . Impaired fasting glucose   . Persistent atrial fibrillation ([place]) 07/10/2012   cardioversion x 2  . Pneumonia    8th grade, without recurrence  . Ureteral stone    Past Surgical History:  Procedure Laterality Date  . ATRIAL FIBRILLATION ABLATION  04/19/2014  . ATRIAL FIBRILLATION ABLATION N/A 04/19/2014   Procedure: ATRIAL FIBRILLATION ABLATION;  Surgeon: [name], MD;  Location: [facility] CATH LAB;  Service: Cardiovascular;  Laterality: N/A;  . CARDIOVERSION N/A 07/14/2012   Procedure: CARDIOVERSION;  Surgeon: [name], MD;  Location: [place];  Service: Cardiovascular;  Laterality: N/A;  . CARDIOVERSION N/A 09/16/2012   Procedure: CARDIOVERSION;  Surgeon: [name], MD;  Location: [facility] ENDOSCOPY;  Service: Cardiovascular;  Laterality: N/A;  . CARDIOVERSION N/A 03/14/2014   Procedure: CARDIOVERSION;  Surgeon: [name], MD;  Location: [facility] ENDOSCOPY;  Service: Cardiovascular;  Laterality: N/A;  . CARDIOVERSION N/A 04/02/2017   Procedure: CARDIOVERSION;  Surgeon: [name], MD;  Location: [place];  Service: Cardiovascular;  Laterality: N/A;  . COLONOSCOPY WITH PROPOFOL N/A 01/31/2013   Procedure: COLONOSCOPY WITH  PROPOFOL;  Surgeon: [name], MD;  Location: [facility] ENDOSCOPY;  Service: Endoscopy;  Laterality: N/A;  . FRACTURE SURGERY    . ORIF METACARPAL FRACTURE Right   . SHOULDER ARTHROSCOPY W/ ROTATOR CUFF REPAIR Bilateral   . TEE WITHOUT CARDIOVERSION N/A 07/14/2012   Procedure: TRANSESOPHAGEAL ECHOCARDIOGRAM (TEE);  Surgeon: [name], MD;  Location: [facility] ENDOSCOPY;  Service: Cardiovascular;  Laterality: N/A;  . TEE WITHOUT CARDIOVERSION N/A 09/16/2012   Procedure: TRANSESOPHAGEAL ECHOCARDIOGRAM (TEE);  Surgeon: [name], MD;  Location: [facility] ENDOSCOPY;  Service: Cardiovascular;  Laterality: N/A;  . TEE WITHOUT CARDIOVERSION N/A 04/18/2014   Procedure: TRANSESOPHAGEAL ECHOCARDIOGRAM (TEE);  Surgeon: [name], MD;  Location: [facility] ENDOSCOPY;  Service: Cardiovascular;  Laterality: N/A;  . TEE WITHOUT CARDIOVERSION N/A 04/02/2017   Procedure: TRANSESOPHAGEAL ECHOCARDIOGRAM (TEE);  Surgeon: [name], MD;  Location: [facility] ENDOSCOPY;  Service: Cardiovascular;  Laterality: N/A;    ROS- all systems are reviewed and negatives except as per HPI above  Current Outpatient Medications  Medication Sig Dispense Refill  . apixaban (ELIQUIS) 5 MG TABS tablet Take 1 tablet (5 mg total) by mouth 2 (two) times daily. 60 tablet 2  . Ascorbic Acid (VITAMIN C) 1000 MG tablet Take 1,000 mg by mouth daily. (0800)    . cetirizine (ZYRTEC) 10 MG tablet Take 10 mg by mouth daily. (0800)    . diltiazem (CARDIZEM CD) 180 MG 24 hr capsule Take 180 mg by mouth daily. (0800)    . fluticasone (FLONASE) 50 MCG/ACT nasal spray Place 1 spray into both nostrils daily as needed for allergies.    . Glucosamine HCl (  GLUCOSAMINE PO) Take 1,500 mg by mouth daily. (0800)    . Multiple Vitamin (MULTIVITAMIN WITH MINERALS) TABS tablet Take 1 tablet by mouth daily. (0800)     No current facility-administered medications for this visit.     Physical Exam: Vitals:   04/19/17 1632  BP: (!) 152/86  Pulse: 75  Weight: 249 lb 6.4 oz  (113.1 kg)  Height: 6' (1.829 m)    GEN- The patient is well appearing, alert and oriented x 3 today.   Head- normocephalic, atraumatic Eyes-  Sclera clear, conjunctiva pink Ears- hearing intact Oropharynx- clear Lungs- Clear to ausculation bilaterally, normal work of breathing Heart- Regular rate and rhythm, no murmurs, rubs or gallops, PMI not laterally displaced GI- soft, NT, ND, + BS Extremities- no clubbing, cyanosis, or edema  EKG tracing ordered today is personally reviewed and shows sinus rhythm, nonspecific ST/T changes  Assessment and Plan:  1. Persistent afib He has done very well s/p ablation He has had only a single recent recurrence At this time, I think the best approach is for watchful waiting.  If he has further episodes then we could reconsider ablation chad2vasc score is at least 3.  Continue on long term anticoagulation We discussed ARREST AF/ lifestyle modification at length today.  2. HTN Elevated today He was started on hctz 12.5mg  daily several weeks ago I will increase to 25mg  daily today. He is instructed to follow-up with PCP for BMET and further BP evaluation in 2 weeks  3. Snores I have offered sleep study.  He declines at this time but will consider this further.  [name] MD, [facility] 04/19/2017 4:38 PM

## 2017-04-19 NOTE — Patient Instructions (Addendum)
Medication Instructions:  Your physician has recommended you make the following change in your medication: 1. INCREASE Hydrochlorothiazide (HCTZ) to 25 mg once daily  * If you need a refill on your cardiac medications before your next appointment, please call your pharmacy. *  Labwork: None ordered  Testing/Procedures: None ordered  Follow-Up: Follow up with your primary doctor in a couple of weeks.  You will also need follow lab work at this appointment.  If there are any issues getting in with you primary doctor, please call us and let us know so that we may arrange follow up lab work.  Your physician recommends that you schedule a follow-up appointment in: 4 months with Dr. [name].  Thank you for choosing [facility]!!

## 2017-06-26 ENCOUNTER — Other Ambulatory Visit (HOSPITAL_COMMUNITY): Payer: Self-pay | Admitting: Nurse Practitioner

## 2017-06-28 NOTE — Telephone Encounter (Signed)
Pt saw Dr [name] 04/19/17. Weight 113.1Kg. SCr 03/30/17 was 0.66. Thus will refill Eliquis 5mg  BID.

## 2017-09-01 ENCOUNTER — Ambulatory Visit: Payer: 59 | Admitting: Internal Medicine

## 2017-09-01 ENCOUNTER — Encounter: Payer: Self-pay | Admitting: Internal Medicine

## 2017-09-01 VITALS — BP 140/90 | HR 69 | Ht 72.0 in | Wt 253.0 lb

## 2017-09-01 DIAGNOSIS — I10 Essential (primary) hypertension: Secondary | ICD-10-CM

## 2017-09-01 DIAGNOSIS — I481 Persistent atrial fibrillation: Secondary | ICD-10-CM

## 2017-09-01 DIAGNOSIS — I4819 Other persistent atrial fibrillation: Secondary | ICD-10-CM

## 2017-09-01 NOTE — Progress Notes (Signed)
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 male who presents today for routine electrophysiology followup.  Since last being seen in our clinic, the patient reports doing very well.  No further afib. Today, he denies symptoms of palpitations, chest pain, shortness of breath,  lower extremity edema, dizziness, presyncope, or syncope.  The patient is otherwise without complaint today.   Past Medical History:  Diagnosis Date  . Allergy to alpha-gal   . Hypertension   . Impaired fasting glucose   . Persistent atrial fibrillation ([place]) 07/10/2012   cardioversion x 2  . Pneumonia    8th grade, without recurrence  . Ureteral stone    Past Surgical History:  Procedure Laterality Date  . ATRIAL FIBRILLATION ABLATION  04/19/2014  . ATRIAL FIBRILLATION ABLATION N/A 04/19/2014   Procedure: ATRIAL FIBRILLATION ABLATION;  Surgeon: [name], MD;  Location: [facility] CATH LAB;  Service: Cardiovascular;  Laterality: N/A;  . CARDIOVERSION N/A 07/14/2012   Procedure: CARDIOVERSION;  Surgeon: [name], MD;  Location: [place];  Service: Cardiovascular;  Laterality: N/A;  . CARDIOVERSION N/A 09/16/2012   Procedure: CARDIOVERSION;  Surgeon: [name], MD;  Location: [facility] ENDOSCOPY;  Service: Cardiovascular;  Laterality: N/A;  . CARDIOVERSION N/A 03/14/2014   Procedure: CARDIOVERSION;  Surgeon: [name], MD;  Location: [facility] ENDOSCOPY;  Service: Cardiovascular;  Laterality: N/A;  . CARDIOVERSION N/A 04/02/2017   Procedure: CARDIOVERSION;  Surgeon: [name], MD;  Location: [place];  Service: Cardiovascular;  Laterality: N/A;  . COLONOSCOPY WITH PROPOFOL N/A 01/31/2013   Procedure: COLONOSCOPY WITH PROPOFOL;  Surgeon: [name], MD;  Location: [facility] ENDOSCOPY;  Service: Endoscopy;  Laterality: N/A;  . FRACTURE SURGERY    . ORIF METACARPAL FRACTURE Right   . SHOULDER ARTHROSCOPY W/ ROTATOR CUFF REPAIR Bilateral   . TEE WITHOUT  CARDIOVERSION N/A 07/14/2012   Procedure: TRANSESOPHAGEAL ECHOCARDIOGRAM (TEE);  Surgeon: [name], MD;  Location: [facility] ENDOSCOPY;  Service: Cardiovascular;  Laterality: N/A;  . TEE WITHOUT CARDIOVERSION N/A 09/16/2012   Procedure: TRANSESOPHAGEAL ECHOCARDIOGRAM (TEE);  Surgeon: [name], MD;  Location: [facility] ENDOSCOPY;  Service: Cardiovascular;  Laterality: N/A;  . TEE WITHOUT CARDIOVERSION N/A 04/18/2014   Procedure: TRANSESOPHAGEAL ECHOCARDIOGRAM (TEE);  Surgeon: [name], MD;  Location: [facility] ENDOSCOPY;  Service: Cardiovascular;  Laterality: N/A;  . TEE WITHOUT CARDIOVERSION N/A 04/02/2017   Procedure: TRANSESOPHAGEAL ECHOCARDIOGRAM (TEE);  Surgeon: [name], MD;  Location: [facility] ENDOSCOPY;  Service: Cardiovascular;  Laterality: N/A;    ROS- all systems are reviewed and negatives except as per HPI above  Current Outpatient Medications  Medication Sig Dispense Refill  . Ascorbic Acid (VITAMIN C) 1000 MG tablet Take 1,000 mg by mouth daily. (0800)    . cetirizine (ZYRTEC) 10 MG tablet Take 10 mg by mouth daily. (0800)    . DILT-XR 240 MG 24 hr capsule Take 240 mg by mouth daily.  3  . ELIQUIS 5 MG TABS tablet TAKE 1 TABLET BY MOUTH TWICE A DAY 60 tablet 9  . fluticasone (FLONASE) 50 MCG/ACT nasal spray Place 1 spray into both nostrils daily as needed for allergies.    . Glucosamine HCl (GLUCOSAMINE PO) Take 1,500 mg by mouth daily. (0800)    . hydrochlorothiazide (HYDRODIURIL) 25 MG tablet Take 1 tablet (25 mg total) by mouth daily. 90 tablet 1  . Multiple Vitamin (MULTIVITAMIN WITH MINERALS) TABS tablet Take 1 tablet by mouth daily. (0800)  No current facility-administered medications for this visit.     Physical Exam: Vitals:   09/01/17 0808  BP: 140/90  Pulse: 69  Weight: 253 lb (114.8 kg)  Height: 6' (1.829 m)    GEN- The patient is well appearing, alert and oriented x 3 today.   Head- normocephalic, atraumatic Eyes-  Sclera clear, conjunctiva pink Ears- hearing  intact Oropharynx- clear Lungs- Clear to ausculation bilaterally, normal work of breathing Heart- Regular rate and rhythm, no murmurs, rubs or gallops, PMI not laterally displaced GI- soft, NT, ND, + BS Extremities- no clubbing, cyanosis, or edema  Wt Readings from Last 3 Encounters:  09/01/17 253 lb (114.8 kg)  04/19/17 249 lb 6.4 oz (113.1 kg)  04/02/17 250 lb (113.4 kg)    EKG tracing ordered today is personally reviewed and shows sinus rhythm 69 bpm, PR 200 msec, Qtc 407 msec  Assessment and Plan:  1. Persistent atrial fibrillation Doing well at this time, with only a single recurrence requiring cardioversion back in December in the setting of travel, attending 2 weddings, ETOH, etc. chads2vasc score is 3.  Continue anticoagulation  2. HTN Stable No change required today Sodium restriction and weight loss advised  3. Snores Declines sleep study  Return in 6 months  [name] MD, [facility] 09/01/2017 8:19 AM

## 2017-09-01 NOTE — Patient Instructions (Addendum)

## 2017-09-04 ENCOUNTER — Telehealth: Payer: Self-pay | Admitting: Cardiology

## 2017-09-04 NOTE — Telephone Encounter (Signed)
Pt called with irregular HR -while he was playing golf.  He went home, drank fluids and took extra dilt.  240 mg he has not missed eliquis.  No pain, no SOB.  Feels fine.  Dr. Lovena Le was present when I took the call and we reassured pt to give the dilt more time.  If no symptoms just to take it easy, pt 's HR is 130.

## 2017-09-07 NOTE — Telephone Encounter (Signed)
Stacy,  Please follow-up with him.

## 2017-09-07 NOTE — Telephone Encounter (Signed)
I contacted pt to see how he was feeling since speaking to Dr. Bonita Quin office about going into afib on the golf course.  Pt states he is back in normal rhythm and has appt scheduled with Dr. [name] tomorrow afternoon.  Pt states that he took 2 of the 120 mg diltiazem.

## 2017-09-08 ENCOUNTER — Ambulatory Visit: Payer: 59 | Admitting: Internal Medicine

## 2017-09-08 ENCOUNTER — Encounter: Payer: Self-pay | Admitting: Internal Medicine

## 2017-09-08 VITALS — BP 124/82 | HR 72 | Ht 72.0 in | Wt 249.0 lb

## 2017-09-08 DIAGNOSIS — I481 Persistent atrial fibrillation: Secondary | ICD-10-CM

## 2017-09-08 DIAGNOSIS — I10 Essential (primary) hypertension: Secondary | ICD-10-CM | POA: Diagnosis not present

## 2017-09-08 DIAGNOSIS — I4819 Other persistent atrial fibrillation: Secondary | ICD-10-CM

## 2017-09-08 NOTE — Patient Instructions (Addendum)
Medication Instructions:  Your physician recommends that you continue on your current medications as directed. Please refer to the Current Medication list given to you today.   Labwork: None ordered  Testing/Procedures: None ordered  Follow-Up: Your physician recommends that you schedule a follow-up appointment in: 6 months with Dr. [name]   Any Other Special Instructions Will Be Listed Below (If Applicable).

## 2017-09-08 NOTE — Progress Notes (Signed)
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 male who presents today for routine electrophysiology followup.  Since last being seen in our clinic, the patient reports doing very well.  He had afib on Friday while playing golf, lasting until the following morning.  This was in the setting of URI, drinking 2 beers and playing in the heat.  He has had no further episodes since.  Today, he denies symptoms of palpitations, chest pain, shortness of breath,  lower extremity edema, dizziness, presyncope, or syncope.  The patient is otherwise without complaint today.   Past Medical History:  Diagnosis Date  . Allergy to alpha-gal   . Hypertension   . Impaired fasting glucose   . Persistent atrial fibrillation ([place]) 07/10/2012   cardioversion x 2  . Pneumonia    8th grade, without recurrence  . Ureteral stone    Past Surgical History:  Procedure Laterality Date  . ATRIAL FIBRILLATION ABLATION  04/19/2014  . ATRIAL FIBRILLATION ABLATION N/A 04/19/2014   Procedure: ATRIAL FIBRILLATION ABLATION;  Surgeon: [name], MD;  Location: [facility] CATH LAB;  Service: Cardiovascular;  Laterality: N/A;  . CARDIOVERSION N/A 07/14/2012   Procedure: CARDIOVERSION;  Surgeon: [name], MD;  Location: [place];  Service: Cardiovascular;  Laterality: N/A;  . CARDIOVERSION N/A 09/16/2012   Procedure: CARDIOVERSION;  Surgeon: [name], MD;  Location: [facility] ENDOSCOPY;  Service: Cardiovascular;  Laterality: N/A;  . CARDIOVERSION N/A 03/14/2014   Procedure: CARDIOVERSION;  Surgeon: [name], MD;  Location: [facility] ENDOSCOPY;  Service: Cardiovascular;  Laterality: N/A;  . CARDIOVERSION N/A 04/02/2017   Procedure: CARDIOVERSION;  Surgeon: [name], MD;  Location: [place];  Service: Cardiovascular;  Laterality: N/A;  . COLONOSCOPY WITH PROPOFOL N/A 01/31/2013   Procedure: COLONOSCOPY WITH PROPOFOL;  Surgeon: [name], MD;  Location: [facility]  ENDOSCOPY;  Service: Endoscopy;  Laterality: N/A;  . FRACTURE SURGERY    . ORIF METACARPAL FRACTURE Right   . SHOULDER ARTHROSCOPY W/ ROTATOR CUFF REPAIR Bilateral   . TEE WITHOUT CARDIOVERSION N/A 07/14/2012   Procedure: TRANSESOPHAGEAL ECHOCARDIOGRAM (TEE);  Surgeon: [name], MD;  Location: [facility] ENDOSCOPY;  Service: Cardiovascular;  Laterality: N/A;  . TEE WITHOUT CARDIOVERSION N/A 09/16/2012   Procedure: TRANSESOPHAGEAL ECHOCARDIOGRAM (TEE);  Surgeon: [name], MD;  Location: [facility] ENDOSCOPY;  Service: Cardiovascular;  Laterality: N/A;  . TEE WITHOUT CARDIOVERSION N/A 04/18/2014   Procedure: TRANSESOPHAGEAL ECHOCARDIOGRAM (TEE);  Surgeon: [name], MD;  Location: [facility] ENDOSCOPY;  Service: Cardiovascular;  Laterality: N/A;  . TEE WITHOUT CARDIOVERSION N/A 04/02/2017   Procedure: TRANSESOPHAGEAL ECHOCARDIOGRAM (TEE);  Surgeon: [name], MD;  Location: [facility] ENDOSCOPY;  Service: Cardiovascular;  Laterality: N/A;    ROS- all systems are reviewed and negatives except as per HPI above  Current Outpatient Medications  Medication Sig Dispense Refill  . Ascorbic Acid (VITAMIN C) 1000 MG tablet Take 1,000 mg by mouth daily. (0800)    . cetirizine (ZYRTEC) 10 MG tablet Take 10 mg by mouth daily. (0800)    . DILT-XR 240 MG 24 hr capsule Take 240 mg by mouth daily.  3  . ELIQUIS 5 MG TABS tablet TAKE 1 TABLET BY MOUTH TWICE A DAY 60 tablet 9  . fluticasone (FLONASE) 50 MCG/ACT nasal spray Place 1 spray into both nostrils daily as needed for allergies.    . Glucosamine HCl (GLUCOSAMINE PO) Take 1,500 mg by mouth daily. (0800)    .  hydrochlorothiazide (HYDRODIURIL) 25 MG tablet Take 1 tablet (25 mg total) by mouth daily. 90 tablet 1  . Multiple Vitamin (MULTIVITAMIN WITH MINERALS) TABS tablet Take 1 tablet by mouth daily. (0800)     No current facility-administered medications for this visit.     Physical Exam: Vitals:   09/08/17 1040  BP: 124/82  Pulse: 72  Weight: 249 lb (112.9 kg)    Height: 6' (1.829 m)    GEN- The patient is well appearing, alert and oriented x 3 today.   Head- normocephalic, atraumatic Eyes-  Sclera clear, conjunctiva pink Ears- hearing intact Oropharynx- clear Lungs- Clear to ausculation bilaterally, normal work of breathing Heart- Regular rate and rhythm, no murmurs, rubs or gallops, PMI not laterally displaced GI- soft, NT, ND, + BS Extremities- no clubbing, cyanosis, or edema  Wt Readings from Last 3 Encounters:  09/08/17 249 lb (112.9 kg)  09/01/17 253 lb (114.8 kg)  04/19/17 249 lb 6.4 oz (113.1 kg)    EKG tracing ordered today is personally reviewed and shows sinus rhythm, normal ekg  Assessment and Plan:  1. Persistent afib He has recently had afib but has done well since his ablation in 2016. chads2vasc score is 3.  He is on anticoagulation We discussed options of AAD and ablation today.  For now, he would prefer watchful waiting and lifestyle modification  2. HTN Stable No change required today  3. 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  He may consider speaking with Dr Louie Casa who he has seen previously as a patient.  Return in 6 months unless afib recurs   [name] MD, [facility] 09/08/2017 11:29 AM

## 2017-10-21 ENCOUNTER — Encounter: Payer: Self-pay | Admitting: Cardiology

## 2017-11-08 NOTE — Progress Notes (Signed)
Reginald [name]. [address], [address] [place], [place]  [address] Phone: [phone] Fax:  [phone]  Date:  11/09/2017   ID:  Reginald [name], DOB 27-Oct-1950, MRN [id number]  PCP:  [name], MD   History of Present Illness: Reginald [name] is a 67 y.o. male with history of persistent atrial fibrillation with ablation by Dr. [name] on 04/19/14.  In April 2015 underwent cardioversion after experiencing atrial fibrillation on of course. Atrial for ablation returned in June. Placed on Multaq at that time. Maintained rhythm until January 2016. Poor sleep may have been triggered.  Multaq 400 mg twice a day stopped. Main symptom is fatigue with atrial fibrillation. Eliquis, anticoagulation now stopped.  CXR - elevated hemidiaphragm.  06/06/14-After beach trip felt a few skipped beats.  This worried him. He may have felt some increased shortness of breath during that episode. Overall however, he is steadily improving with less and less shortness of breath. He still has some sensation of slightly worse breathing when laying on left versus right.  12/04/14-last office visit with Dr. [name] reviewed from 11/05/14-doing well. Score is 1. Stopped Eliquis per guidelines. Right elevated hemidiaphragm noted and although in his note it says to repeat chest x-ray at this time, I do not see the completion of this order. No atrial fibrillation detected on 24-hour Holter monitor in June 2016. Doing a good job with weight loss. Occasional PVCs, symptomatic.  His daughter got married at [place].  03/12/15 - overall feeling well. No significant evidence of atrial fibrillation. Breathing has improved. No bleeding. Traveling to [facility]. He has been to [place] as well.  No longer complaining of any symptoms of dyspnea. No bleeding. We discussed weight.  09/10/15-has gained approximate 10 pounds. Overall has been doing well. No syncope, no bleeding, no orthopnea, no PND. Rare palpitations felt. Fell once on of  course early in the morning.  11/09/2017- since her last visit he has had episodes of atrial relation, 1 of which required TEE cardioversion in December 2018 by Dr. Harrington Challenger.  He had an episode while playing golf, and the heat with upper respiratory infection, 2 beers. Memorial weekend. Leaving for [place] father son trip (4 of them together. HR was 120 on 6th hole. 10 th hole felt irreg but high. Took 120 diltiazem on golf course. After 1 hour when he was back took metoprolol. 12-24 hours later went back to normal rhythm.  Ellpitical, 130 stayed up for a few more minutes than usual. Worried about HCTZ dehydration.  Denies any syncope bleeding orthopnea PND.  If he has any shortness of breath with activity, will have low threshold for stress testing.  Back in 2015 exercise treadmill test was done and was normal.  Wt Readings from Last 3 Encounters:  11/09/17 242 lb 6.4 oz (110 kg)  09/08/17 249 lb (112.9 kg)  09/01/17 253 lb (114.8 kg)     Past Medical History:  Diagnosis Date  . Allergy to alpha-gal   . Hypertension   . Impaired fasting glucose   . Persistent atrial fibrillation ([place]) 07/10/2012   cardioversion x 2  . Pneumonia    8th grade, without recurrence  . Ureteral stone     Past Surgical History:  Procedure Laterality Date  . ATRIAL FIBRILLATION ABLATION  04/19/2014  . ATRIAL FIBRILLATION ABLATION N/A 04/19/2014   Procedure: ATRIAL FIBRILLATION ABLATION;  Surgeon: [name], MD;  Location: [facility] CATH LAB;  Service: Cardiovascular;  Laterality: N/A;  . CARDIOVERSION  N/A 07/14/2012   Procedure: CARDIOVERSION;  Surgeon: [name], MD;  Location: [place];  Service: Cardiovascular;  Laterality: N/A;  . CARDIOVERSION N/A 09/16/2012   Procedure: CARDIOVERSION;  Surgeon: [name], MD;  Location: [facility] ENDOSCOPY;  Service: Cardiovascular;  Laterality: N/A;  . CARDIOVERSION N/A 03/14/2014   Procedure: CARDIOVERSION;  Surgeon: [name], MD;  Location: [facility] ENDOSCOPY;  Service:  Cardiovascular;  Laterality: N/A;  . CARDIOVERSION N/A 04/02/2017   Procedure: CARDIOVERSION;  Surgeon: [name], MD;  Location: [place];  Service: Cardiovascular;  Laterality: N/A;  . COLONOSCOPY WITH PROPOFOL N/A 01/31/2013   Procedure: COLONOSCOPY WITH PROPOFOL;  Surgeon: [name], MD;  Location: [facility] ENDOSCOPY;  Service: Endoscopy;  Laterality: N/A;  . FRACTURE SURGERY    . ORIF METACARPAL FRACTURE Right   . SHOULDER ARTHROSCOPY W/ ROTATOR CUFF REPAIR Bilateral   . TEE WITHOUT CARDIOVERSION N/A 07/14/2012   Procedure: TRANSESOPHAGEAL ECHOCARDIOGRAM (TEE);  Surgeon: [name], MD;  Location: [facility] ENDOSCOPY;  Service: Cardiovascular;  Laterality: N/A;  . TEE WITHOUT CARDIOVERSION N/A 09/16/2012   Procedure: TRANSESOPHAGEAL ECHOCARDIOGRAM (TEE);  Surgeon: [name], MD;  Location: [facility] ENDOSCOPY;  Service: Cardiovascular;  Laterality: N/A;  . TEE WITHOUT CARDIOVERSION N/A 04/18/2014   Procedure: TRANSESOPHAGEAL ECHOCARDIOGRAM (TEE);  Surgeon: [name], MD;  Location: [facility] ENDOSCOPY;  Service: Cardiovascular;  Laterality: N/A;  . TEE WITHOUT CARDIOVERSION N/A 04/02/2017   Procedure: TRANSESOPHAGEAL ECHOCARDIOGRAM (TEE);  Surgeon: [name], MD;  Location: [facility] ENDOSCOPY;  Service: Cardiovascular;  Laterality: N/A;    Current Outpatient Medications  Medication Sig Dispense Refill  . Ascorbic Acid (VITAMIN C) 1000 MG tablet Take 1,000 mg by mouth daily. (0800)    . cetirizine (ZYRTEC) 10 MG tablet Take 10 mg by mouth daily. (0800)    . DILT-XR 240 MG 24 hr capsule Take 240 mg by mouth daily.  3  . ELIQUIS 5 MG TABS tablet TAKE 1 TABLET BY MOUTH TWICE A DAY 60 tablet 9  . fluticasone (FLONASE) 50 MCG/ACT nasal spray Place 1 spray into both nostrils daily as needed for allergies.    . Glucosamine HCl (GLUCOSAMINE PO) Take 1,500 mg by mouth daily. (0800)    . Multiple Vitamin (MULTIVITAMIN WITH MINERALS) TABS tablet Take 1 tablet by mouth daily. (0800)    . amLODipine (NORVASC)  5 MG tablet Take 1 tablet (5 mg total) by mouth daily. 90 tablet 3  . diltiazem (CARDIZEM CD) 120 MG 24 hr capsule Take 1 capsule (120 mg total) by mouth 2 (two) times daily as needed. 60 capsule 2   No current facility-administered medications for this visit.     Allergies:    Allergies  Allergen Reactions  . Losartan Swelling    Angioedema  . 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 Lisinopril Swelling    angioedema    Social History:  The patient  reports that he has never smoked. He has never used smokeless tobacco. He reports that he drinks about 8.4 oz of alcohol per week. He reports that he does not use drugs.   Family History  Problem Relation Age of Onset  . Hypertension Mother     ROS:  Please see the history of present illness.   Denies any fevers, chills, orthopnea, PND, no bleeding   All other systems reviewed and negative.   PHYSICAL EXAM: VS:  BP 138/86   Pulse 71   Ht 6' (1.829 m)   Wt 242 lb 6.4  oz (110 kg)   SpO2 95%   BMI 32.88 kg/m  GEN: Well nourished, well developed, in no acute distress, mildly overweight HEENT: normal  Neck: no JVD, carotid bruits, or masses Cardiac: RRR; no murmurs, rubs, or gallops,no edema  Respiratory:  clear to auscultation bilaterally, normal work of breathing GI: soft, nontender, nondistended, + BS MS: no deformity or atrophy  Skin: warm and dry, no rash Neuro:  Alert and Oriented x 3, Strength and sensation are intact Psych: euthymic mood, full affect   EKG:   04/20/14-sinus rhythm, 68, no other abnormalities    Exercise treadmill test 2015-normal.  Echocardiogram, transesophageal with DC cardioversion- 04/02/2017- normal EF, no left atrial appendage thrombus-successful cardioversion 04/18/14-normal ejection fraction, septal aneurysm.  Chest x-ray: 06/18/14: Elevated right hemidiaphragm.  ASSESSMENT AND PLAN:  1. Paroxysmal atrial fibrillation-ablation by Dr. [name] 04/19/14.  Was doing well for approximately 3  years then had another episode of atrial fibrillation requiring TEE cardioversion on 04/02/2017, Dr. Harrington Challenger performed.  Rare episodes.  One lasted while playing golf for approximately 24 hours in the setting of upper respiratory infection, 2 beers in the heat.  Doing well. Sinus rhythm. Eliquis previously stopped per guidelines. CHADS-VASc 2 now.  No longer on Multaq.  Rare palpitations. PVCs. They seem to accompany lack of sleep.  In review of Dr. 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  On diltiazem.  Stopping HCTZ as below.  Question of dehydration is precipitating some of these episodes.  Amlodipine starting in its place.  Avoiding ACE inhibitors because of angioedema.  Trip to [place].  Prior potassium excellent 4.1, creatinine 0.76.  If atrial fibrillation becomes more of a frequent thing, I agree with Dr. [name] that we should discuss potential ablation repeat or antiarrhythmic.  He states that he would consider ablation may be in the wintertime if necessary. 2. Essential hypertension-stopping HCTZ as this may lead to some dehydration which may be precipitating some atrial fibrillation episodes.  I will start amlodipine 5 mg once a day.  I am aware that he is also on diltiazem but these are 2 different mechanisms of action.  Avoiding ACE inhibitors and angiotensin receptor blockers because of prior angioedema.  Placing an allergies.  3. 6 month follow up.  Signed, [name], MD [facility]  11/09/2017 9:08 AM

## 2017-11-09 ENCOUNTER — Encounter: Payer: Self-pay | Admitting: Cardiology

## 2017-11-09 ENCOUNTER — Ambulatory Visit: Payer: 59 | Admitting: Cardiology

## 2017-11-09 VITALS — BP 138/86 | HR 71 | Ht 72.0 in | Wt 242.4 lb

## 2017-11-09 DIAGNOSIS — I480 Paroxysmal atrial fibrillation: Secondary | ICD-10-CM | POA: Diagnosis not present

## 2017-11-09 DIAGNOSIS — Z79899 Other long term (current) drug therapy: Secondary | ICD-10-CM | POA: Diagnosis not present

## 2017-11-09 DIAGNOSIS — I10 Essential (primary) hypertension: Secondary | ICD-10-CM

## 2017-11-09 MED ORDER — DILTIAZEM HCL ER COATED BEADS 120 MG PO CP24: 120.0000 mg | ORAL_CAPSULE | Freq: Two times a day (BID) | ORAL | 2 refills | Status: DC | PRN

## 2017-11-09 MED ORDER — AMLODIPINE BESYLATE 5 MG PO TABS: 5.0000 mg | ORAL_TABLET | Freq: Every day | ORAL | 3 refills | Status: DC

## 2017-11-09 NOTE — Patient Instructions (Signed)
Medication Instructions:  Please discontinue your HCTZ. Start Amlodipine 5 mg a day. Continue all other medications as listed.  Follow-Up: Follow up in 6 months with Dr. Marlou Porch.  You will receive a letter in the mail 2 months before you are due.  Please call us when you receive this letter to schedule your follow up appointment.  If you need a refill on your cardiac medications before your next appointment, please call your pharmacy.  Thank you for choosing [facility]!!

## 2017-12-15 ENCOUNTER — Telehealth: Payer: Self-pay | Admitting: *Deleted

## 2017-12-15 NOTE — Telephone Encounter (Signed)
   [facility] Pre-operative Risk Assessment    Request for surgical clearance:  1. What type of surgery is being performed? colonoscopy for history of adenomatous polyp  2. When is this surgery scheduled? 01/17/18    3. What type of clearance is required (medical clearance vs. Pharmacy clearance to hold med vs. Both)? BOTH  4. Are there any medications that need to be held prior to surgery and how long? Eliquis-can pt temporarily stop eliquis before procedure, or after procedure if polyps are removed?   5. Practice name and name of physician performing surgery? [facility], Dr. Otis Brace  6. What is your office phone number [phone]    7.   What is your office fax number [phone]  8.   Anesthesia type (None, local, MAC, general) ? propofol   Reginald [name] 12/15/2017, 10:57 AM  _________________________________________________________________   (provider comments below)

## 2017-12-15 NOTE — Telephone Encounter (Signed)
   Primary Cardiologist:Mark Marlou Porch, MD  Chart reviewed as part of pre-operative protocol coverage. Recently noted elevated BP at PCP office however he was in rush. Has has blood pressure cuff at home but says "don't trust reading (unable to give me number). He likes to have provider visit for blood pressure check". Please schedule visit with APP for BP check (must bring home BP machine) and pre-op clearance.    Pre-op covering staff: - Please schedule appointment.  - Please contact requesting surgeon's office via preferred method (i.e, phone, fax) to inform them of need for appointment prior to surgery.  [place], [place]  12/15/2017, 3:48 PM

## 2017-12-16 NOTE — Telephone Encounter (Signed)
Spoke with pt and he will come in for o/v tomorrow, 12/17/17, to see Cecilie Kicks, NP. Pt thanked me for the call.  Will route to the requesting surgeons office to make them aware.

## 2017-12-17 ENCOUNTER — Encounter: Payer: Self-pay | Admitting: Cardiology

## 2017-12-17 ENCOUNTER — Ambulatory Visit: Payer: 59 | Admitting: Cardiology

## 2017-12-17 VITALS — BP 132/76 | HR 72 | Ht 72.0 in | Wt 242.4 lb

## 2017-12-17 DIAGNOSIS — Z01818 Encounter for other preprocedural examination: Secondary | ICD-10-CM

## 2017-12-17 DIAGNOSIS — I480 Paroxysmal atrial fibrillation: Secondary | ICD-10-CM | POA: Diagnosis not present

## 2017-12-17 DIAGNOSIS — I10 Essential (primary) hypertension: Secondary | ICD-10-CM

## 2017-12-17 NOTE — Patient Instructions (Addendum)
Medication Instructions:  Your physician recommends that you continue on your current medications as directed. Please refer to the Current Medication list given to you today.  Please hold eliquis 24 hours prior to your colonoscopy.  Lab work: NONE  Testing/Procedures: NONE  Follow-Up: Your physician wants to keep your follow up appointment with Dr. [name].  If you need a refill on your cardiac medications before your next appointment, please call your pharmacy.

## 2017-12-17 NOTE — Progress Notes (Signed)
12/17/2017 Reginald [name]   1950/11/16  [id number]  Primary Physician [name], MD Primary Cardiologist: Dr. Marlou Porch Electrophysiologist: Dr. [name].   Reason for Visit/CC: Preoperative Evaluation Prior to Colonoscopy, F/u for HTN  HPI:  Reginald [name] is a 67 y.o. male who is being seen today preoperative evaluation as well as for f/u for HTN.   He is followed by Dr. Marlou Porch and also see's Dr. [name] for atrial fibrillation. He is on Eliquis for a/c with CHA2DS2 VASc score of 2. In addition to atrial fibrillation, he has a h/o HTN. He was previously on HCTZ but was having issues with dehydration, thus Dr. Marlou Porch recently switched him to amlodipine.   He is scheduled to get a colonoscopy in October. Clearance was sent to our office. Pt was called by APP in [facility] to see if any symptoms. Pt had endorsed recent episode of high BP while doing pre colonoscopy exam. Pt noted that he had gone to the wrong office for his appt and had to rush over to the other location. He was subsequently late and stressed. When they checked his BP it was high in the upper 494W systolic. Pt advised to come in for clinic assessment prior to clearance.   His BP today is controlled at 132/76. EKG shows NSR. HR well controlled at 72 bpm. He denies any exertional CP or dyspnea. He plays golf frequently w/o any limitations. He just returned from a golf trip in [place] and had no issues.   Current Meds  Medication Sig  . amLODipine (NORVASC) 5 MG tablet Take 1 tablet (5 mg total) by mouth daily.  . Ascorbic Acid (VITAMIN C) 1000 MG tablet Take 1,000 mg by mouth daily. (0800)  . cetirizine (ZYRTEC) 10 MG tablet Take 10 mg by mouth daily. (0800)  . DILT-XR 240 MG 24 hr capsule Take 240 mg by mouth daily.  Marland Kitchen diltiazem (CARDIZEM CD) 120 MG 24 hr capsule Take 1 capsule (120 mg total) by mouth 2 (two) times daily as needed.  Marland Kitchen ELIQUIS 5 MG TABS tablet TAKE 1 TABLET BY MOUTH TWICE A DAY  . fluticasone  (FLONASE) 50 MCG/ACT nasal spray Place 1 spray into both nostrils daily as needed for allergies.  . Glucosamine HCl (GLUCOSAMINE PO) Take 1,500 mg by mouth daily. (0800)  . Multiple Vitamin (MULTIVITAMIN WITH MINERALS) TABS tablet Take 1 tablet by mouth daily. (0800)   Allergies  Allergen Reactions  . Losartan Swelling    Angioedema  . 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 Lisinopril Swelling    angioedema   Past Medical History:  Diagnosis Date  . Allergy to alpha-gal   . Hypertension   . Impaired fasting glucose   . Persistent atrial fibrillation ([place]) 07/10/2012   cardioversion x 2  . Pneumonia    8th grade, without recurrence  . Ureteral stone    Family History  Problem Relation Age of Onset  . Hypertension Mother    Past Surgical History:  Procedure Laterality Date  . ATRIAL FIBRILLATION ABLATION  04/19/2014  . ATRIAL FIBRILLATION ABLATION N/A 04/19/2014   Procedure: ATRIAL FIBRILLATION ABLATION;  Surgeon: [name], MD;  Location: [facility] CATH LAB;  Service: Cardiovascular;  Laterality: N/A;  . CARDIOVERSION N/A 07/14/2012   Procedure: CARDIOVERSION;  Surgeon: [name], MD;  Location: [facility] ENDOSCOPY;  Service: Cardiovascular;  Laterality: N/A;  . CARDIOVERSION N/A 09/16/2012   Procedure: CARDIOVERSION;  Surgeon: [name], MD;  Location: [place];  Service: Cardiovascular;  Laterality: N/A;  . CARDIOVERSION N/A 03/14/2014   Procedure: CARDIOVERSION;  Surgeon: [name], MD;  Location: [facility] ENDOSCOPY;  Service: Cardiovascular;  Laterality: N/A;  . CARDIOVERSION N/A 04/02/2017   Procedure: CARDIOVERSION;  Surgeon: [name], MD;  Location: [place];  Service: Cardiovascular;  Laterality: N/A;  . COLONOSCOPY WITH PROPOFOL N/A 01/31/2013   Procedure: COLONOSCOPY WITH PROPOFOL;  Surgeon: [name], MD;  Location: [facility] ENDOSCOPY;  Service: Endoscopy;  Laterality: N/A;  . FRACTURE SURGERY    . ORIF METACARPAL FRACTURE Right   . SHOULDER ARTHROSCOPY W/  ROTATOR CUFF REPAIR Bilateral   . TEE WITHOUT CARDIOVERSION N/A 07/14/2012   Procedure: TRANSESOPHAGEAL ECHOCARDIOGRAM (TEE);  Surgeon: [name], MD;  Location: [facility] ENDOSCOPY;  Service: Cardiovascular;  Laterality: N/A;  . TEE WITHOUT CARDIOVERSION N/A 09/16/2012   Procedure: TRANSESOPHAGEAL ECHOCARDIOGRAM (TEE);  Surgeon: [name], MD;  Location: [facility] ENDOSCOPY;  Service: Cardiovascular;  Laterality: N/A;  . TEE WITHOUT CARDIOVERSION N/A 04/18/2014   Procedure: TRANSESOPHAGEAL ECHOCARDIOGRAM (TEE);  Surgeon: [name], MD;  Location: [facility] ENDOSCOPY;  Service: Cardiovascular;  Laterality: N/A;  . TEE WITHOUT CARDIOVERSION N/A 04/02/2017   Procedure: TRANSESOPHAGEAL ECHOCARDIOGRAM (TEE);  Surgeon: [name], MD;  Location: [facility] ENDOSCOPY;  Service: Cardiovascular;  Laterality: N/A;   Social History   Socioeconomic History  . Marital status: Married    Spouse name: Not on file  . Number of children: Not on file  . Years of education: Not on file  . Highest education level: Not on file  Occupational History  . Not on file  Social Needs  . Financial resource strain: Not on file  . Food insecurity:    Worry: Not on file    Inability: Not on file  . Transportation needs:    Medical: Not on file    Non-medical: Not on file  Tobacco Use  . Smoking status: Never Smoker  . Smokeless tobacco: Never Used  Substance and Sexual Activity  . Alcohol use: Yes    Alcohol/week: 14.0 standard drinks    Types: 7 Glasses of wine, 7 Shots of liquor per week    Comment: 04/19/2014 "2-3 glasses of wine or liquor per day"  . Drug use: No  . Sexual activity: Not Currently  Lifestyle  . Physical activity:    Days per week: Not on file    Minutes per session: Not on file  . Stress: Not on file  Relationships  . Social connections:    Talks on phone: Not on file    Gets together: Not on file    Attends religious service: Not on file    Active member of club or organization: Not on file    Attends  meetings of clubs or organizations: Not on file    Relationship status: Not on file  . Intimate partner violence:    Fear of current or ex partner: Not on file    Emotionally abused: Not on file    Physically abused: Not on file    Forced sexual activity: Not on file  Other Topics Concern  . Not on file  Social History Narrative   Lives in [place] with spouse.  2 children (ages 33 and 48).   Owns [employer]     Review of Systems: General: negative for chills, fever, night sweats or weight changes.  Cardiovascular: negative for chest pain, dyspnea on exertion, edema, orthopnea, palpitations, paroxysmal nocturnal dyspnea or shortness of breath Dermatological: negative  for rash Respiratory: negative for cough or wheezing Urologic: negative for hematuria Abdominal: negative for nausea, vomiting, diarrhea, bright red blood per rectum, melena, or hematemesis Neurologic: negative for visual changes, syncope, or dizziness All other systems reviewed and are otherwise negative except as noted above.   Physical Exam:  Blood pressure 132/76, pulse 72, height 6' (1.829 m), weight 242 lb 6.4 oz (110 kg).  General appearance: alert, cooperative and no distress Neck: no carotid bruit and no JVD Lungs: clear to auscultation bilaterally Heart: regular rate and rhythm, S1, S2 normal, no murmur, click, rub or gallop Extremities: extremities normal, atraumatic, no cyanosis or edema Pulses: 2+ and symmetric Skin: Skin color, texture, turgor normal. No rashes or lesions Neurologic: Grossly normal  EKG NSR72 bpm -- personally reviewed   ASSESSMENT AND PLAN:   1. H/o Atrial Fibrillation: NSR on EKG. Rate is controlled. On Eliquis for a/c.   2. HTN: improved and well controlled today at 132/76. No changes made.   3. Pre-op: pt cleared to undergo colonoscopy. Low risk. I discussed anticoagulation w/ Pharmacy. Pre office protocol, they recommend holding Eliquis 24 hrs prior to  colonoscopy.    Follow-Up: keep f/u with Dr. [name] in Nov. F/u with Dr. Marlou Porch in 6 months.   Brittainy Ladoris Gene, [facility] 12/17/2017 2:22 PM

## 2017-12-19 ENCOUNTER — Telehealth: Payer: Self-pay | Admitting: Cardiology

## 2017-12-19 NOTE — Telephone Encounter (Signed)
Pt called - he went into a fib last night, he has taken extra dilt.  He would like to be seen ASAP to arrange DCCV if needed - he needs to go to [place] at end of week, and would like this improved by then.  Will send to Dr. [name], [name] and Dr. Marlou Porch.   Pt is also interested in ablation.

## 2017-12-20 ENCOUNTER — Other Ambulatory Visit (HOSPITAL_COMMUNITY): Payer: Self-pay | Admitting: *Deleted

## 2017-12-20 ENCOUNTER — Telehealth: Payer: Self-pay | Admitting: Cardiology

## 2017-12-20 NOTE — Telephone Encounter (Signed)
Talked with patient - no outpatient cardioversions available before 9/18 - offered ER for cardioversion as no missed doses of eliquis in the last 3 weeks but patient would prefer not to do this. He will be going to [place] until next week - HR is controlled 70-100 with increasing diltiazem to 120mg  in the PM along with his 240mg  in the AM. He feels ok other than "knowing he is in AF"  Will plan for office visit before leaving to ensure rate control with scheduled cardioversion upon return from [place]. Pt in agreement with plan.

## 2017-12-20 NOTE — Telephone Encounter (Signed)
New Message    Patient is calling because he went into afib over the weekend. He states that he spoke with Cecilie Kicks on the after hours line and was told that if we do not call him by 11 to call. He is wanting an appt before the 11th if possible. Not showing that we have anything available. The staff message states "Pt back in a fib and would like to be seen by [name] or Dr. [name] tomorrow 12/20/17 I have sent them and Dr. Marlou Porch a message. Could see APP with general cardiology as well. Thanks. Mickel Baas".   Please call to discuss.

## 2017-12-20 NOTE — Telephone Encounter (Signed)
Lm to discuss

## 2017-12-21 ENCOUNTER — Encounter (HOSPITAL_COMMUNITY): Payer: Self-pay | Admitting: *Deleted

## 2017-12-21 ENCOUNTER — Ambulatory Visit (HOSPITAL_COMMUNITY): Payer: 59 | Admitting: Anesthesiology

## 2017-12-21 ENCOUNTER — Encounter (HOSPITAL_COMMUNITY)
Admission: RE | Disposition: A | Discharge status: Home / Self Care | Payer: Self-pay | Source: Ambulatory Visit | Attending: Internal Medicine

## 2017-12-21 ENCOUNTER — Ambulatory Visit (HOSPITAL_COMMUNITY)
Admission: RE | Admit: 2017-12-21 | Discharge: 2017-12-21 | Disposition: A | Discharge status: Home / Self Care | Payer: 59 | Source: Ambulatory Visit | Attending: Internal Medicine | Admitting: Internal Medicine

## 2017-12-21 DIAGNOSIS — I4891 Unspecified atrial fibrillation: Secondary | ICD-10-CM | POA: Diagnosis not present

## 2017-12-21 DIAGNOSIS — R7301 Impaired fasting glucose: Secondary | ICD-10-CM | POA: Diagnosis not present

## 2017-12-21 DIAGNOSIS — Z7951 Long term (current) use of inhaled steroids: Secondary | ICD-10-CM | POA: Diagnosis not present

## 2017-12-21 DIAGNOSIS — I10 Essential (primary) hypertension: Secondary | ICD-10-CM | POA: Diagnosis not present

## 2017-12-21 DIAGNOSIS — I481 Persistent atrial fibrillation: Secondary | ICD-10-CM | POA: Insufficient documentation

## 2017-12-21 DIAGNOSIS — Z7901 Long term (current) use of anticoagulants: Secondary | ICD-10-CM | POA: Insufficient documentation

## 2017-12-21 HISTORY — PX: CARDIOVERSION: SHX1299

## 2017-12-21 LAB — CBC
HCT: 52.9 % — ABNORMAL HIGH (ref 39.0–52.0)
Hemoglobin: 17.5 g/dL — ABNORMAL HIGH (ref 13.0–17.0)
MCH: 29.3 pg (ref 26.0–34.0)
MCHC: 33.1 g/dL (ref 30.0–36.0)
MCV: 88.6 fL (ref 78.0–100.0)
Platelets: 205 10*3/uL (ref 150–400)
RBC: 5.97 MIL/uL — ABNORMAL HIGH (ref 4.22–5.81)
RDW: 13.2 % (ref 11.5–15.5)
WBC: 6.7 10*3/uL (ref 4.0–10.5)

## 2017-12-21 LAB — BASIC METABOLIC PANEL
Anion gap: 8 (ref 5–15)
BUN: 13 mg/dL (ref 8–23)
CO2: 26 mmol/L (ref 22–32)
CREATININE: 0.76 mg/dL (ref 0.61–1.24)
Calcium: 9.2 mg/dL (ref 8.9–10.3)
Chloride: 107 mmol/L (ref 98–111)
GFR calc Af Amer: >60 mL/min (ref >60)
GFR calc non Af Amer: >60 mL/min (ref >60)
GLUCOSE: 114 mg/dL — AB (ref 70–99)
Potassium: 3.9 mmol/L (ref 3.5–5.1)
Sodium: 141 mmol/L (ref 135–145)

## 2017-12-21 SURGERY — CARDIOVERSION
Anesthesia: General

## 2017-12-21 MED ORDER — SODIUM CHLORIDE 0.9 % IV SOLN: 250.0000 mL | INTRAVENOUS | Status: DC

## 2017-12-21 MED ORDER — SODIUM CHLORIDE 0.9% FLUSH: 3.0000 mL | Freq: Two times a day (BID) | INTRAVENOUS | Status: DC

## 2017-12-21 MED ORDER — PROPOFOL 10 MG/ML IV BOLUS
INTRAVENOUS | Status: DC | PRN
  Administered 2017-12-21: 100 mg via INTRAVENOUS

## 2017-12-21 MED ORDER — SODIUM CHLORIDE 0.9 % IV SOLN
INTRAVENOUS | Status: DC
  Administered 2017-12-21: 10:00:00 via INTRAVENOUS

## 2017-12-21 MED ORDER — LIDOCAINE HCL (CARDIAC) PF 100 MG/5ML IV SOSY
PREFILLED_SYRINGE | INTRAVENOUS | Status: DC | PRN
  Administered 2017-12-21: 100 mg via INTRAVENOUS

## 2017-12-21 MED ORDER — SODIUM CHLORIDE 0.9% FLUSH: 3.0000 mL | INTRAVENOUS | Status: DC | PRN

## 2017-12-21 NOTE — Anesthesia Postprocedure Evaluation (Signed)
Anesthesia Post Note  Patient: [name]  Procedure(s) Performed: CARDIOVERSION (N/A )     Patient location during evaluation: PACU Anesthesia Type: General Level of consciousness: awake and alert Pain management: pain level controlled Vital Signs Assessment: post-procedure vital signs reviewed and stable Respiratory status: spontaneous breathing, nonlabored ventilation, respiratory function stable and patient connected to nasal cannula oxygen Cardiovascular status: blood pressure returned to baseline and stable Postop Assessment: no apparent nausea or vomiting Anesthetic complications: no    Last Vitals:  Vitals:   12/21/17 1105 12/21/17 1115  BP: 108/64 94/66  Pulse: 64   Resp: 16 13  Temp:    SpO2: 96% 98%    Last Pain:  Vitals:   12/21/17 1115  TempSrc:   PainSc: 0-No pain                 Lidia Collum

## 2017-12-21 NOTE — Discharge Instructions (Signed)
Electrical Cardioversion, Care After °This sheet gives you information about how to care for yourself after your procedure. Your health care provider may also give you more specific instructions. If you have problems or questions, contact your health care provider. °What can I expect after the procedure? °After the procedure, it is common to have: °· Some redness on the skin where the shocks were given. ° °Follow these instructions at home: °· Do not drive for 24 hours if you were given a medicine to help you relax (sedative). °· Take over-the-counter and prescription medicines only as told by your health care provider. °· Ask your health care provider how to check your pulse. Check it often. °· Rest for 48 hours after the procedure or as told by your health care provider. °· Avoid or limit your caffeine use as told by your health care provider. °Contact a health care provider if: °· You feel like your heart is beating too quickly or your pulse is not regular. °· You have a serious muscle cramp that does not go away. °Get help right away if: °· You have discomfort in your chest. °· You are dizzy or you feel faint. °· You have trouble breathing or you are short of breath. °· Your speech is slurred. °· You have trouble moving an arm or leg on one side of your body. °· Your fingers or toes turn cold or blue. °This information is not intended to replace advice given to you by your health care provider. Make sure you discuss any questions you have with your health care provider. °Document Released: 01/18/2013 Document Revised: 11/01/2015 Document Reviewed: 10/04/2015 °Elsevier Interactive Patient Education © 2018 Elsevier Inc. ° °

## 2017-12-21 NOTE — Anesthesia Preprocedure Evaluation (Signed)
Anesthesia Evaluation  Patient identified by MRN, date of birth, ID band Patient awake    Reviewed: Allergy & Precautions, NPO status , Patient's Chart, lab work & pertinent test results  History of Anesthesia Complications Negative for: history of anesthetic complications  Airway Mallampati: II  TM Distance: >3 FB Neck ROM: Full    Dental no notable dental hx.    Pulmonary neg pulmonary ROS,    Pulmonary exam normal        Cardiovascular hypertension, + dysrhythmias Atrial Fibrillation  Rhythm:Irregular Rate:Normal     Neuro/Psych negative neurological ROS     GI/Hepatic negative GI ROS, Neg liver ROS,   Endo/Other  negative endocrine ROS  Renal/GU negative Renal ROS     Musculoskeletal negative musculoskeletal ROS (+)   Abdominal   Peds  Hematology negative hematology ROS (+)   Anesthesia Other Findings   Reproductive/Obstetrics                             Anesthesia Physical Anesthesia Plan  ASA: III  Anesthesia Plan: General   Post-op Pain Management:    Induction: Intravenous  PONV Risk Score and Plan: 2 and TIVA  Airway Management Planned: Nasal Cannula  Additional Equipment:   Intra-op Plan:   Post-operative Plan:   Informed Consent: I have reviewed the patients History and Physical, chart, labs and discussed the procedure including the risks, benefits and alternatives for the proposed anesthesia with the patient or authorized representative who has indicated his/her understanding and acceptance.     Plan Discussed with:   Anesthesia Plan Comments:         Anesthesia Quick Evaluation

## 2017-12-21 NOTE — Anesthesia Procedure Notes (Signed)
Procedure Name: MAC Date/Time: 12/21/2017 10:57 AM Performed by: Teressa Lower., CRNA Pre-anesthesia Checklist: Patient identified, Emergency Drugs available, Suction available, Patient being monitored and Timeout performed Patient Re-evaluated:Patient Re-evaluated prior to induction Oxygen Delivery Method: Nasal cannula

## 2017-12-21 NOTE — Transfer of Care (Signed)
Immediate Anesthesia Transfer of Care Note  Patient: Reginald [name]  Procedure(s) Performed: CARDIOVERSION (N/A )  Patient Location: Endoscopy Unit  Anesthesia Type:General  Level of Consciousness: awake, alert  and oriented  Airway & Oxygen Therapy: Patient Spontanous Breathing and Patient connected to nasal cannula oxygen  Post-op Assessment: Report given to RN and Post -op Vital signs reviewed and stable  Post vital signs: Reviewed and stable  Last Vitals:  Vitals Value Taken Time  BP    Temp    Pulse 58 12/21/2017 10:58 AM  Resp 16 12/21/2017 10:58 AM  SpO2 99 % 12/21/2017 10:58 AM  Vitals shown include unvalidated device data.  Last Pain:  Vitals:   12/21/17 0929  TempSrc: Oral  PainSc: 0-No pain         Complications: No apparent anesthesia complications

## 2017-12-21 NOTE — H&P (Signed)
12/17/2017 Reginald [name]   23-Nov-1950  [id number]  Primary Physician [name], MD Primary Cardiologist: Dr. Marlou Porch Electrophysiologist: Dr. [name].   Reason for Visit/CC: Preoperative Evaluation Prior to Colonoscopy, F/u for HTN  HPI:  Reginald [name] is a 68 y.o. male who is being seen today preoperative evaluation as well as for f/u for HTN.   He is followed by Dr. Marlou Porch and also see's Dr. [name] for atrial fibrillation. He is on Eliquis for a/c with CHA2DS2 VASc score of 2. In addition to atrial fibrillation, he has a h/o HTN. He was previously on HCTZ but was having issues with dehydration, thus Dr. Marlou Porch recently switched him to amlodipine.   He is scheduled to get a colonoscopy in October. Clearance was sent to our office. Pt was called by APP in [facility] to see if any symptoms. Pt had endorsed recent episode of high BP while doing pre colonoscopy exam. Pt noted that he had gone to the wrong office for his appt and had to rush over to the other location. He was subsequently late and stressed. When they checked his BP it was high in the upper 185U systolic. Pt advised to come in for clinic assessment prior to clearance.   His BP today is controlled at 132/76. EKG shows NSR. HR well controlled at 72 bpm. He denies any exertional CP or dyspnea. He plays golf frequently w/o any limitations. He just returned from a golf trip in [place] and had no issues.   ActiveMedications      Current Meds  Medication Sig  . amLODipine (NORVASC) 5 MG tablet Take 1 tablet (5 mg total) by mouth daily.  . Ascorbic Acid (VITAMIN C) 1000 MG tablet Take 1,000 mg by mouth daily. (0800)  . cetirizine (ZYRTEC) 10 MG tablet Take 10 mg by mouth daily. (0800)  . DILT-XR 240 MG 24 hr capsule Take 240 mg by mouth daily.  Marland Kitchen diltiazem (CARDIZEM CD) 120 MG 24 hr capsule Take 1 capsule (120 mg total) by mouth 2 (two) times daily as needed.  Marland Kitchen ELIQUIS 5 MG TABS tablet TAKE 1 TABLET BY MOUTH  TWICE A DAY  . fluticasone (FLONASE) 50 MCG/ACT nasal spray Place 1 spray into both nostrils daily as needed for allergies.  . Glucosamine HCl (GLUCOSAMINE PO) Take 1,500 mg by mouth daily. (0800)  . Multiple Vitamin (MULTIVITAMIN WITH MINERALS) TABS tablet Take 1 tablet by mouth daily. (0800)          Allergies  Allergen Reactions  . Losartan Swelling    Angioedema  . 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 Lisinopril Swelling    angioedema       Past Medical History:  Diagnosis Date  . Allergy to alpha-gal   . Hypertension   . Impaired fasting glucose   . Persistent atrial fibrillation ([place]) 07/10/2012   cardioversion x 2  . Pneumonia    8th grade, without recurrence  . Ureteral stone         Family History  Problem Relation Age of Onset  . Hypertension Mother         Past Surgical History:  Procedure Laterality Date  . ATRIAL FIBRILLATION ABLATION  04/19/2014  . ATRIAL FIBRILLATION ABLATION N/A 04/19/2014   Procedure: ATRIAL FIBRILLATION ABLATION;  Surgeon: [name], MD;  Location: [facility] CATH LAB;  Service: Cardiovascular;  Laterality: N/A;  . CARDIOVERSION N/A 07/14/2012   Procedure: CARDIOVERSION;  Surgeon: [name], MD;  Location: [place];  Service: Cardiovascular;  Laterality: N/A;  . CARDIOVERSION N/A 09/16/2012   Procedure: CARDIOVERSION;  Surgeon: [name], MD;  Location: [facility] ENDOSCOPY;  Service: Cardiovascular;  Laterality: N/A;  . CARDIOVERSION N/A 03/14/2014   Procedure: CARDIOVERSION;  Surgeon: [name], MD;  Location: [facility] ENDOSCOPY;  Service: Cardiovascular;  Laterality: N/A;  . CARDIOVERSION N/A 04/02/2017   Procedure: CARDIOVERSION;  Surgeon: [name], MD;  Location: [place];  Service: Cardiovascular;  Laterality: N/A;  . COLONOSCOPY WITH PROPOFOL N/A 01/31/2013   Procedure: COLONOSCOPY WITH PROPOFOL;  Surgeon: [name], MD;  Location: [facility] ENDOSCOPY;  Service: Endoscopy;  Laterality: N/A;  .  FRACTURE SURGERY    . ORIF METACARPAL FRACTURE Right   . SHOULDER ARTHROSCOPY W/ ROTATOR CUFF REPAIR Bilateral   . TEE WITHOUT CARDIOVERSION N/A 07/14/2012   Procedure: TRANSESOPHAGEAL ECHOCARDIOGRAM (TEE);  Surgeon: [name], MD;  Location: [facility] ENDOSCOPY;  Service: Cardiovascular;  Laterality: N/A;  . TEE WITHOUT CARDIOVERSION N/A 09/16/2012   Procedure: TRANSESOPHAGEAL ECHOCARDIOGRAM (TEE);  Surgeon: [name], MD;  Location: [facility] ENDOSCOPY;  Service: Cardiovascular;  Laterality: N/A;  . TEE WITHOUT CARDIOVERSION N/A 04/18/2014   Procedure: TRANSESOPHAGEAL ECHOCARDIOGRAM (TEE);  Surgeon: [name], MD;  Location: [facility] ENDOSCOPY;  Service: Cardiovascular;  Laterality: N/A;  . TEE WITHOUT CARDIOVERSION N/A 04/02/2017   Procedure: TRANSESOPHAGEAL ECHOCARDIOGRAM (TEE);  Surgeon: [name], MD;  Location: [facility] ENDOSCOPY;  Service: Cardiovascular;  Laterality: N/A;   Social History        Socioeconomic History  . Marital status: Married    Spouse name: Not on file  . Number of children: Not on file  . Years of education: Not on file  . Highest education level: Not on file  Occupational History  . Not on file  Social Needs  . Financial resource strain: Not on file  . Food insecurity:    Worry: Not on file    Inability: Not on file  . Transportation needs:    Medical: Not on file    Non-medical: Not on file  Tobacco Use  . Smoking status: Never Smoker  . Smokeless tobacco: Never Used  Substance and Sexual Activity  . Alcohol use: Yes    Alcohol/week: 14.0 standard drinks    Types: 7 Glasses of wine, 7 Shots of liquor per week    Comment: 04/19/2014 "2-3 glasses of wine or liquor per day"  . Drug use: No  . Sexual activity: Not Currently  Lifestyle  . Physical activity:    Days per week: Not on file    Minutes per session: Not on file  . Stress: Not on file  Relationships  . Social connections:    Talks on phone: Not on file    Gets together:  Not on file    Attends religious service: Not on file    Active member of club or organization: Not on file    Attends meetings of clubs or organizations: Not on file    Relationship status: Not on file  . Intimate partner violence:    Fear of current or ex partner: Not on file    Emotionally abused: Not on file    Physically abused: Not on file    Forced sexual activity: Not on file  Other Topics Concern  . Not on file  Social History Narrative   Lives in [place] with spouse.  2 children (ages 79 and 44).   Owns [employer]     Review  of Systems: General: negative for chills, fever, night sweats or weight changes.  Cardiovascular: negative for chest pain, dyspnea on exertion, edema, orthopnea, palpitations, paroxysmal nocturnal dyspnea or shortness of breath Dermatological: negative for rash Respiratory: negative for cough or wheezing Urologic: negative for hematuria Abdominal: negative for nausea, vomiting, diarrhea, bright red blood per rectum, melena, or hematemesis Neurologic: negative for visual changes, syncope, or dizziness All other systems reviewed and are otherwise negative except as noted above.   Physical Exam:  Blood pressure 132/76, pulse 72, height 6' (1.829 m), weight 242 lb 6.4 oz (110 kg).  General appearance: alert, cooperative and no distress Neck: no carotid bruit and no JVD Lungs: clear to auscultation bilaterally Heart: regular rate and rhythm, S1, S2 normal, no murmur, click, rub or gallop Extremities: extremities normal, atraumatic, no cyanosis or edema Pulses: 2+ and symmetric Skin: Skin color, texture, turgor normal. No rashes or lesions Neurologic: Grossly normal  EKG NSR72 bpm -- personally reviewed   ASSESSMENT AND PLAN:   1. H/o Atrial Fibrillation: NSR on EKG. Rate is controlled. On Eliquis for a/c.   2. HTN: improved and well controlled today at 132/76. No changes made.   3. Pre-op: pt cleared  to undergo colonoscopy. Low risk. I discussed anticoagulation w/ Pharmacy. Pre office protocol, they recommend holding Eliquis 24 hrs prior to colonoscopy.    Follow-Up: keep f/u with Dr. [name] in Nov. F/u with Dr. Marlou Porch in 6 months.   Brittainy Ladoris Gene, MHS [facility] 12/17/2017 2:22 PM  EP Attending  Patient seen and examined. I was called yesterday by Dr. Roxan Hockey regarding Mr. Kissick. He is a pleasant 67 yo man with PAF who is s/p Afib ablation in 2016. He has done well but had recurrent atrial fib in December requiring DCCV. He had another episode a few months ago and converted on his own. He has been out of rhythm for a few days. He has not missed any of his Eliquis. He has been fasting today. He presents today for DCCV. His exam is as noted above. ECG demonstrates atrial fib with a  Controlled VR.   A/P 1. Persistent atrial fib - I have reviewed the indications/risks/benefits/goals/expectations of DCCV with the patient and he wishes to proceed.  Mikle Bosworth.D.

## 2017-12-21 NOTE — CV Procedure (Signed)
EP Procedure Note  Procedure Performed: DC CV  Preoperative diagnosis: atrial fib with a controlled VR  Postoperative diagnosis: same as preoperative diagnosis  Description of the procedure: after informed consent was obtained, the patient was prepped in the usual manner. The skin was shaved and the electrodispersive pads were placed in the AP position. The anesthesia service was utilized for deep sedation with IV propafol.   The patient was cardioverted with 200 Joules of synchronized biphasic energy delivered via the electrodispersive pads in the AP position. NSR was restored.   Complications: none immediately  Conclusion: successful DC cardioversion in a patient with persistent atrial fibrillation.  Mikle Bosworth.D.

## 2017-12-22 ENCOUNTER — Encounter (HOSPITAL_COMMUNITY): Payer: Self-pay | Admitting: Internal Medicine

## 2017-12-22 ENCOUNTER — Ambulatory Visit (HOSPITAL_COMMUNITY): Payer: 59 | Admitting: Nurse Practitioner

## 2017-12-23 NOTE — Telephone Encounter (Signed)
Set up appt with AFIB clinic, next available. Thanks [name], MD

## 2017-12-24 NOTE — Telephone Encounter (Signed)
Pt had outpatient cardioversion.  No need for At Fib appt at this time per Dr Marlou Porch.

## 2017-12-29 ENCOUNTER — Ambulatory Visit (HOSPITAL_COMMUNITY): Admission: RE | Admit: 2017-12-29 | Payer: 59 | Source: Ambulatory Visit | Admitting: Cardiology

## 2017-12-29 ENCOUNTER — Encounter (HOSPITAL_COMMUNITY): Admission: RE | Payer: Self-pay | Source: Ambulatory Visit

## 2017-12-29 SURGERY — CARDIOVERSION
Anesthesia: General

## 2018-01-06 ENCOUNTER — Telehealth: Payer: Self-pay | Admitting: Cardiology

## 2018-01-06 NOTE — Telephone Encounter (Signed)
Pt cannot stop plavix for 4 weeks after DCCV, to prevent stroke.  Please notify patient.

## 2018-01-06 NOTE — Telephone Encounter (Signed)
ASSESSMENT AND PLAN:   1. H/o Atrial Fibrillation: NSR on EKG. Rate is controlled. On Eliquis for a/c.   2. HTN: improved and well controlled today at 132/76. No changes made.   3. Pre-op: pt cleared to undergo colonoscopy. Low risk. I discussed anticoagulation w/ Pharmacy. Pre office protocol, they recommend holding Eliquis 24 hrs prior to colonoscopy.    Pt seen by Lyda Jester, PA-C on 9/6 and these instructions given.  Pt then had episode of Afib and was set up for DCCV.  Colonoscopy rescheduled and pt needs to know if ok to proceed and how long to hold Eliquis prior.

## 2018-01-06 NOTE — Telephone Encounter (Signed)
New Message   Patient is calling because he came in to see Ellen Henri on 12/17/2017 for a cardiac clearance to have a colonoscopy. . But then he went into Afib and had to have a cardiac clearance. So now he scheduled to have the colonoscopy again and wants to know is it okay for him to stop the Eliquis now so he can have the colonoscopy. Please call to discuss.

## 2018-01-07 NOTE — Telephone Encounter (Signed)
Spoke with pt re: upcoming procedure and him holding the Eliqius. Pt has been made aware that he will have to wait 4 weeks after his Cardioversion before he can hold his Eliquis. Pt verbalized understanding and thanked me for the call.

## 2018-01-07 NOTE — Telephone Encounter (Signed)
I verified with Dr. [name] on call for Dr. Lovena Le, must continue eliquis 4 weeks post DCCV before can hold for colonoscopy.

## 2018-01-19 ENCOUNTER — Ambulatory Visit: Payer: 59 | Admitting: Internal Medicine

## 2018-01-19 ENCOUNTER — Encounter: Payer: Self-pay | Admitting: Internal Medicine

## 2018-01-19 VITALS — BP 110/70 | HR 61 | Ht 72.0 in | Wt 235.2 lb

## 2018-01-19 DIAGNOSIS — Z01812 Encounter for preprocedural laboratory examination: Secondary | ICD-10-CM

## 2018-01-19 DIAGNOSIS — I480 Paroxysmal atrial fibrillation: Secondary | ICD-10-CM

## 2018-01-19 DIAGNOSIS — R072 Precordial pain: Secondary | ICD-10-CM

## 2018-01-19 DIAGNOSIS — R0683 Snoring: Secondary | ICD-10-CM

## 2018-01-19 MED ORDER — METOPROLOL TARTRATE 100 MG PO TABS: ORAL_TABLET | ORAL | 0 refills | Status: DC

## 2018-01-19 NOTE — Patient Instructions (Signed)
Medication Instructions:  Your physician recommends that you continue on your current medications as directed. Please refer to the Current Medication list given to you today.  Labwork: TODAY - BMET/CBC   Testing/Procedures: Your physician has recommended that you have an ablation on 02/15/18. Catheter ablation is a medical procedure used to treat some cardiac arrhythmias (irregular heartbeats). During catheter ablation, a long, thin, flexible tube is put into a blood vessel in your groin (upper thigh), or neck. This tube is called an ablation catheter. It is then guided to your heart through the blood vessel. Radio frequency waves destroy small areas of heart tissue where abnormal heartbeats may cause an arrhythmia to start. Please see the instruction sheet given to you today.  Your physician has requested that you have cardiac CT within 7 days prior to your ablation. Cardiac computed tomography (CT) is a painless test that uses an x-ray machine to take clear, detailed pictures of your heart.    Follow-Up: Follow-up appointment in  - 4 weeks after your procedure at the [facility] with [name], NP  - 3 months after your procedure with Dr. [name].   You have been referred to Dr. Carlena Sax for Sleep Evaluation     Instructions for your Ablation Procedure:  Please report to the [facility] Entrance of [facility] on 02/15/18  at 5:30 AM  Nothing to eat or drink after midnight the night prior to procedure  Do not take any medications the morning of the procedure  Plan 1 night stay   Someone will need to drive you home at discharge    Instructions for your Cardiac CT:  Please arrive at the [facility] main entrance of [facility] at ______ AM on________ (30-45 minutes prior to test start time)  [facility] [place], [place] [address] [phone]  Proceed to the [facility] Radiology Department (First Floor).  Please  follow these instructions carefully (unless otherwise directed):  Hold all erectile dysfunction medications at least 48 hours prior to test.  On the Night Before the Test: . Drink plenty of water. . Do not consume any caffeinated/decaffeinated beverages or chocolate 12 hours prior to your test. . Do not take any antihistamines 12 hours prior to your test.   On the Day of the Test: . Drink plenty of water. Do not drink any water within one hour of the test. . Do not eat any food 4 hours prior to the test. . You may take your regular medications prior to the test. . IF NOT ON A BETA BLOCKER - Take 100 mg of lopressor (metoprolol) two hours before the test.   After the Test: . Drink plenty of water. . After receiving IV contrast, you may experience a mild flushed feeling. This is normal. . On occasion, you may experience a mild rash up to 24 hours after the test. This is not dangerous. If this occurs, you can take Benadryl 25 mg and increase your fluid intake. . If you experience trouble breathing, this can be serious. If it is severe call 911 IMMEDIATELY. If it is mild, please call our office.    Cardiac Ablation Cardiac ablation is a procedure to stop some heart tissue from causing problems. The heart has many electrical connections. Sometimes these connections make the heart beat very fast or irregularly. Removing some problem areas can improve the heart rhythm or make it normal. What happens before the procedure?  Follow instructions from  your doctor about what you cannot eat or drink.  Ask your doctor about: ? Changing or stopping your normal medicines. This is important if you take diabetes medicines or blood thinners. ? Taking medicines such as aspirin and ibuprofen. These medicines can thin your blood. Do not take these medicines before your procedure if your doctor tells you not to.  Plan to have someone take you home.  If you will be going home right after the procedure,  plan to have someone with you for 24 hours. What happens during the procedure?  To lower your risk of infection: ? Your health care team will wash or sanitize their hands. ? Your skin will be washed with soap. ? Hair may be removed from your neck or groin.  An IV tube will be put into one of your veins.  You will be given a medicine to help you relax (sedative).  Skin on your neck or groin will be numbed.  A cut (incision) will be made in your neck or groin.  A needle will be put through your cut and into a vein in your neck or groin.  A tube (catheter) will be put into the needle. The tube will be moved to your heart. X-rays (fluoroscopy) will be used to help guide the tube.  Small devices (electrodes) on the tip of the tube will send out electrical currents.  Dye may be put through the tube. This helps your surgeon see your heart.  Electrical energy will be used to scar (ablate) some heart tissue. Your surgeon may use: ? Heat (radiofrequency energy). ? Laser energy. ? Extreme cold (cryoablation).  The tube will be taken out.  Pressure will be held on your cut. This helps stop bleeding.  A bandage (dressing) will be put on your cut. The procedure may vary. What happens after the procedure?  You will be monitored until your medicines have worn off.  Your cut will be watched for bleeding. You will need to lie still for a few hours.  Do not drive for 24 hours or as long as your doctor tells you. Summary  Cardiac ablation is a procedure to stop some heart tissue from causing problems.  Electrical energy will be used to scar (ablate) some heart tissue. This information is not intended to replace advice given to you by your health care provider. Make sure you discuss any questions you have with your health care provider. Document Released: 11/30/2012 Document Revised: 02/17/2016 Document Reviewed: 02/17/2016 Elsevier Interactive Patient Education  2017 [employer].

## 2018-01-19 NOTE — H&P (View-Only) (Signed)
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 male who presents today for routine electrophysiology followup.  Since last being seen in our clinic, the patient reports doing reasonably well.  He has had additional afib and required cardiovesrion 12/21/17. Today, he denies symptoms of palpitations, chest pain, shortness of breath,  lower extremity edema, dizziness, presyncope, or syncope.  The patient is otherwise without complaint today.   Past Medical History:  Diagnosis Date  . Allergy to alpha-gal   . Hypertension   . Impaired fasting glucose   . Persistent atrial fibrillation 07/10/2012   cardioversion x 2  . Pneumonia    8th grade, without recurrence  . Ureteral stone    Past Surgical History:  Procedure Laterality Date  . ATRIAL FIBRILLATION ABLATION  04/19/2014  . ATRIAL FIBRILLATION ABLATION N/A 04/19/2014   Procedure: ATRIAL FIBRILLATION ABLATION;  Surgeon: [name], MD;  Location: [facility] CATH LAB;  Service: Cardiovascular;  Laterality: N/A;  . CARDIOVERSION N/A 07/14/2012   Procedure: CARDIOVERSION;  Surgeon: [name], MD;  Location: [place];  Service: Cardiovascular;  Laterality: N/A;  . CARDIOVERSION N/A 09/16/2012   Procedure: CARDIOVERSION;  Surgeon: [name], MD;  Location: [facility] ENDOSCOPY;  Service: Cardiovascular;  Laterality: N/A;  . CARDIOVERSION N/A 03/14/2014   Procedure: CARDIOVERSION;  Surgeon: [name], MD;  Location: [facility] ENDOSCOPY;  Service: Cardiovascular;  Laterality: N/A;  . CARDIOVERSION N/A 04/02/2017   Procedure: CARDIOVERSION;  Surgeon: [name], MD;  Location: [facility] ENDOSCOPY;  Service: Cardiovascular;  Laterality: N/A;  . CARDIOVERSION N/A 12/21/2017   Procedure: CARDIOVERSION;  Surgeon: [name], MD;  Location: [facility] ENDOSCOPY;  Service: Cardiovascular;  Laterality: N/A;  . COLONOSCOPY WITH PROPOFOL N/A 01/31/2013   Procedure: COLONOSCOPY WITH PROPOFOL;  Surgeon: [name], MD;  Location: [facility] ENDOSCOPY;  Service: Endoscopy;  Laterality: N/A;  . FRACTURE SURGERY    . ORIF METACARPAL FRACTURE Right   . SHOULDER ARTHROSCOPY W/ ROTATOR CUFF REPAIR Bilateral   . TEE WITHOUT CARDIOVERSION N/A 07/14/2012   Procedure: TRANSESOPHAGEAL ECHOCARDIOGRAM (TEE);  Surgeon: [name], MD;  Location: [facility] ENDOSCOPY;  Service: Cardiovascular;  Laterality: N/A;  . TEE WITHOUT CARDIOVERSION N/A 09/16/2012   Procedure: TRANSESOPHAGEAL ECHOCARDIOGRAM (TEE);  Surgeon: [name], MD;  Location: [facility] ENDOSCOPY;  Service: Cardiovascular;  Laterality: N/A;  . TEE WITHOUT CARDIOVERSION N/A 04/18/2014   Procedure: TRANSESOPHAGEAL ECHOCARDIOGRAM (TEE);  Surgeon: [name], MD;  Location: [facility] ENDOSCOPY;  Service: Cardiovascular;  Laterality: N/A;  . TEE WITHOUT CARDIOVERSION N/A 04/02/2017   Procedure: TRANSESOPHAGEAL ECHOCARDIOGRAM (TEE);  Surgeon: [name], MD;  Location: [facility] ENDOSCOPY;  Service: Cardiovascular;  Laterality: N/A;    ROS- all systems are reviewed and negatives except as per HPI above  Current Outpatient Medications  Medication Sig Dispense Refill  . amLODipine (NORVASC) 5 MG tablet Take 1 tablet (5 mg total) by mouth daily. 90 tablet 3  . Ascorbic Acid (VITAMIN C) 1000 MG tablet Take 1,000 mg by mouth daily. (0800)    . cetirizine (ZYRTEC) 10 MG tablet Take 10 mg by mouth daily. (0800)    . DILT-XR 240 MG 24 hr capsule Take 240 mg by mouth daily.  3  . diltiazem (CARDIZEM CD) 120 MG 24 hr capsule Take 1 capsule (120 mg total) by mouth 2 (two) times daily as needed. 60 capsule 2  . ELIQUIS 5 MG TABS tablet TAKE 1 TABLET BY MOUTH TWICE A DAY  60 tablet 9  . fluticasone (FLONASE) 50 MCG/ACT nasal spray Place 1 spray into both nostrils daily as needed for allergies.    . Glucosamine HCl (GLUCOSAMINE PO) Take 1,500 mg by mouth daily. (0800)    . Multiple Vitamin (MULTIVITAMIN WITH MINERALS) TABS tablet Take 1 tablet by mouth daily. (0800)     No current  facility-administered medications for this visit.     Physical Exam: Vitals:   01/19/18 1103  BP: 110/70  Pulse: 61  SpO2: 98%  Weight: 235 lb 3.2 oz (106.7 kg)  Height: 6' (1.829 m)    GEN- The patient is well appearing, alert and oriented x 3 today.   Head- normocephalic, atraumatic Eyes-  Sclera clear, conjunctiva pink Ears- hearing intact Oropharynx- clear Lungs- Clear to ausculation bilaterally, normal work of breathing Heart- Regular rate and rhythm, no murmurs, rubs or gallops, PMI not laterally displaced GI- soft, NT, ND, + BS Extremities- no clubbing, cyanosis, or edema  Wt Readings from Last 3 Encounters:  01/19/18 235 lb 3.2 oz (106.7 kg)  12/17/17 242 lb 6.4 oz (110 kg)  11/09/17 242 lb 6.4 oz (110 kg)    EKG tracing ordered today is personally reviewed and shows sinus rhythm 61 bpm, PR 204 msec, QRS 98 msec, QTc 394 msec  Assessment and Plan:  1. Persistent afib The patient has symptomatic, recurrent persistent atrial fibrillation. He is s/p ablation 2016 Chads2vasc score is 3.  he is anticoagulated with eliquis . Therapeutic strategies for afib including medicine and ablation were discussed in detail with the patient today. Risk, benefits, and alternatives to EP study and radiofrequency ablation for afib were also discussed in detail today. These risks include but are not limited to stroke, bleeding, vascular damage, tamponade, perforation, damage to the esophagus, lungs, and other structures, pulmonary vein stenosis, worsening renal function, and death. The patient understands these risk and wishes to proceed.  We will therefore proceed with catheter ablation at the next available time.  Carto, ICE, anesthesia are requested for the procedure.  Will also obtain cardiac CT prior to the procedure to exclude LAA thrombus and further evaluate atrial anatomy.  2. HTN Stable No change required today  3. Snoring Refer to Dr Carlena Sax (per patient request) for  sleep study  [name] MD, [facility] 01/19/2018 11:38 AM

## 2018-01-19 NOTE — Progress Notes (Signed)
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 male who presents today for routine electrophysiology followup.  Since last being seen in our clinic, the patient reports doing reasonably well.  He has had additional afib and required cardiovesrion 12/21/17. Today, he denies symptoms of palpitations, chest pain, shortness of breath,  lower extremity edema, dizziness, presyncope, or syncope.  The patient is otherwise without complaint today.   Past Medical History:  Diagnosis Date  . Allergy to alpha-gal   . Hypertension   . Impaired fasting glucose   . Persistent atrial fibrillation 07/10/2012   cardioversion x 2  . Pneumonia    8th grade, without recurrence  . Ureteral stone    Past Surgical History:  Procedure Laterality Date  . ATRIAL FIBRILLATION ABLATION  04/19/2014  . ATRIAL FIBRILLATION ABLATION N/A 04/19/2014   Procedure: ATRIAL FIBRILLATION ABLATION;  Surgeon: [name], MD;  Location: [facility] CATH LAB;  Service: Cardiovascular;  Laterality: N/A;  . CARDIOVERSION N/A 07/14/2012   Procedure: CARDIOVERSION;  Surgeon: [name], MD;  Location: [place];  Service: Cardiovascular;  Laterality: N/A;  . CARDIOVERSION N/A 09/16/2012   Procedure: CARDIOVERSION;  Surgeon: [name], MD;  Location: [facility] ENDOSCOPY;  Service: Cardiovascular;  Laterality: N/A;  . CARDIOVERSION N/A 03/14/2014   Procedure: CARDIOVERSION;  Surgeon: [name], MD;  Location: [facility] ENDOSCOPY;  Service: Cardiovascular;  Laterality: N/A;  . CARDIOVERSION N/A 04/02/2017   Procedure: CARDIOVERSION;  Surgeon: [name], MD;  Location: [facility] ENDOSCOPY;  Service: Cardiovascular;  Laterality: N/A;  . CARDIOVERSION N/A 12/21/2017   Procedure: CARDIOVERSION;  Surgeon: [name], MD;  Location: [facility] ENDOSCOPY;  Service: Cardiovascular;  Laterality: N/A;  . COLONOSCOPY WITH PROPOFOL N/A 01/31/2013   Procedure: COLONOSCOPY WITH PROPOFOL;  Surgeon: [name], MD;  Location: [facility] ENDOSCOPY;  Service: Endoscopy;  Laterality: N/A;  . FRACTURE SURGERY    . ORIF METACARPAL FRACTURE Right   . SHOULDER ARTHROSCOPY W/ ROTATOR CUFF REPAIR Bilateral   . TEE WITHOUT CARDIOVERSION N/A 07/14/2012   Procedure: TRANSESOPHAGEAL ECHOCARDIOGRAM (TEE);  Surgeon: [name], MD;  Location: [facility] ENDOSCOPY;  Service: Cardiovascular;  Laterality: N/A;  . TEE WITHOUT CARDIOVERSION N/A 09/16/2012   Procedure: TRANSESOPHAGEAL ECHOCARDIOGRAM (TEE);  Surgeon: [name], MD;  Location: [facility] ENDOSCOPY;  Service: Cardiovascular;  Laterality: N/A;  . TEE WITHOUT CARDIOVERSION N/A 04/18/2014   Procedure: TRANSESOPHAGEAL ECHOCARDIOGRAM (TEE);  Surgeon: [name], MD;  Location: [facility] ENDOSCOPY;  Service: Cardiovascular;  Laterality: N/A;  . TEE WITHOUT CARDIOVERSION N/A 04/02/2017   Procedure: TRANSESOPHAGEAL ECHOCARDIOGRAM (TEE);  Surgeon: [name], MD;  Location: [facility] ENDOSCOPY;  Service: Cardiovascular;  Laterality: N/A;    ROS- all systems are reviewed and negatives except as per HPI above  Current Outpatient Medications  Medication Sig Dispense Refill  . amLODipine (NORVASC) 5 MG tablet Take 1 tablet (5 mg total) by mouth daily. 90 tablet 3  . Ascorbic Acid (VITAMIN C) 1000 MG tablet Take 1,000 mg by mouth daily. (0800)    . cetirizine (ZYRTEC) 10 MG tablet Take 10 mg by mouth daily. (0800)    . DILT-XR 240 MG 24 hr capsule Take 240 mg by mouth daily.  3  . diltiazem (CARDIZEM CD) 120 MG 24 hr capsule Take 1 capsule (120 mg total) by mouth 2 (two) times daily as needed. 60 capsule 2  . ELIQUIS 5 MG TABS tablet TAKE 1 TABLET BY MOUTH TWICE A DAY  60 tablet 9  . fluticasone (FLONASE) 50 MCG/ACT nasal spray Place 1 spray into both nostrils daily as needed for allergies.    . Glucosamine HCl (GLUCOSAMINE PO) Take 1,500 mg by mouth daily. (0800)    . Multiple Vitamin (MULTIVITAMIN WITH MINERALS) TABS tablet Take 1 tablet by mouth daily. (0800)     No current  facility-administered medications for this visit.     Physical Exam: Vitals:   01/19/18 1103  BP: 110/70  Pulse: 61  SpO2: 98%  Weight: 235 lb 3.2 oz (106.7 kg)  Height: 6' (1.829 m)    GEN- The patient is well appearing, alert and oriented x 3 today.   Head- normocephalic, atraumatic Eyes-  Sclera clear, conjunctiva pink Ears- hearing intact Oropharynx- clear Lungs- Clear to ausculation bilaterally, normal work of breathing Heart- Regular rate and rhythm, no murmurs, rubs or gallops, PMI not laterally displaced GI- soft, NT, ND, + BS Extremities- no clubbing, cyanosis, or edema  Wt Readings from Last 3 Encounters:  01/19/18 235 lb 3.2 oz (106.7 kg)  12/17/17 242 lb 6.4 oz (110 kg)  11/09/17 242 lb 6.4 oz (110 kg)    EKG tracing ordered today is personally reviewed and shows sinus rhythm 61 bpm, PR 204 msec, QRS 98 msec, QTc 394 msec  Assessment and Plan:  1. Persistent afib The patient has symptomatic, recurrent persistent atrial fibrillation. He is s/p ablation 2016 Chads2vasc score is 3.  he is anticoagulated with eliquis . Therapeutic strategies for afib including medicine and ablation were discussed in detail with the patient today. Risk, benefits, and alternatives to EP study and radiofrequency ablation for afib were also discussed in detail today. These risks include but are not limited to stroke, bleeding, vascular damage, tamponade, perforation, damage to the esophagus, lungs, and other structures, pulmonary vein stenosis, worsening renal function, and death. The patient understands these risk and wishes to proceed.  We will therefore proceed with catheter ablation at the next available time.  Carto, ICE, anesthesia are requested for the procedure.  Will also obtain cardiac CT prior to the procedure to exclude LAA thrombus and further evaluate atrial anatomy.  2. HTN Stable No change required today  3. Snoring Refer to Dr Carlena Sax (per patient request) for  sleep study  [name] MD, [facility] 01/19/2018 11:38 AM

## 2018-01-20 LAB — BASIC METABOLIC PANEL
BUN / CREAT RATIO: 15 (ref 10–24)
BUN: 11 mg/dL (ref 8–27)
CHLORIDE: 102 mmol/L (ref 96–106)
CO2: 24 mmol/L (ref 20–29)
CREATININE: 0.72 mg/dL — AB (ref 0.76–1.27)
Calcium: 9.5 mg/dL (ref 8.6–10.2)
GFR calc non Af Amer: 97 mL/min/{1.73_m2} (ref >59)
GFR, EST AFRICAN AMERICAN: 112 mL/min/{1.73_m2} (ref >59)
Glucose: 106 mg/dL — ABNORMAL HIGH (ref 65–99)
POTASSIUM: 4.5 mmol/L (ref 3.5–5.2)
Sodium: 141 mmol/L (ref 134–144)

## 2018-01-20 LAB — CBC
Hematocrit: 46.7 % (ref 37.5–51.0)
Hemoglobin: 15.5 g/dL (ref 13.0–17.7)
MCH: 28.8 pg (ref 26.6–33.0)
MCHC: 33.2 g/dL (ref 31.5–35.7)
MCV: 87 fL (ref 79–97)
Platelets: 207 10*3/uL (ref 150–450)
RBC: 5.39 x10E6/uL (ref 4.14–5.80)
RDW: 13 % (ref 12.3–15.4)
WBC: 6.2 10*3/uL (ref 3.4–10.8)

## 2018-01-31 ENCOUNTER — Other Ambulatory Visit: Payer: Self-pay | Admitting: Cardiology

## 2018-02-09 ENCOUNTER — Ambulatory Visit (HOSPITAL_COMMUNITY): Admission: RE | Admit: 2018-02-09 | Payer: 59 | Source: Ambulatory Visit

## 2018-02-09 ENCOUNTER — Ambulatory Visit (HOSPITAL_COMMUNITY)
Admission: RE | Admit: 2018-02-09 | Discharge: 2018-02-09 | Disposition: A | Discharge status: Home / Self Care | Payer: 59 | Source: Ambulatory Visit | Attending: Internal Medicine | Admitting: Internal Medicine

## 2018-02-09 DIAGNOSIS — K449 Diaphragmatic hernia without obstruction or gangrene: Secondary | ICD-10-CM | POA: Diagnosis not present

## 2018-02-09 DIAGNOSIS — R072 Precordial pain: Secondary | ICD-10-CM | POA: Diagnosis present

## 2018-02-09 DIAGNOSIS — I480 Paroxysmal atrial fibrillation: Secondary | ICD-10-CM | POA: Insufficient documentation

## 2018-02-09 MED ORDER — IOPAMIDOL (ISOVUE-370) INJECTION 76%
100.0000 mL | Freq: Once | INTRAVENOUS | Status: AC | PRN
  Administered 2018-02-09: 80 mL via INTRAVENOUS

## 2018-02-09 MED ORDER — IOPAMIDOL (ISOVUE-370) INJECTION 76%
INTRAVENOUS | Status: AC
  Filled 2018-02-09: qty 100

## 2018-02-11 ENCOUNTER — Telehealth: Payer: Self-pay | Admitting: Cardiology

## 2018-02-11 NOTE — Telephone Encounter (Signed)
  Patient is scheduled for ablasion on 02/15/18. Patient said preop notes said not take any medications that morning and he wants to verify that includes the Eliquis

## 2018-02-11 NOTE — Telephone Encounter (Signed)
Spoke to patient and reminded him to NOT take any medications, including Eliquis, the morning of his ablation.

## 2018-02-11 NOTE — Telephone Encounter (Signed)
Left detailed message for pt.  Pt SHOULD NOT TAKE ANY MEDICATION the morning of his procedure.  This includes Eliquis.

## 2018-02-14 ENCOUNTER — Ambulatory Visit: Payer: 59 | Admitting: Internal Medicine

## 2018-02-14 NOTE — Anesthesia Preprocedure Evaluation (Addendum)
Anesthesia Evaluation  Patient identified by MRN, date of birth, ID band Patient awake    Reviewed: Allergy & Precautions, NPO status , Patient's Chart, lab work & pertinent test results  History of Anesthesia Complications Negative for: history of anesthetic complications  Airway Mallampati: II  TM Distance: >3 FB Neck ROM: Full    Dental no notable dental hx. (+) Dental Advisory Given   Pulmonary neg pulmonary ROS,    Pulmonary exam normal        Cardiovascular hypertension, + dysrhythmias Atrial Fibrillation  Rhythm:Irregular Rate:Normal     Neuro/Psych negative neurological ROS     GI/Hepatic negative GI ROS, Neg liver ROS,   Endo/Other  negative endocrine ROS  Renal/GU negative Renal ROS     Musculoskeletal negative musculoskeletal ROS (+)   Abdominal   Peds  Hematology negative hematology ROS (+)   Anesthesia Other Findings   Reproductive/Obstetrics                            Anesthesia Physical  Anesthesia Plan  ASA: III  Anesthesia Plan: General   Post-op Pain Management:    Induction: Intravenous  PONV Risk Score and Plan: 2 and Ondansetron and Dexamethasone  Airway Management Planned: Oral ETT  Additional Equipment: Arterial line  Intra-op Plan:   Post-operative Plan: Extubation in OR  Informed Consent: I have reviewed the patients History and Physical, chart, labs and discussed the procedure including the risks, benefits and alternatives for the proposed anesthesia with the patient or authorized representative who has indicated his/her understanding and acceptance.   Dental advisory given  Plan Discussed with: CRNA, Anesthesiologist and Surgeon  Anesthesia Plan Comments:        Anesthesia Quick Evaluation

## 2018-02-15 ENCOUNTER — Other Ambulatory Visit: Payer: Self-pay

## 2018-02-15 ENCOUNTER — Encounter (HOSPITAL_COMMUNITY): Payer: Self-pay | Admitting: Certified Registered"

## 2018-02-15 ENCOUNTER — Ambulatory Visit (HOSPITAL_COMMUNITY)
Admission: RE | Admit: 2018-02-15 | Discharge: 2018-02-15 | Disposition: A | Discharge status: Home / Self Care | Payer: 59 | Source: Ambulatory Visit | Attending: Internal Medicine | Admitting: Internal Medicine

## 2018-02-15 ENCOUNTER — Ambulatory Visit (HOSPITAL_COMMUNITY): Payer: 59 | Admitting: Anesthesiology

## 2018-02-15 ENCOUNTER — Encounter (HOSPITAL_COMMUNITY)
Admission: RE | Disposition: A | Discharge status: Home / Self Care | Payer: Self-pay | Source: Ambulatory Visit | Attending: Internal Medicine

## 2018-02-15 DIAGNOSIS — Z9889 Other specified postprocedural states: Secondary | ICD-10-CM | POA: Insufficient documentation

## 2018-02-15 DIAGNOSIS — Z6829 Body mass index (BMI) 29.0-29.9, adult: Secondary | ICD-10-CM | POA: Insufficient documentation

## 2018-02-15 DIAGNOSIS — E669 Obesity, unspecified: Secondary | ICD-10-CM | POA: Insufficient documentation

## 2018-02-15 DIAGNOSIS — I4819 Other persistent atrial fibrillation: Secondary | ICD-10-CM | POA: Diagnosis not present

## 2018-02-15 DIAGNOSIS — I499 Cardiac arrhythmia, unspecified: Secondary | ICD-10-CM | POA: Diagnosis not present

## 2018-02-15 DIAGNOSIS — R0683 Snoring: Secondary | ICD-10-CM | POA: Insufficient documentation

## 2018-02-15 DIAGNOSIS — Z7901 Long term (current) use of anticoagulants: Secondary | ICD-10-CM | POA: Diagnosis not present

## 2018-02-15 DIAGNOSIS — Z79899 Other long term (current) drug therapy: Secondary | ICD-10-CM | POA: Diagnosis not present

## 2018-02-15 DIAGNOSIS — I10 Essential (primary) hypertension: Secondary | ICD-10-CM | POA: Insufficient documentation

## 2018-02-15 HISTORY — PX: ATRIAL FIBRILLATION ABLATION: EP1191

## 2018-02-15 LAB — POCT ACTIVATED CLOTTING TIME
ACTIVATED CLOTTING TIME: 246 s
ACTIVATED CLOTTING TIME: 208 s
ACTIVATED CLOTTING TIME: 175 s
Activated Clotting Time: 279 seconds
Activated Clotting Time: 323 seconds
Activated Clotting Time: 191 seconds

## 2018-02-15 SURGERY — ATRIAL FIBRILLATION ABLATION
Anesthesia: General

## 2018-02-15 MED ORDER — HEPARIN SODIUM (PORCINE) 1000 UNIT/ML IJ SOLN
INTRAMUSCULAR | Status: DC | PRN
  Administered 2018-02-15: 12000 [IU] via INTRAVENOUS
  Administered 2018-02-15: 4000 [IU] via INTRAVENOUS
  Administered 2018-02-15 (×2): 5000 [IU] via INTRAVENOUS

## 2018-02-15 MED ORDER — SODIUM CHLORIDE 0.9% FLUSH: 3.0000 mL | Freq: Two times a day (BID) | INTRAVENOUS | Status: DC

## 2018-02-15 MED ORDER — APIXABAN 5 MG PO TABS
5.0000 mg | ORAL_TABLET | Freq: Once | ORAL | Status: AC
  Administered 2018-02-15: 5 mg via ORAL
  Filled 2018-02-15: qty 1

## 2018-02-15 MED ORDER — FENTANYL CITRATE (PF) 100 MCG/2ML IJ SOLN
INTRAMUSCULAR | Status: DC | PRN
  Administered 2018-02-15 (×3): 50 ug via INTRAVENOUS

## 2018-02-15 MED ORDER — SODIUM CHLORIDE 0.9% FLUSH: 3.0000 mL | INTRAVENOUS | Status: DC | PRN

## 2018-02-15 MED ORDER — LIDOCAINE 2% (20 MG/ML) 5 ML SYRINGE
INTRAMUSCULAR | Status: DC | PRN
  Administered 2018-02-15: 100 mg via INTRAVENOUS

## 2018-02-15 MED ORDER — ONDANSETRON HCL 4 MG/2ML IJ SOLN
INTRAMUSCULAR | Status: DC | PRN
  Administered 2018-02-15: 4 mg via INTRAVENOUS

## 2018-02-15 MED ORDER — SODIUM CHLORIDE 0.9 % IV SOLN: 250.0000 mL | INTRAVENOUS | Status: DC | PRN

## 2018-02-15 MED ORDER — PANTOPRAZOLE SODIUM 40 MG PO TBEC: 40.0000 mg | DELAYED_RELEASE_TABLET | Freq: Every day | ORAL | 0 refills | Status: DC

## 2018-02-15 MED ORDER — ACETAMINOPHEN 325 MG PO TABS: 650.0000 mg | ORAL_TABLET | ORAL | Status: DC | PRN

## 2018-02-15 MED ORDER — HEPARIN SODIUM (PORCINE) 1000 UNIT/ML IJ SOLN
INTRAMUSCULAR | Status: DC | PRN
  Administered 2018-02-15: 1000 [IU] via INTRAVENOUS

## 2018-02-15 MED ORDER — DEXAMETHASONE SODIUM PHOSPHATE 10 MG/ML IJ SOLN
INTRAMUSCULAR | Status: DC | PRN
  Administered 2018-02-15: 10 mg via INTRAVENOUS

## 2018-02-15 MED ORDER — BUPIVACAINE HCL (PF) 0.25 % IJ SOLN
INTRAMUSCULAR | Status: AC
  Filled 2018-02-15: qty 30

## 2018-02-15 MED ORDER — SUGAMMADEX SODIUM 200 MG/2ML IV SOLN
INTRAVENOUS | Status: DC | PRN
  Administered 2018-02-15: 225 mg via INTRAVENOUS

## 2018-02-15 MED ORDER — ONDANSETRON HCL 4 MG/2ML IJ SOLN: 4.0000 mg | Freq: Four times a day (QID) | INTRAMUSCULAR | Status: DC | PRN

## 2018-02-15 MED ORDER — PROTAMINE SULFATE 10 MG/ML IV SOLN
INTRAVENOUS | Status: DC | PRN
  Administered 2018-02-15: 40 mg via INTRAVENOUS

## 2018-02-15 MED ORDER — ROCURONIUM BROMIDE 10 MG/ML (PF) SYRINGE
PREFILLED_SYRINGE | INTRAVENOUS | Status: DC | PRN
  Administered 2018-02-15 (×2): 20 mg via INTRAVENOUS
  Administered 2018-02-15: 50 mg via INTRAVENOUS
  Administered 2018-02-15: 10 mg via INTRAVENOUS

## 2018-02-15 MED ORDER — HYDROCODONE-ACETAMINOPHEN 5-325 MG PO TABS: 1.0000 | ORAL_TABLET | ORAL | Status: DC | PRN

## 2018-02-15 MED ORDER — SODIUM CHLORIDE 0.9 % IV SOLN
INTRAVENOUS | Status: DC
  Administered 2018-02-15 (×2): via INTRAVENOUS

## 2018-02-15 MED ORDER — PROPOFOL 10 MG/ML IV BOLUS
INTRAVENOUS | Status: DC | PRN
  Administered 2018-02-15: 30 mg via INTRAVENOUS
  Administered 2018-02-15: 200 mg via INTRAVENOUS
  Administered 2018-02-15: 30 mg via INTRAVENOUS

## 2018-02-15 MED ORDER — MIDAZOLAM HCL 5 MG/5ML IJ SOLN
INTRAMUSCULAR | Status: DC | PRN
  Administered 2018-02-15 (×2): 1 mg via INTRAVENOUS

## 2018-02-15 MED ORDER — HEPARIN SODIUM (PORCINE) 1000 UNIT/ML IJ SOLN
INTRAMUSCULAR | Status: AC
  Filled 2018-02-15: qty 1

## 2018-02-15 MED ORDER — SODIUM CHLORIDE 0.9 % IV SOLN
INTRAVENOUS | Status: DC | PRN
  Administered 2018-02-15: 20 ug/min via INTRAVENOUS

## 2018-02-15 MED ORDER — BUPIVACAINE HCL (PF) 0.25 % IJ SOLN
INTRAMUSCULAR | Status: DC | PRN
  Administered 2018-02-15: 20 mL

## 2018-02-15 SURGICAL SUPPLY — 18 items
BLANKET WARM UNDERBOD FULL ACC (MISCELLANEOUS) ×2 IMPLANT
CATH MAPPNG PENTARAY F 2-6-2MM (CATHETERS) IMPLANT
CATH NAVISTAR SMARTTOUCH DF (ABLATOR) ×2 IMPLANT
CATH SOUNDSTAR 3D IMAGING (CATHETERS) ×2 IMPLANT
CATH WEBSTER BI DIR CS D-F CRV (CATHETERS) ×2 IMPLANT
COVER SWIFTLINK CONNECTOR (BAG) ×2 IMPLANT
NDL BAYLIS TRANSSEPTAL 71CM (NEEDLE) IMPLANT
NEEDLE BAYLIS TRANSSEPTAL 71CM (NEEDLE) ×3 IMPLANT
PACK EP LATEX FREE (CUSTOM PROCEDURE TRAY) ×3
PACK EP LF (CUSTOM PROCEDURE TRAY) ×1 IMPLANT
PAD PRO RADIOLUCENT 2001M-C (PAD) ×3 IMPLANT
PATCH CARTO3 (PAD) ×2 IMPLANT
PENTARAY F 2-6-2MM (CATHETERS) ×3
SHEATH AVANTI 11F 11CM (SHEATH) ×2 IMPLANT
SHEATH PINNACLE 7F 10CM (SHEATH) ×4 IMPLANT
SHEATH PINNACLE 9F 10CM (SHEATH) ×2 IMPLANT
SHEATH SWARTZ TS SL2 63CM 8.5F (SHEATH) ×2 IMPLANT
TUBING SMART ABLATE COOLFLOW (TUBING) ×2 IMPLANT

## 2018-02-15 NOTE — Anesthesia Procedure Notes (Signed)
Procedure Name: Intubation Date/Time: 02/15/2018 7:46 AM Performed by: Gaylene Brooks, CRNA Pre-anesthesia Checklist: Patient identified, Emergency Drugs available, Suction available and Patient being monitored Patient Re-evaluated:Patient Re-evaluated prior to induction Oxygen Delivery Method: Circle System Utilized Preoxygenation: Pre-oxygenation with 100% oxygen Induction Type: IV induction Ventilation: Mask ventilation without difficulty and Oral airway inserted - appropriate to patient size Laryngoscope Size: Sabra Heck and 2 Grade View: Grade II Tube type: Oral Tube size: 7.5 mm Number of attempts: 1 Airway Equipment and Method: Stylet and Oral airway Placement Confirmation: ETT inserted through vocal cords under direct vision,  positive ETCO2 and breath sounds checked- equal and bilateral Secured at: 23 cm Tube secured with: Tape Dental Injury: Teeth and Oropharynx as per pre-operative assessment

## 2018-02-15 NOTE — Anesthesia Postprocedure Evaluation (Signed)
Anesthesia Post Note  Patient: Reginald [name]  Procedure(s) Performed: ATRIAL FIBRILLATION ABLATION (N/A )     Patient location during evaluation: PACU Anesthesia Type: General Level of consciousness: sedated Pain management: pain level controlled Vital Signs Assessment: post-procedure vital signs reviewed and stable Respiratory status: spontaneous breathing and respiratory function stable Cardiovascular status: stable Postop Assessment: no apparent nausea or vomiting Anesthetic complications: no    Last Vitals:  Vitals:   02/15/18 1111 02/15/18 1116  BP: 138/71   Pulse: 74   Temp:  36.6 C  SpO2:      Last Pain:  Vitals:   02/15/18 1111  TempSrc: Temporal  PainSc: 0-No pain                 Omkar Stratmann DANIEL

## 2018-02-15 NOTE — Discharge Instructions (Signed)

## 2018-02-15 NOTE — Transfer of Care (Signed)
Immediate Anesthesia Transfer of Care Note  Patient: [name]  Procedure(s) Performed: ATRIAL FIBRILLATION ABLATION (N/A )  Patient Location: Cath Lab  Anesthesia Type:General  Level of Consciousness: awake, alert  and oriented  Airway & Oxygen Therapy: Patient Spontanous Breathing and Patient connected to nasal cannula oxygen  Post-op Assessment: Report given to RN, Post -op Vital signs reviewed and stable and Patient moving all extremities X 4  Post vital signs: Reviewed and stable  Last Vitals:  Vitals Value Taken Time  BP    Temp    Pulse    Resp    SpO2      Last Pain:  Vitals:   02/15/18 0606  TempSrc:   PainSc: 0-No pain      Patients Stated Pain Goal: 3 (21/11/55 2080)  Complications: No apparent anesthesia complications

## 2018-02-15 NOTE — CV Procedure (Signed)
7 Fr., 9 Fr., and a 11 Fr. Femoral Vein sheat's were pulled post A-Fib Ablation procedure. R groin area is non tender, and without any hematoma or oozing.   Pt tolerated the procedure well.

## 2018-02-15 NOTE — Interval H&P Note (Signed)
History and Physical Interval Note:  02/15/2018 7:10 AM  Reginald [name]  has presented today for surgery, with the diagnosis of afib  The various methods of treatment have been discussed with the patient and family. After consideration of risks, benefits and other options for treatment, the patient has consented to  Procedure(s): ATRIAL FIBRILLATION ABLATION (N/A) as a surgical intervention .  The patient's history has been reviewed, patient examined, no change in status, stable for surgery.  I have reviewed the patient's chart and labs.  Questions were answered to the patient's satisfaction.    Cardiac CT  Reviewed at length with him today.  He reports compliance with eliquis without interruption.   [name]

## 2018-02-16 ENCOUNTER — Encounter (HOSPITAL_COMMUNITY): Payer: Self-pay | Admitting: Internal Medicine

## 2018-03-11 ENCOUNTER — Other Ambulatory Visit: Payer: Self-pay | Admitting: Internal Medicine

## 2018-03-18 ENCOUNTER — Ambulatory Visit (HOSPITAL_COMMUNITY)
Admission: RE | Admit: 2018-03-18 | Discharge: 2018-03-18 | Disposition: A | Discharge status: Home / Self Care | Payer: 59 | Source: Ambulatory Visit | Attending: Nurse Practitioner | Admitting: Nurse Practitioner

## 2018-03-18 ENCOUNTER — Encounter (HOSPITAL_COMMUNITY): Payer: Self-pay | Admitting: Nurse Practitioner

## 2018-03-18 VITALS — BP 142/86 | HR 78 | Ht 74.0 in | Wt 234.0 lb

## 2018-03-18 DIAGNOSIS — Z7901 Long term (current) use of anticoagulants: Secondary | ICD-10-CM | POA: Insufficient documentation

## 2018-03-18 DIAGNOSIS — Z9889 Other specified postprocedural states: Secondary | ICD-10-CM | POA: Diagnosis not present

## 2018-03-18 DIAGNOSIS — I480 Paroxysmal atrial fibrillation: Secondary | ICD-10-CM

## 2018-03-18 DIAGNOSIS — I10 Essential (primary) hypertension: Secondary | ICD-10-CM | POA: Diagnosis not present

## 2018-03-18 DIAGNOSIS — Z91018 Allergy to other foods: Secondary | ICD-10-CM | POA: Diagnosis not present

## 2018-03-18 DIAGNOSIS — I4891 Unspecified atrial fibrillation: Secondary | ICD-10-CM | POA: Insufficient documentation

## 2018-03-18 DIAGNOSIS — Z8249 Family history of ischemic heart disease and other diseases of the circulatory system: Secondary | ICD-10-CM | POA: Diagnosis not present

## 2018-03-18 DIAGNOSIS — Z79899 Other long term (current) drug therapy: Secondary | ICD-10-CM | POA: Diagnosis not present

## 2018-03-18 DIAGNOSIS — Z888 Allergy status to other drugs, medicaments and biological substances status: Secondary | ICD-10-CM | POA: Insufficient documentation

## 2018-03-18 NOTE — Progress Notes (Signed)
Primary Care Physician: [name], MD Referring Physician: Dr. Tim Lair is a 67 y.o. male with a h/o paroxsymal afib that is in the afib clijc for f/u after second ablation one month ago. He has not noted any afib. No swallowing or groin issues.  Today, he denies symptoms of palpitations, chest pain, shortness of breath, orthopnea, PND, lower extremity edema, dizziness, presyncope, syncope, or neurologic sequela. The patient is tolerating medications without difficulties and is otherwise without complaint today.   Past Medical History:  Diagnosis Date  . Allergy to alpha-gal   . Hypertension   . Impaired fasting glucose   . Persistent atrial fibrillation 07/10/2012   cardioversion x 2  . Pneumonia    8th grade, without recurrence  . Ureteral stone    Past Surgical History:  Procedure Laterality Date  . ATRIAL FIBRILLATION ABLATION  04/19/2014  . ATRIAL FIBRILLATION ABLATION N/A 04/19/2014   Procedure: ATRIAL FIBRILLATION ABLATION;  Surgeon: [name], MD;  Location: [facility] CATH LAB;  Service: Cardiovascular;  Laterality: N/A;  . ATRIAL FIBRILLATION ABLATION N/A 02/15/2018   Procedure: ATRIAL FIBRILLATION ABLATION;  Surgeon: [name], MD;  Location: [place] CV LAB;  Service: Cardiovascular;  Laterality: N/A;  . CARDIOVERSION N/A 07/14/2012   Procedure: CARDIOVERSION;  Surgeon: [name], MD;  Location: [place];  Service: Cardiovascular;  Laterality: N/A;  . CARDIOVERSION N/A 09/16/2012   Procedure: CARDIOVERSION;  Surgeon: [name], MD;  Location: [facility] ENDOSCOPY;  Service: Cardiovascular;  Laterality: N/A;  . CARDIOVERSION N/A 03/14/2014   Procedure: CARDIOVERSION;  Surgeon: [name], MD;  Location: [facility] ENDOSCOPY;  Service: Cardiovascular;  Laterality: N/A;  . CARDIOVERSION N/A 04/02/2017   Procedure: CARDIOVERSION;  Surgeon: [name], MD;  Location: [facility] ENDOSCOPY;  Service: Cardiovascular;  Laterality: N/A;  . CARDIOVERSION N/A 12/21/2017   Procedure: CARDIOVERSION;  Surgeon: [name], MD;  Location: [facility] ENDOSCOPY;  Service: Cardiovascular;  Laterality: N/A;  . COLONOSCOPY WITH PROPOFOL N/A 01/31/2013   Procedure: COLONOSCOPY WITH PROPOFOL;  Surgeon: [name], MD;  Location: [facility] ENDOSCOPY;  Service: Endoscopy;  Laterality: N/A;  . FRACTURE SURGERY    . ORIF METACARPAL FRACTURE Right   . SHOULDER ARTHROSCOPY W/ ROTATOR CUFF REPAIR Bilateral   . TEE WITHOUT CARDIOVERSION N/A 07/14/2012   Procedure: TRANSESOPHAGEAL ECHOCARDIOGRAM (TEE);  Surgeon: [name], MD;  Location: [facility] ENDOSCOPY;  Service: Cardiovascular;  Laterality: N/A;  . TEE WITHOUT CARDIOVERSION N/A 09/16/2012   Procedure: TRANSESOPHAGEAL ECHOCARDIOGRAM (TEE);  Surgeon: [name], MD;  Location: [facility] ENDOSCOPY;  Service: Cardiovascular;  Laterality: N/A;  . TEE WITHOUT CARDIOVERSION N/A 04/18/2014   Procedure: TRANSESOPHAGEAL ECHOCARDIOGRAM (TEE);  Surgeon: [name], MD;  Location: [facility] ENDOSCOPY;  Service: Cardiovascular;  Laterality: N/A;  . TEE WITHOUT CARDIOVERSION N/A 04/02/2017   Procedure: TRANSESOPHAGEAL ECHOCARDIOGRAM (TEE);  Surgeon: [name], MD;  Location: [facility] ENDOSCOPY;  Service: Cardiovascular;  Laterality: N/A;    Current Outpatient Medications  Medication Sig Dispense Refill  . amLODipine (NORVASC) 5 MG tablet Take 1 tablet (5 mg total) by mouth daily. 90 tablet 3  . Ascorbic Acid (VITAMIN C) 1000 MG tablet Take 1,000 mg by mouth daily. (0800)    . DILT-XR 240 MG 24 hr capsule Take 240 mg by mouth daily.  3  . ELIQUIS 5 MG TABS tablet TAKE 1 TABLET BY MOUTH TWICE A DAY (Patient taking differently: Take 5 mg by mouth 2 (two) times daily. ) 60 tablet 9  . fluticasone (FLONASE)  50 MCG/ACT nasal spray Place 1 spray into both nostrils daily as needed for allergies.    . Misc Natural Products (GLUCOSAMINE CHONDROITIN TRIPLE) TABS Take 1 tablet by mouth daily.    . Multiple Vitamin (MULTIVITAMIN WITH MINERALS) TABS tablet Take 1 tablet by mouth  daily. (0800)    . azelastine (OPTIVAR) 0.05 % ophthalmic solution Place 1 drop into both eyes 2 (two) times daily as needed (for eye irritation).    Marland Kitchen diltiazem (CARDIZEM CD) 120 MG 24 hr capsule TAKE 1 CAPSULE (120 MG TOTAL) BY MOUTH 2 (TWO) TIMES DAILY AS NEEDED. (Patient not taking: Reported on 03/18/2018) 180 capsule 3   No current facility-administered medications for this encounter.     Allergies  Allergen Reactions  . Losartan Swelling    Angioedema  . 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 Lisinopril Swelling    angioedema    Social History   Socioeconomic History  . Marital status: Married    Spouse name: Not on file  . Number of children: Not on file  . Years of education: Not on file  . Highest education level: Not on file  Occupational History  . Not on file  Social Needs  . Financial resource strain: Not on file  . Food insecurity:    Worry: Not on file    Inability: Not on file  . Transportation needs:    Medical: Not on file    Non-medical: Not on file  Tobacco Use  . Smoking status: Never Smoker  . Smokeless tobacco: Never Used  Substance and Sexual Activity  . Alcohol use: Yes    Alcohol/week: 14.0 standard drinks    Types: 7 Glasses of wine, 7 Shots of liquor per week    Comment: 04/19/2014 "2-3 glasses of wine or liquor per day"  . Drug use: No  . Sexual activity: Not Currently  Lifestyle  . Physical activity:    Days per week: Not on file    Minutes per session: Not on file  . Stress: Not on file  Relationships  . Social connections:    Talks on phone: Not on file    Gets together: Not on file    Attends religious service: Not on file    Active member of club or organization: Not on file    Attends meetings of clubs or organizations: Not on file    Relationship status: Not on file  . Intimate partner violence:    Fear of current or ex partner: Not on file    Emotionally abused: Not on file    Physically abused: Not on file     Forced sexual activity: Not on file  Other Topics Concern  . Not on file  Social History Narrative   Lives in [place] with spouse.  2 children (ages 90 and 40).   Owns [employer]    Family History  Problem Relation Age of Onset  . Hypertension Mother     ROS- All systems are reviewed and negative except as per the HPI above  Physical Exam: Vitals:   03/18/18 0948  BP: (!) 142/86  Pulse: 78  Weight: 106.1 kg  Height: 6\' 2"  (1.88 m)   Wt Readings from Last 3 Encounters:  03/18/18 106.1 kg  02/15/18 104 kg  01/19/18 106.7 kg    Labs: Lab Results  Component Value Date   NA 141 01/19/2018   K 4.5 01/19/2018   CL 102 01/19/2018  CO2 24 01/19/2018   GLUCOSE 106 (H) 01/19/2018   BUN 11 01/19/2018   CREATININE 0.72 (L) 01/19/2018   CALCIUM 9.5 01/19/2018   No results found for: INR No results found for: CHOL, HDL, LDLCALC, TRIG   GEN- The patient is well appearing, alert and oriented x 3 today.   Head- normocephalic, atraumatic Eyes-  Sclera clear, conjunctiva pink Ears- hearing intact Oropharynx- clear Neck- supple, no JVP Lymph- no cervical lymphadenopathy Lungs- Clear to ausculation bilaterally, normal work of breathing Heart- Regular rate and rhythm, no murmurs, rubs or gallops, PMI not laterally displaced GI- soft, NT, ND, + BS Extremities- no clubbing, cyanosis, or edema MS- no significant deformity or atrophy Skin- no rash or lesion Psych- euthymic mood, full affect Neuro- strength and sensation are intact  EKG-NSR at 192 bpm, pr int 192 ms, qrs int 96 ms, qtc 414 ms Epic records reviewed    Assessment and Plan: 1. Afib Doing well s/p second ablation maintaining SR No complaints today, feels well Continue diltiazem at 240 mg daily    2. CHA2DS2VASc score of at least 2 Continue eliquis 5 mg bid  Knows not to interrupt anticoagulation  3. HTN Stable  F/u with Dr. [name] 05/23/18 afib clinic as needed  Butch Penny C. Carroll,  [facility] [address] [place], [place] [address] [phone]

## 2018-04-20 ENCOUNTER — Encounter: Payer: Self-pay | Admitting: Cardiology

## 2018-04-20 ENCOUNTER — Ambulatory Visit: Payer: 59 | Admitting: Cardiology

## 2018-04-20 VITALS — BP 132/80 | HR 79 | Ht 74.0 in | Wt 235.8 lb

## 2018-04-20 DIAGNOSIS — Z87898 Personal history of other specified conditions: Secondary | ICD-10-CM

## 2018-04-20 DIAGNOSIS — I480 Paroxysmal atrial fibrillation: Secondary | ICD-10-CM | POA: Diagnosis not present

## 2018-04-20 DIAGNOSIS — I10 Essential (primary) hypertension: Secondary | ICD-10-CM | POA: Diagnosis not present

## 2018-04-20 DIAGNOSIS — Z7901 Long term (current) use of anticoagulants: Secondary | ICD-10-CM

## 2018-04-20 NOTE — Progress Notes (Signed)
Cardiology Office Note:    Date:  04/20/2018   ID:  Reginald [name], DOB 05-17-1950, MRN [id number]  PCP:  [name], MD  Cardiologist:  [name], MD  Electrophysiologist:  None   Referring MD: [name], MD    History of Present Illness:    Reginald [name] is a 68 y.o. male here for follow-up of atrial fibrillation  Had his second ablation in 03/02/2018.  Has not noticed any atrial fibrillation since. No palps, no sob. No CP. All is well. Treadmill daily 30 min.   Prior to second ablation tried cardioversion in September 2019.  First ablation took place on 04/19/2014.  In April 2015 atrial fibrillation began, Multaq utilized.  Continued with fib however.  Noted episode when playing golf.  2018.  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  Past Medical History:  Diagnosis Date  . Allergy to alpha-gal   . Hypertension   . Impaired fasting glucose   . Persistent atrial fibrillation 07/10/2012   cardioversion x 2  . Pneumonia    8th grade, without recurrence  . Ureteral stone     Past Surgical History:  Procedure Laterality Date  . ATRIAL FIBRILLATION ABLATION  04/19/2014  . ATRIAL FIBRILLATION ABLATION N/A 04/19/2014   Procedure: ATRIAL FIBRILLATION ABLATION;  Surgeon: [name], MD;  Location: [facility] CATH LAB;  Service: Cardiovascular;  Laterality: N/A;  . ATRIAL FIBRILLATION ABLATION N/A 02/15/2018   Procedure: ATRIAL FIBRILLATION ABLATION;  Surgeon: [name], MD;  Location: [place] CV LAB;  Service: Cardiovascular;  Laterality: N/A;  . CARDIOVERSION N/A 07/14/2012   Procedure: CARDIOVERSION;  Surgeon: [name], MD;  Location: [place];  Service: Cardiovascular;  Laterality: N/A;  . CARDIOVERSION N/A 09/16/2012   Procedure: CARDIOVERSION;  Surgeon: [name], MD;  Location: [facility] ENDOSCOPY;  Service: Cardiovascular;  Laterality: N/A;  . CARDIOVERSION N/A 03/14/2014   Procedure: CARDIOVERSION;  Surgeon: [name], MD;  Location: [facility] ENDOSCOPY;  Service:  Cardiovascular;  Laterality: N/A;  . CARDIOVERSION N/A 04/02/2017   Procedure: CARDIOVERSION;  Surgeon: [name], MD;  Location: [facility] ENDOSCOPY;  Service: Cardiovascular;  Laterality: N/A;  . CARDIOVERSION N/A 12/21/2017   Procedure: CARDIOVERSION;  Surgeon: [name], MD;  Location: [facility] ENDOSCOPY;  Service: Cardiovascular;  Laterality: N/A;  . COLONOSCOPY WITH PROPOFOL N/A 01/31/2013   Procedure: COLONOSCOPY WITH PROPOFOL;  Surgeon: [name], MD;  Location: [facility] ENDOSCOPY;  Service: Endoscopy;  Laterality: N/A;  . FRACTURE SURGERY    . ORIF METACARPAL FRACTURE Right   . SHOULDER ARTHROSCOPY W/ ROTATOR CUFF REPAIR Bilateral   . TEE WITHOUT CARDIOVERSION N/A 07/14/2012   Procedure: TRANSESOPHAGEAL ECHOCARDIOGRAM (TEE);  Surgeon: [name], MD;  Location: [facility] ENDOSCOPY;  Service: Cardiovascular;  Laterality: N/A;  . TEE WITHOUT CARDIOVERSION N/A 09/16/2012   Procedure: TRANSESOPHAGEAL ECHOCARDIOGRAM (TEE);  Surgeon: [name], MD;  Location: [facility] ENDOSCOPY;  Service: Cardiovascular;  Laterality: N/A;  . TEE WITHOUT CARDIOVERSION N/A 04/18/2014   Procedure: TRANSESOPHAGEAL ECHOCARDIOGRAM (TEE);  Surgeon: [name], MD;  Location: [facility] ENDOSCOPY;  Service: Cardiovascular;  Laterality: N/A;  . TEE WITHOUT CARDIOVERSION N/A 04/02/2017   Procedure: TRANSESOPHAGEAL ECHOCARDIOGRAM (TEE);  Surgeon: [name], MD;  Location: [facility] ENDOSCOPY;  Service: Cardiovascular;  Laterality: N/A;    Current Medications: Current Meds  Medication Sig  . amLODipine (NORVASC) 5 MG tablet Take 1 tablet (5 mg total) by mouth daily.  . Ascorbic Acid (VITAMIN C) 1000 MG tablet Take 1,000 mg by mouth daily. (0800)  .  azelastine (OPTIVAR) 0.05 % ophthalmic solution Place 1 drop into both eyes 2 (two) times daily as needed (for eye irritation).  Marland Kitchen DILT-XR 240 MG 24 hr capsule Take 240 mg by mouth daily.  Marland Kitchen diltiazem (CARDIZEM CD) 120 MG 24 hr capsule TAKE 1 CAPSULE (120 MG TOTAL) BY MOUTH 2 (TWO) TIMES DAILY AS  NEEDED.  Marland Kitchen ELIQUIS 5 MG TABS tablet TAKE 1 TABLET BY MOUTH TWICE A DAY  . fluticasone (FLONASE) 50 MCG/ACT nasal spray Place 1 spray into both nostrils daily as needed for allergies.  . Misc Natural Products (GLUCOSAMINE CHONDROITIN TRIPLE) TABS Take 750 mg by mouth daily.   . Multiple Vitamin (MULTIVITAMIN WITH MINERALS) TABS tablet Take 1 tablet by mouth daily. (0800)     Allergies:   Losartan; Food; and Lisinopril   Social History   Socioeconomic History  . Marital status: Married    Spouse name: Not on file  . Number of children: Not on file  . Years of education: Not on file  . Highest education level: Not on file  Occupational History  . Not on file  Social Needs  . Financial resource strain: Not on file  . Food insecurity:    Worry: Not on file    Inability: Not on file  . Transportation needs:    Medical: Not on file    Non-medical: Not on file  Tobacco Use  . Smoking status: Never Smoker  . Smokeless tobacco: Never Used  Substance and Sexual Activity  . Alcohol use: Yes    Alcohol/week: 14.0 standard drinks    Types: 7 Glasses of wine, 7 Shots of liquor per week    Comment: 04/19/2014 "2-3 glasses of wine or liquor per day"  . Drug use: No  . Sexual activity: Not Currently  Lifestyle  . Physical activity:    Days per week: Not on file    Minutes per session: Not on file  . Stress: Not on file  Relationships  . Social connections:    Talks on phone: Not on file    Gets together: Not on file    Attends religious service: Not on file    Active member of club or organization: Not on file    Attends meetings of clubs or organizations: Not on file    Relationship status: Not on file  Other Topics Concern  . Not on file  Social History Narrative   Lives in [place] with spouse.  2 children (ages 20 and 58).   Owns [employer]     Family History: The patient's family history includes Hypertension in his mother.  ROS:   Please see the history  of present illness.    Denies any fevers chills nausea vomiting syncope bleeding all other systems reviewed and are negative.  EKGs/Labs/Other Studies Reviewed:    The following studies were reviewed today: Prior office notes EKG lab work  EKG:  EKG is not ordered today.  Post ablation ECG shows normal sinus rhythm personally reviewed  Recent Labs: 01/19/2018: BUN 11; Creatinine, Ser 0.72; Hemoglobin 15.5; Platelets 207; Potassium 4.5; Sodium 141  Recent Lipid Panel No results found for: CHOL, TRIG, HDL, CHOLHDL, VLDL, LDLCALC, LDLDIRECT  Physical Exam:    VS:  BP 132/80   Pulse 79   Ht 6\' 2"  (1.88 m)   Wt 235 lb 12.8 oz (107 kg)   SpO2 97%   BMI 30.27 kg/m     Wt Readings from Last 3 Encounters:  04/20/18 235  lb 12.8 oz (107 kg)  03/18/18 234 lb (106.1 kg)  02/15/18 229 lb 3.2 oz (104 kg)     GEN:  Well nourished, well developed in no acute distress HEENT: Normal NECK: No JVD; No carotid bruits LYMPHATICS: No lymphadenopathy CARDIAC: RRR, no murmurs, rubs, gallops RESPIRATORY:  Clear to auscultation without rales, wheezing or rhonchi  ABDOMEN: Soft, non-tender, non-distended MUSCULOSKELETAL:  No edema; No deformity  SKIN: Warm and dry NEUROLOGIC:  Alert and oriented x 3 PSYCHIATRIC:  Normal affect   ASSESSMENT:    1. Paroxysmal atrial fibrillation (HCC)   2. Essential hypertension   3. Chronic anticoagulation   4. History of angioedema    PLAN:    In order of problems listed above:  Paroxysmal atrial fibrillation - Second ablation occurred November 2019.  Maintaining sinus rhythm.  No complaints.  Diltiazem 240.  No antiarrhythmic. Has been twice as good prior. [name] is his wife.  -Dr. [name], [name], NP. -Exercise treadmill test 2015 normal.  Normal EF.  Had an elevated right hemidiaphragm in 2016.  Had is a Insurance underwriter. Basic med. Sleep study 05/02/18. Home. [place]. Letter for pilots license.  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 - LDL 70 HDL 38, excellent.  Creatinine 0.7.  Hemoglobin 15.5.  Chronic anticoagulation - CHADSVASc score is 2 -Continuing Eliquis.  Essential hypertension -Stable.  Medications reviewed.  History of angioedema -Avoiding ACE inhibitor's and angiotensin receptor blockers.  Obesity -Continue to encourage weight loss.  Has follow-up with Dr. [name] in February.    Medication Adjustments/Labs and Tests Ordered: Current medicines are reviewed at length with the patient today.  Concerns regarding medicines are outlined above.  No orders of the defined types were placed in this encounter.  No orders of the defined types were placed in this encounter.   Patient Instructions  Medication Instructions:  Your physician recommends that you continue on your current medications as directed. Please refer to the Current Medication list given to you today.  Labwork: None ordered.  Testing/Procedures: None ordered.  Follow-Up: Your physician recommends that you schedule a follow-up appointment in:    One Year with Dr Marlou Porch  Any Other Special Instructions Will Be Listed Below (If Applicable).     If you need a refill on your cardiac medications before your next appointment, please call your pharmacy.     Signed, [name], MD  04/20/2018 9:54 AM    [facility]

## 2018-04-20 NOTE — Patient Instructions (Signed)
Medication Instructions:  Your physician recommends that you continue on your current medications as directed. Please refer to the Current Medication list given to you today.  Labwork: None ordered.  Testing/Procedures: None ordered.  Follow-Up: Your physician recommends that you schedule a follow-up appointment in:   One Year with Dr Skains  Any Other Special Instructions Will Be Listed Below (If Applicable).     If you need a refill on your cardiac medications before your next appointment, please call your pharmacy.  

## 2018-04-29 ENCOUNTER — Other Ambulatory Visit (HOSPITAL_COMMUNITY): Payer: Self-pay | Admitting: Internal Medicine

## 2018-05-23 ENCOUNTER — Ambulatory Visit: Payer: 59 | Admitting: Internal Medicine

## 2018-05-23 ENCOUNTER — Encounter: Payer: Self-pay | Admitting: Internal Medicine

## 2018-05-23 VITALS — BP 128/80 | HR 77 | Ht 72.0 in | Wt 235.2 lb

## 2018-05-23 DIAGNOSIS — I10 Essential (primary) hypertension: Secondary | ICD-10-CM | POA: Diagnosis not present

## 2018-05-23 DIAGNOSIS — R0683 Snoring: Secondary | ICD-10-CM | POA: Diagnosis not present

## 2018-05-23 DIAGNOSIS — I4819 Other persistent atrial fibrillation: Secondary | ICD-10-CM | POA: Diagnosis not present

## 2018-05-23 NOTE — Patient Instructions (Addendum)
Medication Instructions:  Your physician recommends that you continue on your current medications as directed. Please refer to the Current Medication list given to you today.  Labwork: None ordered.  Testing/Procedures: None ordered.  Follow-Up: Your physician wants you to follow-up in: 3 months with Dr. Sherene Sires visit    Any Other Special Instructions Will Be Listed Below (If Applicable).  If you need a refill on your cardiac medications before your next appointment, please call your pharmacy.

## 2018-05-23 NOTE — Progress Notes (Signed)
PCP: [name], MD Primary Cardiologist: Dr Karolee Ohs is a 68 y.o. male who presents today for routine electrophysiology followup.  Since his recent afib ablation, the patient reports doing very well.  he denies procedure related complications and is pleased with the results of the procedure.  Today, he denies symptoms of palpitations, chest pain, shortness of breath,  lower extremity edema, dizziness, presyncope, or syncope.  The patient is otherwise without complaint today.   Past Medical History:  Diagnosis Date  . Allergy to alpha-gal   . Hypertension   . Impaired fasting glucose   . Persistent atrial fibrillation 07/10/2012   cardioversion x 2  . Pneumonia    8th grade, without recurrence  . Ureteral stone    Past Surgical History:  Procedure Laterality Date  . ATRIAL FIBRILLATION ABLATION  04/19/2014  . ATRIAL FIBRILLATION ABLATION N/A 04/19/2014   Procedure: ATRIAL FIBRILLATION ABLATION;  Surgeon: [name], MD;  Location: [facility] CATH LAB;  Service: Cardiovascular;  Laterality: N/A;  . ATRIAL FIBRILLATION ABLATION N/A 02/15/2018   Procedure: ATRIAL FIBRILLATION ABLATION;  Surgeon: [name], MD;  Location: [place] CV LAB;  Service: Cardiovascular;  Laterality: N/A;  . CARDIOVERSION N/A 07/14/2012   Procedure: CARDIOVERSION;  Surgeon: [name], MD;  Location: [place];  Service: Cardiovascular;  Laterality: N/A;  . CARDIOVERSION N/A 09/16/2012   Procedure: CARDIOVERSION;  Surgeon: [name], MD;  Location: [facility] ENDOSCOPY;  Service: Cardiovascular;  Laterality: N/A;  . CARDIOVERSION N/A 03/14/2014   Procedure: CARDIOVERSION;  Surgeon: [name], MD;  Location: [facility] ENDOSCOPY;  Service: Cardiovascular;  Laterality: N/A;  . CARDIOVERSION N/A 04/02/2017   Procedure: CARDIOVERSION;  Surgeon: [name], MD;  Location: [facility] ENDOSCOPY;  Service: Cardiovascular;  Laterality: N/A;  . CARDIOVERSION N/A 12/21/2017   Procedure: CARDIOVERSION;  Surgeon: [name], MD;  Location: [facility] ENDOSCOPY;  Service: Cardiovascular;  Laterality: N/A;  . COLONOSCOPY WITH PROPOFOL N/A 01/31/2013   Procedure: COLONOSCOPY WITH PROPOFOL;  Surgeon: [name], MD;  Location: [facility] ENDOSCOPY;  Service: Endoscopy;  Laterality: N/A;  . FRACTURE SURGERY    . ORIF METACARPAL FRACTURE Right   . SHOULDER ARTHROSCOPY W/ ROTATOR CUFF REPAIR Bilateral   . TEE WITHOUT CARDIOVERSION N/A 07/14/2012   Procedure: TRANSESOPHAGEAL ECHOCARDIOGRAM (TEE);  Surgeon: [name], MD;  Location: [facility] ENDOSCOPY;  Service: Cardiovascular;  Laterality: N/A;  . TEE WITHOUT CARDIOVERSION N/A 09/16/2012   Procedure: TRANSESOPHAGEAL ECHOCARDIOGRAM (TEE);  Surgeon: [name], MD;  Location: [facility] ENDOSCOPY;  Service: Cardiovascular;  Laterality: N/A;  . TEE WITHOUT CARDIOVERSION N/A 04/18/2014   Procedure: TRANSESOPHAGEAL ECHOCARDIOGRAM (TEE);  Surgeon: [name], MD;  Location: [facility] ENDOSCOPY;  Service: Cardiovascular;  Laterality: N/A;  . TEE WITHOUT CARDIOVERSION N/A 04/02/2017   Procedure: TRANSESOPHAGEAL ECHOCARDIOGRAM (TEE);  Surgeon: [name], MD;  Location: [facility] ENDOSCOPY;  Service: Cardiovascular;  Laterality: N/A;    ROS- all systems are personally reviewed and negatives except as per HPI above  Current Outpatient Medications  Medication Sig Dispense Refill  . amLODipine (NORVASC) 5 MG tablet Take 1 tablet (5 mg total) by mouth daily. 90 tablet 3  . Ascorbic Acid (VITAMIN C) 1000 MG tablet Take 1,000 mg by mouth daily. (0800)    . azelastine (OPTIVAR) 0.05 % ophthalmic solution Place 1 drop into both eyes 2 (two) times daily as needed (for eye irritation).    Marland Kitchen DILT-XR 240 MG 24 hr capsule Take 240 mg by mouth daily.  3  .  diltiazem (CARDIZEM CD) 120 MG 24 hr capsule TAKE 1 CAPSULE (120 MG TOTAL) BY MOUTH 2 (TWO) TIMES DAILY AS NEEDED. 180 capsule 3  . ELIQUIS 5 MG TABS tablet TAKE 1 TABLET BY MOUTH TWICE A DAY 60 tablet 2  . fluticasone (FLONASE) 50 MCG/ACT nasal spray Place 1  spray into both nostrils daily as needed for allergies.    . Misc Natural Products (GLUCOSAMINE CHONDROITIN TRIPLE) TABS Take 750 mg by mouth daily.     . Multiple Vitamin (MULTIVITAMIN WITH MINERALS) TABS tablet Take 1 tablet by mouth daily. (0800)     No current facility-administered medications for this visit.     Physical Exam: Vitals:   05/23/18 0909  BP: 128/80  Pulse: 77  SpO2: 96%  Weight: 235 lb 3.2 oz (106.7 kg)  Height: 6' (1.829 m)    GEN- The patient is well appearing, alert and oriented x 3 today.   Head- normocephalic, atraumatic Eyes-  Sclera clear, conjunctiva pink Ears- hearing intact Oropharynx- clear Lungs- Clear to ausculation bilaterally, normal work of breathing Heart- Regular rate and rhythm, no murmurs, rubs or gallops, PMI not laterally displaced GI- soft, NT, ND, + BS Extremities- no clubbing, cyanosis, or edema  EKG tracing ordered today is personally reviewed and shows sinus rhythm  Assessment and Plan:  1. Persistent atrial fibrillation Doing well s/p repeat afib ablation Very pleased with results. chads2vasc score is 2.  Continue eliquis We discussed reduction in diltiazem.  He is not ready to make changes today.  2. HTN Stable No change required today  3. ETOH Avoidance encouraged  4. Mild sleep apnea Managed by Dr Maxwell Caul.  He has been referred by Clair Gulling to [facility] for orthodontic appliance  Return to see me in 3 months  [name] MD, [facility] 05/23/2018 9:40 AM

## 2018-06-06 ENCOUNTER — Telehealth: Payer: Self-pay | Admitting: Internal Medicine

## 2018-06-06 NOTE — Telephone Encounter (Signed)
Ok to proceed with colonocopy

## 2018-06-06 NOTE — Telephone Encounter (Signed)
Pt called because he needs  to schedule colonoscopy.  Pt had an Ablation on  02/15/18 and was told to wait >3 months to schedule colonoscopy. Pt wants to be sure that enough time has passed since Ablation and he is cleared to have a colonoscopy

## 2018-06-07 NOTE — Telephone Encounter (Signed)
Call returned to Reginald [name].  Advised ok with Dr. [name] to go ahead with colonoscopy.

## 2018-06-22 ENCOUNTER — Telehealth: Payer: Self-pay | Admitting: *Deleted

## 2018-06-22 NOTE — Telephone Encounter (Signed)
   [facility] Pre-operative Risk Assessment    Request for surgical clearance:  1. What type of surgery is being performed? COLONOSCOPY/ENDOSCOPY   2. When is this surgery scheduled? 08/02/18   3. What type of clearance is required (medical clearance vs. Pharmacy clearance to hold med vs. Both)? BOTH  4. Are there any medications that need to be held prior to surgery and how long?ELIQUIS   5. Practice name and name of physician performing surgery? [facility]; DR. Alessandra Bevels   6. What is your office phone number [phone]    7.   What is your office fax number [phone]  8.   Anesthesia type (None, local, MAC, general) ? PROPOFOL   Julaine Hua 06/22/2018, 12:34 PM  _________________________________________________________________   (provider comments below)

## 2018-06-22 NOTE — Telephone Encounter (Signed)
Patient with diagnosis of ATRIAL FIBRILLATION on ELIQUIS for anticoagulation.    Procedure: COLONOSCOPY Date of procedure: 08/02/2018  CHADS2-VASc score of 2 (HTN, AGE)  CrCl > 70ML/MIN Platelet count =207  Per office protocol, patient can hold ELIQUIS for 2 days prior to procedure.    Tennille Montelongo Rodriguez-Guzman PharmD, BCPS, [place] [address] [place] [address] 06/22/2018 1:22 PM

## 2018-06-22 NOTE — Telephone Encounter (Signed)
   Primary Cardiologist: [name], MD  Chart reviewed as part of pre-operative protocol coverage. Patient was contacted 06/22/2018 in reference to pre-operative risk assessment for pending surgery as outlined below.  Reginald [name] was last seen on 05/23/18 by Dr. [name] .  Since that day, Reginald [name] has done well with hx of a fib.  He may hold his Eliquis for 2 days prior to procedure and resume post procedure. .  Therefore, based on ACC/AHA guidelines, the patient would be at acceptable risk for the planned procedure without further cardiovascular testing.   I will route this recommendation to the requesting party via Epic fax function and remove from pre-op pool.  Please call with questions.  Cecilie Kicks, NP 06/22/2018, 2:17 PM

## 2018-07-31 ENCOUNTER — Other Ambulatory Visit (HOSPITAL_COMMUNITY): Payer: Self-pay | Admitting: Internal Medicine

## 2018-08-01 NOTE — Telephone Encounter (Signed)
68 yo, 106.7kg, Scr 0.72 on 01/19/18 Last OV 05/23/18 Indication afib

## 2018-08-20 ENCOUNTER — Other Ambulatory Visit: Payer: Self-pay | Admitting: Cardiology

## 2018-08-22 ENCOUNTER — Telehealth: Payer: Self-pay

## 2018-08-22 NOTE — Telephone Encounter (Signed)
Spoke with pt regarding appt on 08/24/18. Pt stated he will check vitals and upload an EKG prior to appt. Pt concerns were address.

## 2018-08-24 ENCOUNTER — Telehealth (INDEPENDENT_AMBULATORY_CARE_PROVIDER_SITE_OTHER): Payer: 59 | Admitting: Internal Medicine

## 2018-08-24 ENCOUNTER — Encounter: Payer: Self-pay | Admitting: Internal Medicine

## 2018-08-24 VITALS — BP 132/73 | HR 61 | Ht 72.0 in | Wt 222.8 lb

## 2018-08-24 DIAGNOSIS — I10 Essential (primary) hypertension: Secondary | ICD-10-CM | POA: Diagnosis not present

## 2018-08-24 DIAGNOSIS — G4733 Obstructive sleep apnea (adult) (pediatric): Secondary | ICD-10-CM | POA: Diagnosis not present

## 2018-08-24 DIAGNOSIS — I4819 Other persistent atrial fibrillation: Secondary | ICD-10-CM | POA: Diagnosis not present

## 2018-08-24 NOTE — Progress Notes (Signed)
Electrophysiology TeleHealth Note   Due to national recommendations of social distancing due to COVID 19, an audio/video telehealth visit is felt to be most appropriate for this patient at this time.  See MyChart message from today for the patient's consent to telehealth for [facility].   Date:  08/24/2018   ID:  Reginald [name], DOB 05-10-1950, MRN [id number]  Location: patient's home  Provider location: [address], [place]  Evaluation Performed: Follow-up visit  PCP:  [name], MD  Cardiologist:  [name], MD Electrophysiologist:  Dr [name]  Chief Complaint:  afib  History of Present Illness:    Reginald [name] is a 68 y.o. male who presents via audio/video conferencing for a telehealth visit today.  Since last being seen in our clinic, the patient reports doing very well.  He has rare "PVCs" but no afib.  Today, he denies symptoms of chest pain, shortness of breath,  lower extremity edema, dizziness, presyncope, or syncope.  The patient is otherwise without complaint today.  The patient denies symptoms of fevers, chills, cough, or new SOB worrisome for COVID 19.  Past Medical History:  Diagnosis Date  . Allergy to alpha-gal   . Hypertension   . Impaired fasting glucose   . Persistent atrial fibrillation 07/10/2012   cardioversion x 2  . Pneumonia    8th grade, without recurrence  . Ureteral stone     Past Surgical History:  Procedure Laterality Date  . ATRIAL FIBRILLATION ABLATION  04/19/2014  . ATRIAL FIBRILLATION ABLATION N/A 04/19/2014   Procedure: ATRIAL FIBRILLATION ABLATION;  Surgeon: [name], MD;  Location: [facility] CATH LAB;  Service: Cardiovascular;  Laterality: N/A;  . ATRIAL FIBRILLATION ABLATION N/A 02/15/2018   Procedure: ATRIAL FIBRILLATION ABLATION;  Surgeon: [name], MD;  Location: [place] CV LAB;  Service: Cardiovascular;  Laterality: N/A;  . CARDIOVERSION N/A 07/14/2012   Procedure: CARDIOVERSION;  Surgeon: [name], MD;  Location: [place];  Service: Cardiovascular;  Laterality: N/A;  . CARDIOVERSION N/A 09/16/2012   Procedure: CARDIOVERSION;  Surgeon: [name], MD;  Location: [facility] ENDOSCOPY;  Service: Cardiovascular;  Laterality: N/A;  . CARDIOVERSION N/A 03/14/2014   Procedure: CARDIOVERSION;  Surgeon: [name], MD;  Location: [facility] ENDOSCOPY;  Service: Cardiovascular;  Laterality: N/A;  . CARDIOVERSION N/A 04/02/2017   Procedure: CARDIOVERSION;  Surgeon: [name], MD;  Location: [facility] ENDOSCOPY;  Service: Cardiovascular;  Laterality: N/A;  . CARDIOVERSION N/A 12/21/2017   Procedure: CARDIOVERSION;  Surgeon: [name], MD;  Location: [facility] ENDOSCOPY;  Service: Cardiovascular;  Laterality: N/A;  . COLONOSCOPY WITH PROPOFOL N/A 01/31/2013   Procedure: COLONOSCOPY WITH PROPOFOL;  Surgeon: [name], MD;  Location: [facility] ENDOSCOPY;  Service: Endoscopy;  Laterality: N/A;  . FRACTURE SURGERY    . ORIF METACARPAL FRACTURE Right   . SHOULDER ARTHROSCOPY W/ ROTATOR CUFF REPAIR Bilateral   . TEE WITHOUT CARDIOVERSION N/A 07/14/2012   Procedure: TRANSESOPHAGEAL ECHOCARDIOGRAM (TEE);  Surgeon: [name], MD;  Location: [facility] ENDOSCOPY;  Service: Cardiovascular;  Laterality: N/A;  . TEE WITHOUT CARDIOVERSION N/A 09/16/2012   Procedure: TRANSESOPHAGEAL ECHOCARDIOGRAM (TEE);  Surgeon: [name], MD;  Location: [facility] ENDOSCOPY;  Service: Cardiovascular;  Laterality: N/A;  . TEE WITHOUT CARDIOVERSION N/A 04/18/2014   Procedure: TRANSESOPHAGEAL ECHOCARDIOGRAM (TEE);  Surgeon: [name], MD;  Location: [facility] ENDOSCOPY;  Service: Cardiovascular;  Laterality: N/A;  . TEE WITHOUT CARDIOVERSION N/A 04/02/2017   Procedure: TRANSESOPHAGEAL ECHOCARDIOGRAM (TEE);  Surgeon: [name],  MD;  Location: [place] ENDOSCOPY;  Service: Cardiovascular;  Laterality: N/A;    Current Outpatient Medications  Medication Sig Dispense Refill  . amLODipine (NORVASC) 5 MG tablet TAKE 1 TABLET BY MOUTH EVERY DAY 90 tablet 2  .  Ascorbic Acid (VITAMIN C) 1000 MG tablet Take 500 mg by mouth daily. (0800)     . azelastine (OPTIVAR) 0.05 % ophthalmic solution Place 1 drop into both eyes 2 (two) times daily as needed (for eye irritation).    Marland Kitchen DILT-XR 240 MG 24 hr capsule Take 240 mg by mouth daily.  3  . diltiazem (CARDIZEM CD) 120 MG 24 hr capsule TAKE 1 CAPSULE (120 MG TOTAL) BY MOUTH 2 (TWO) TIMES DAILY AS NEEDED. 180 capsule 3  . ELIQUIS 5 MG TABS tablet TAKE 1 TABLET BY MOUTH TWICE A DAY 60 tablet 5  . fluticasone (FLONASE) 50 MCG/ACT nasal spray Place 1 spray into both nostrils daily as needed for allergies.    . Misc Natural Products (GLUCOSAMINE CHONDROITIN TRIPLE) TABS Take 750 mg by mouth daily.     . Multiple Vitamin (MULTIVITAMIN WITH MINERALS) TABS tablet Take 1 tablet by mouth daily. (0800)     No current facility-administered medications for this visit.     Allergies:   Losartan; Food; and Lisinopril   Social History:  The patient  reports that he has never smoked. He has never used smokeless tobacco. He reports current alcohol use of about 14.0 standard drinks of alcohol per week. He reports that he does not use drugs.   Family History:  The patient's  family history includes Hypertension in his mother.   ROS:  Please see the history of present illness.   All other systems are personally reviewed and negative.    Exam:    Vital Signs:  BP 132/73   Pulse 61   Ht 6' (1.829 m)   Wt 222 lb 12.8 oz (101.1 kg)   BMI 30.22 kg/m   Well appearing, alert and conversant, regular work of breathing,  good skin color Eyes- anicteric, neuro- grossly intact, skin- no apparent rash or lesions or cyanosis, mouth- oral mucosa is pink   Labs/Other Tests and Data Reviewed:    Recent Labs: 01/19/2018: BUN 11; Creatinine, Ser 0.72; Hemoglobin 15.5; Platelets 207; Potassium 4.5; Sodium 141   Wt Readings from Last 3 Encounters:  08/24/18 222 lb 12.8 oz (101.1 kg)  05/23/18 235 lb 3.2 oz (106.7 kg)  04/20/18 235  lb 12.8 oz (107 kg)     Other studies personally reviewed: Additional studies/ records that were reviewed today include: EP procedure note, my prior notes  Review of the above records today demonstrates: as above Prior radiographs: cardiac CT 02/09/18-  Calcium score is 47th percent   ASSESSMENT & PLAN:    1.  Persistent afib Well controlled post ablation off AAD therapy He has worked diligently with lifestyle change Continue eliquis for chads2vasc score of 2 Consider reducing diltiazem in the future  2. HTN Stable No change required today  3. Mild sleep apnea Using CPAP Also has a dental prosthesis Followed by Drs Maxwell Caul and Ron Parker  4. COVID 19 screen The patient denies symptoms of COVID 19 at this time.  The importance of social distancing was discussed today.  Follow-up:  4 months with me  Current medicines are reviewed at length with the patient today.   The patient does not have concerns regarding his medicines.  The following changes were made today:  none  Labs/  tests ordered today include:  No orders of the defined types were placed in this encounter.  Patient Risk:  after full review of this patients clinical status, I feel that they are at moderate risk at this time.  Today, I have spent 15 minutes with the patient with telehealth technology discussing afib .    Army Fossa, MD  08/24/2018 8:40 AM     [facility] [address] [place] [address] [phone] (office) [phone] (fax)

## 2018-09-29 ENCOUNTER — Encounter (HOSPITAL_COMMUNITY): Payer: Self-pay | Admitting: Physician Assistant

## 2018-09-29 ENCOUNTER — Ambulatory Visit (HOSPITAL_COMMUNITY)
Admission: RE | Admit: 2018-09-29 | Discharge: 2018-09-29 | Disposition: A | Discharge status: Home / Self Care | Payer: 59 | Source: Ambulatory Visit | Attending: Physician Assistant | Admitting: Physician Assistant

## 2018-09-29 ENCOUNTER — Other Ambulatory Visit (HOSPITAL_COMMUNITY): Payer: Self-pay | Admitting: *Deleted

## 2018-09-29 ENCOUNTER — Telehealth: Payer: Self-pay | Admitting: Internal Medicine

## 2018-09-29 ENCOUNTER — Other Ambulatory Visit: Payer: Self-pay

## 2018-09-29 VITALS — BP 120/84 | HR 73 | Ht 72.0 in | Wt 223.4 lb

## 2018-09-29 DIAGNOSIS — I480 Paroxysmal atrial fibrillation: Secondary | ICD-10-CM | POA: Diagnosis present

## 2018-09-29 LAB — BASIC METABOLIC PANEL
Anion gap: 8 (ref 5–15)
BUN: 13 mg/dL (ref 8–23)
CO2: 25 mmol/L (ref 22–32)
Calcium: 9.2 mg/dL (ref 8.9–10.3)
Chloride: 103 mmol/L (ref 98–111)
Creatinine, Ser: 0.87 mg/dL (ref 0.61–1.24)
GFR calc Af Amer: >60 mL/min (ref >60)
GFR calc non Af Amer: >60 mL/min (ref >60)
Glucose, Bld: 95 mg/dL (ref 70–99)
Potassium: 3.8 mmol/L (ref 3.5–5.1)
Sodium: 136 mmol/L (ref 135–145)

## 2018-09-29 LAB — CBC WITH DIFFERENTIAL/PLATELET
Abs Immature Granulocytes: 0.04 10*3/uL (ref 0.00–0.07)
Basophils Absolute: 0.1 10*3/uL (ref 0.0–0.1)
Basophils Relative: 1 %
Eosinophils Absolute: 0.2 10*3/uL (ref 0.0–0.5)
Eosinophils Relative: 3 %
HCT: 45.4 % (ref 39.0–52.0)
Hemoglobin: 15 g/dL (ref 13.0–17.0)
Immature Granulocytes: 1 %
Lymphocytes Relative: 25 %
Lymphs Abs: 2.1 10*3/uL (ref 0.7–4.0)
MCH: 28.2 pg (ref 26.0–34.0)
MCHC: 33 g/dL (ref 30.0–36.0)
MCV: 85.5 fL (ref 80.0–100.0)
Monocytes Absolute: 0.6 10*3/uL (ref 0.1–1.0)
Monocytes Relative: 7 %
Neutro Abs: 5.6 10*3/uL (ref 1.7–7.7)
Neutrophils Relative %: 63 %
Platelets: 210 10*3/uL (ref 150–400)
RBC: 5.31 MIL/uL (ref 4.22–5.81)
RDW: 13.9 % (ref 11.5–15.5)
WBC: 8.7 10*3/uL (ref 4.0–10.5)
nRBC: 0 % (ref 0.0–0.2)

## 2018-09-29 MED ORDER — DILTIAZEM HCL ER COATED BEADS 120 MG PO CP24: 120.0000 mg | ORAL_CAPSULE | Freq: Every day | ORAL | 3 refills | Status: AC | PRN

## 2018-09-29 NOTE — Progress Notes (Signed)
Primary Care Physician: [name], MD Referring Physician: Dr. Tim Lair is a 68 y.o. male with a h/o paroxsymal afib, OSA, and HTN who presents to the AF clinic for follow up. Patient reports that he has done well since his last visit until this past week after going golfing he noticed palpitations which have been persistent. He is in rate controlled afib today. He has done well with aggressive lifestyle changes including exercise, weight loss, CPAP use, and alcohol avoidance.   Today, he denies symptoms of chest pain, shortness of breath, orthopnea, PND, lower extremity edema, dizziness, presyncope, syncope, or neurologic sequela. The patient is tolerating medications without difficulties and is otherwise without complaint today.   Past Medical History:  Diagnosis Date  . Allergy to alpha-gal   . Hypertension   . Impaired fasting glucose   . Persistent atrial fibrillation 07/10/2012   cardioversion x 2  . Pneumonia    8th grade, without recurrence  . Ureteral stone    Past Surgical History:  Procedure Laterality Date  . ATRIAL FIBRILLATION ABLATION  04/19/2014  . ATRIAL FIBRILLATION ABLATION N/A 04/19/2014   Procedure: ATRIAL FIBRILLATION ABLATION;  Surgeon: [name], MD;  Location: [facility] CATH LAB;  Service: Cardiovascular;  Laterality: N/A;  . ATRIAL FIBRILLATION ABLATION N/A 02/15/2018   Procedure: ATRIAL FIBRILLATION ABLATION;  Surgeon: [name], MD;  Location: [place] CV LAB;  Service: Cardiovascular;  Laterality: N/A;  . CARDIOVERSION N/A 07/14/2012   Procedure: CARDIOVERSION;  Surgeon: [name], MD;  Location: [place];  Service: Cardiovascular;  Laterality: N/A;  . CARDIOVERSION N/A 09/16/2012   Procedure: CARDIOVERSION;  Surgeon: [name], MD;  Location: [facility] ENDOSCOPY;  Service: Cardiovascular;  Laterality: N/A;  . CARDIOVERSION N/A 03/14/2014   Procedure: CARDIOVERSION;  Surgeon: [name], MD;  Location: [facility] ENDOSCOPY;  Service:  Cardiovascular;  Laterality: N/A;  . CARDIOVERSION N/A 04/02/2017   Procedure: CARDIOVERSION;  Surgeon: [name], MD;  Location: [facility] ENDOSCOPY;  Service: Cardiovascular;  Laterality: N/A;  . CARDIOVERSION N/A 12/21/2017   Procedure: CARDIOVERSION;  Surgeon: [name], MD;  Location: [facility] ENDOSCOPY;  Service: Cardiovascular;  Laterality: N/A;  . COLONOSCOPY WITH PROPOFOL N/A 01/31/2013   Procedure: COLONOSCOPY WITH PROPOFOL;  Surgeon: [name], MD;  Location: [facility] ENDOSCOPY;  Service: Endoscopy;  Laterality: N/A;  . FRACTURE SURGERY    . ORIF METACARPAL FRACTURE Right   . SHOULDER ARTHROSCOPY W/ ROTATOR CUFF REPAIR Bilateral   . TEE WITHOUT CARDIOVERSION N/A 07/14/2012   Procedure: TRANSESOPHAGEAL ECHOCARDIOGRAM (TEE);  Surgeon: [name], MD;  Location: [facility] ENDOSCOPY;  Service: Cardiovascular;  Laterality: N/A;  . TEE WITHOUT CARDIOVERSION N/A 09/16/2012   Procedure: TRANSESOPHAGEAL ECHOCARDIOGRAM (TEE);  Surgeon: [name], MD;  Location: [facility] ENDOSCOPY;  Service: Cardiovascular;  Laterality: N/A;  . TEE WITHOUT CARDIOVERSION N/A 04/18/2014   Procedure: TRANSESOPHAGEAL ECHOCARDIOGRAM (TEE);  Surgeon: [name], MD;  Location: [facility] ENDOSCOPY;  Service: Cardiovascular;  Laterality: N/A;  . TEE WITHOUT CARDIOVERSION N/A 04/02/2017   Procedure: TRANSESOPHAGEAL ECHOCARDIOGRAM (TEE);  Surgeon: [name], MD;  Location: [facility] ENDOSCOPY;  Service: Cardiovascular;  Laterality: N/A;    Current Outpatient Medications  Medication Sig Dispense Refill  . amLODipine (NORVASC) 5 MG tablet TAKE 1 TABLET BY MOUTH EVERY DAY 90 tablet 2  . Ascorbic Acid (VITAMIN C) 1000 MG tablet Take 500 mg by mouth daily. (0800)     . azelastine (OPTIVAR) 0.05 % ophthalmic solution Place 1 drop into both eyes 2 (  two) times daily as needed (for eye irritation).    Marland Kitchen DILT-XR 240 MG 24 hr capsule Take 240 mg by mouth daily.  3  . diltiazem (CARDIZEM CD) 120 MG 24 hr capsule TAKE 1 CAPSULE (120 MG TOTAL) BY MOUTH 2  (TWO) TIMES DAILY AS NEEDED. 180 capsule 3  . ELIQUIS 5 MG TABS tablet TAKE 1 TABLET BY MOUTH TWICE A DAY 60 tablet 5  . fluticasone (FLONASE) 50 MCG/ACT nasal spray Place 1 spray into both nostrils daily as needed for allergies.    . Misc Natural Products (GLUCOSAMINE CHONDROITIN TRIPLE) TABS Take 750 mg by mouth daily.     . Multiple Vitamin (MULTIVITAMIN WITH MINERALS) TABS tablet Take 1 tablet by mouth daily. (0800)     No current facility-administered medications for this encounter.     Allergies  Allergen Reactions  . Losartan Swelling    Angioedema  . 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 Lisinopril Swelling    angioedema    Social History   Socioeconomic History  . Marital status: Married    Spouse name: Not on file  . Number of children: Not on file  . Years of education: Not on file  . Highest education level: Not on file  Occupational History  . Not on file  Social Needs  . Financial resource strain: Not on file  . Food insecurity    Worry: Not on file    Inability: Not on file  . Transportation needs    Medical: Not on file    Non-medical: Not on file  Tobacco Use  . Smoking status: Never Smoker  . Smokeless tobacco: Never Used  Substance and Sexual Activity  . Alcohol use: Yes    Alcohol/week: 14.0 standard drinks    Types: 7 Glasses of wine, 7 Shots of liquor per week    Comment: 04/19/2014 "2-3 glasses of wine or liquor per day"  . Drug use: No  . Sexual activity: Not Currently  Lifestyle  . Physical activity    Days per week: Not on file    Minutes per session: Not on file  . Stress: Not on file  Relationships  . Social Herbalist on phone: Not on file    Gets together: Not on file    Attends religious service: Not on file    Active member of club or organization: Not on file    Attends meetings of clubs or organizations: Not on file    Relationship status: Not on file  . Intimate partner violence    Fear of current or ex  partner: Not on file    Emotionally abused: Not on file    Physically abused: Not on file    Forced sexual activity: Not on file  Other Topics Concern  . Not on file  Social History Narrative   Lives in [place] with spouse.  2 children (ages 21 and 54).   Owns [employer]    Family History  Problem Relation Age of Onset  . Hypertension Mother     ROS- All systems are reviewed and negative except as per the HPI above  Physical Exam: There were no vitals filed for this visit. Wt Readings from Last 3 Encounters:  08/24/18 101.1 kg  05/23/18 106.7 kg  04/20/18 107 kg    Labs: Lab Results  Component Value Date   NA 141 01/19/2018   K 4.5 01/19/2018   CL 102 01/19/2018  CO2 24 01/19/2018   GLUCOSE 106 (H) 01/19/2018   BUN 11 01/19/2018   CREATININE 0.72 (L) 01/19/2018   CALCIUM 9.5 01/19/2018   No results found for: INR No results found for: CHOL, HDL, LDLCALC, TRIG  GEN- The patient is well appearing, alert and oriented x 3 today.   HEENT-head normocephalic, atraumatic, sclera clear, conjunctiva pink, hearing intact, trachea midline. Lungs- Clear to ausculation bilaterally, normal work of breathing Heart- Regular rate and rhythm, no murmurs, rubs or gallops  GI- soft, NT, ND, + BS Extremities- no clubbing, cyanosis, or edema MS- no significant deformity or atrophy Skin- no rash or lesion Psych- euthymic mood, full affect Neuro- strength and sensation are intact   EKG- afib HR 73, QRS 90, QTc 416  Epic records reviewed   Assessment and Plan: 1. Persistent atrial fibrillation S/p second ablation 02/15/18. Patient in rate controlled afib today with symptoms of palpitations.  Will arrange for DCCV. Check Bmet/CBC Continue diltiazem at 240 mg daily Pt may take PRN BB for heart racing.  This patients CHA2DS2-VASc Score and unadjusted Ischemic Stroke Rate (% per year) is equal to 2.2 % stroke rate/year from a score of 2  Above score calculated  as 1 point each if present [CHF, HTN, DM, Vascular=MI/PAD/Aortic Plaque, Age if 65-74, or Male] Above score calculated as 2 points each if present [Age > 75, or Stroke/TIA/TE]   2. OSA Encouraged compliance with CPAP. Followed by Dr Maxwell Caul and Dr Ron Parker.  3. HTN Stable, no changes today.  F/u one week post DCCV.   [facility] [address] [place], [place] [address] [phone]

## 2018-09-29 NOTE — Telephone Encounter (Signed)
Call returned to pt.  Pt to see afib clinic today at 3:45 pm.  Pt indicates understanding.

## 2018-09-29 NOTE — H&P (View-Only) (Signed)
Primary Care Physician: [name], MD Referring Physician: Dr. Tim Lair is a 68 y.o. male with a h/o paroxsymal afib, OSA, and HTN who presents to the AF clinic for follow up. Patient reports that he has done well since his last visit until this past week after going golfing he noticed palpitations which have been persistent. He is in rate controlled afib today. He has done well with aggressive lifestyle changes including exercise, weight loss, CPAP use, and alcohol avoidance.   Today, he denies symptoms of chest pain, shortness of breath, orthopnea, PND, lower extremity edema, dizziness, presyncope, syncope, or neurologic sequela. The patient is tolerating medications without difficulties and is otherwise without complaint today.   Past Medical History:  Diagnosis Date  . Allergy to alpha-gal   . Hypertension   . Impaired fasting glucose   . Persistent atrial fibrillation 07/10/2012   cardioversion x 2  . Pneumonia    8th grade, without recurrence  . Ureteral stone    Past Surgical History:  Procedure Laterality Date  . ATRIAL FIBRILLATION ABLATION  04/19/2014  . ATRIAL FIBRILLATION ABLATION N/A 04/19/2014   Procedure: ATRIAL FIBRILLATION ABLATION;  Surgeon: [name], MD;  Location: [facility] CATH LAB;  Service: Cardiovascular;  Laterality: N/A;  . ATRIAL FIBRILLATION ABLATION N/A 02/15/2018   Procedure: ATRIAL FIBRILLATION ABLATION;  Surgeon: [name], MD;  Location: [place] CV LAB;  Service: Cardiovascular;  Laterality: N/A;  . CARDIOVERSION N/A 07/14/2012   Procedure: CARDIOVERSION;  Surgeon: [name], MD;  Location: [place];  Service: Cardiovascular;  Laterality: N/A;  . CARDIOVERSION N/A 09/16/2012   Procedure: CARDIOVERSION;  Surgeon: [name], MD;  Location: [facility] ENDOSCOPY;  Service: Cardiovascular;  Laterality: N/A;  . CARDIOVERSION N/A 03/14/2014   Procedure: CARDIOVERSION;  Surgeon: [name], MD;  Location: [facility] ENDOSCOPY;  Service:  Cardiovascular;  Laterality: N/A;  . CARDIOVERSION N/A 04/02/2017   Procedure: CARDIOVERSION;  Surgeon: [name], MD;  Location: [facility] ENDOSCOPY;  Service: Cardiovascular;  Laterality: N/A;  . CARDIOVERSION N/A 12/21/2017   Procedure: CARDIOVERSION;  Surgeon: [name], MD;  Location: [facility] ENDOSCOPY;  Service: Cardiovascular;  Laterality: N/A;  . COLONOSCOPY WITH PROPOFOL N/A 01/31/2013   Procedure: COLONOSCOPY WITH PROPOFOL;  Surgeon: [name], MD;  Location: [facility] ENDOSCOPY;  Service: Endoscopy;  Laterality: N/A;  . FRACTURE SURGERY    . ORIF METACARPAL FRACTURE Right   . SHOULDER ARTHROSCOPY W/ ROTATOR CUFF REPAIR Bilateral   . TEE WITHOUT CARDIOVERSION N/A 07/14/2012   Procedure: TRANSESOPHAGEAL ECHOCARDIOGRAM (TEE);  Surgeon: [name], MD;  Location: [facility] ENDOSCOPY;  Service: Cardiovascular;  Laterality: N/A;  . TEE WITHOUT CARDIOVERSION N/A 09/16/2012   Procedure: TRANSESOPHAGEAL ECHOCARDIOGRAM (TEE);  Surgeon: [name], MD;  Location: [facility] ENDOSCOPY;  Service: Cardiovascular;  Laterality: N/A;  . TEE WITHOUT CARDIOVERSION N/A 04/18/2014   Procedure: TRANSESOPHAGEAL ECHOCARDIOGRAM (TEE);  Surgeon: [name], MD;  Location: [facility] ENDOSCOPY;  Service: Cardiovascular;  Laterality: N/A;  . TEE WITHOUT CARDIOVERSION N/A 04/02/2017   Procedure: TRANSESOPHAGEAL ECHOCARDIOGRAM (TEE);  Surgeon: [name], MD;  Location: [facility] ENDOSCOPY;  Service: Cardiovascular;  Laterality: N/A;    Current Outpatient Medications  Medication Sig Dispense Refill  . amLODipine (NORVASC) 5 MG tablet TAKE 1 TABLET BY MOUTH EVERY DAY 90 tablet 2  . Ascorbic Acid (VITAMIN C) 1000 MG tablet Take 500 mg by mouth daily. (0800)     . azelastine (OPTIVAR) 0.05 % ophthalmic solution Place 1 drop into both eyes 2 (  two) times daily as needed (for eye irritation).    Marland Kitchen DILT-XR 240 MG 24 hr capsule Take 240 mg by mouth daily.  3  . diltiazem (CARDIZEM CD) 120 MG 24 hr capsule TAKE 1 CAPSULE (120 MG TOTAL) BY MOUTH 2  (TWO) TIMES DAILY AS NEEDED. 180 capsule 3  . ELIQUIS 5 MG TABS tablet TAKE 1 TABLET BY MOUTH TWICE A DAY 60 tablet 5  . fluticasone (FLONASE) 50 MCG/ACT nasal spray Place 1 spray into both nostrils daily as needed for allergies.    . Misc Natural Products (GLUCOSAMINE CHONDROITIN TRIPLE) TABS Take 750 mg by mouth daily.     . Multiple Vitamin (MULTIVITAMIN WITH MINERALS) TABS tablet Take 1 tablet by mouth daily. (0800)     No current facility-administered medications for this encounter.     Allergies  Allergen Reactions  . Losartan Swelling    Angioedema  . 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 Lisinopril Swelling    angioedema    Social History   Socioeconomic History  . Marital status: Married    Spouse name: Not on file  . Number of children: Not on file  . Years of education: Not on file  . Highest education level: Not on file  Occupational History  . Not on file  Social Needs  . Financial resource strain: Not on file  . Food insecurity    Worry: Not on file    Inability: Not on file  . Transportation needs    Medical: Not on file    Non-medical: Not on file  Tobacco Use  . Smoking status: Never Smoker  . Smokeless tobacco: Never Used  Substance and Sexual Activity  . Alcohol use: Yes    Alcohol/week: 14.0 standard drinks    Types: 7 Glasses of wine, 7 Shots of liquor per week    Comment: 04/19/2014 "2-3 glasses of wine or liquor per day"  . Drug use: No  . Sexual activity: Not Currently  Lifestyle  . Physical activity    Days per week: Not on file    Minutes per session: Not on file  . Stress: Not on file  Relationships  . Social Herbalist on phone: Not on file    Gets together: Not on file    Attends religious service: Not on file    Active member of club or organization: Not on file    Attends meetings of clubs or organizations: Not on file    Relationship status: Not on file  . Intimate partner violence    Fear of current or ex  partner: Not on file    Emotionally abused: Not on file    Physically abused: Not on file    Forced sexual activity: Not on file  Other Topics Concern  . Not on file  Social History Narrative   Lives in [place] with spouse.  2 children (ages 77 and 67).   Owns [employer]    Family History  Problem Relation Age of Onset  . Hypertension Mother     ROS- All systems are reviewed and negative except as per the HPI above  Physical Exam: There were no vitals filed for this visit. Wt Readings from Last 3 Encounters:  08/24/18 101.1 kg  05/23/18 106.7 kg  04/20/18 107 kg    Labs: Lab Results  Component Value Date   NA 141 01/19/2018   K 4.5 01/19/2018   CL 102 01/19/2018  CO2 24 01/19/2018   GLUCOSE 106 (H) 01/19/2018   BUN 11 01/19/2018   CREATININE 0.72 (L) 01/19/2018   CALCIUM 9.5 01/19/2018   No results found for: INR No results found for: CHOL, HDL, LDLCALC, TRIG  GEN- The patient is well appearing, alert and oriented x 3 today.   HEENT-head normocephalic, atraumatic, sclera clear, conjunctiva pink, hearing intact, trachea midline. Lungs- Clear to ausculation bilaterally, normal work of breathing Heart- Regular rate and rhythm, no murmurs, rubs or gallops  GI- soft, NT, ND, + BS Extremities- no clubbing, cyanosis, or edema MS- no significant deformity or atrophy Skin- no rash or lesion Psych- euthymic mood, full affect Neuro- strength and sensation are intact   EKG- afib HR 73, QRS 90, QTc 416  Epic records reviewed   Assessment and Plan: 1. Persistent atrial fibrillation S/p second ablation 02/15/18. Patient in rate controlled afib today with symptoms of palpitations.  Will arrange for DCCV. Check Bmet/CBC Continue diltiazem at 240 mg daily Pt may take PRN BB for heart racing.  This patients CHA2DS2-VASc Score and unadjusted Ischemic Stroke Rate (% per year) is equal to 2.2 % stroke rate/year from a score of 2  Above score calculated  as 1 point each if present [CHF, HTN, DM, Vascular=MI/PAD/Aortic Plaque, Age if 65-74, or Male] Above score calculated as 2 points each if present [Age > 75, or Stroke/TIA/TE]   2. OSA Encouraged compliance with CPAP. Followed by Dr Maxwell Caul and Dr Ron Parker.  3. HTN Stable, no changes today.  F/u one week post DCCV.   [facility] [address] [place], [place] [address] [phone]

## 2018-09-29 NOTE — Patient Instructions (Signed)
Your cardioversion is scheduled for : Wednesday 10/05/2018 at 8:30 Arrive at the [facility] and go to admitting at 7:00 a.m. Do Not eat or drink anything after midnight the night prior to your procedure. Take all your medications with a sip of water prior to arrival. Do NOT miss any doses of your blood thinner. You will NOT be able to drive home after your procedure.  3  You will need to be covid tested prior to this appt, then self isolate for the days leading to the procedure

## 2018-09-29 NOTE — Telephone Encounter (Signed)
Patient c/o Palpitations:  High priority if patient c/o lightheadedness, shortness of breath, or chest pain  1) How long have you had palpitations/irregular HR/ Afib? Are you having the symptoms now? AFIB last night.  About 10:30pm, patient states he is still in it.   2) Are you currently experiencing lightheadedness, SOB or CP? no  3) Do you have a history of afib (atrial fibrillation) or irregular heart rhythm? yes  4) Have you checked your BP or HR? (document readings if available): He states it's 80  5) Are you experiencing any other symptoms? no

## 2018-10-01 ENCOUNTER — Other Ambulatory Visit (HOSPITAL_COMMUNITY)
Admission: RE | Admit: 2018-10-01 | Discharge: 2018-10-01 | Disposition: A | Discharge status: Home / Self Care | Payer: 59 | Source: Ambulatory Visit | Attending: Cardiology | Admitting: Cardiology

## 2018-10-01 DIAGNOSIS — Z1159 Encounter for screening for other viral diseases: Secondary | ICD-10-CM | POA: Insufficient documentation

## 2018-10-02 LAB — SARS CORONAVIRUS 2 (TAT 6-24 HRS): SARS Coronavirus 2: NEGATIVE

## 2018-10-04 NOTE — Progress Notes (Signed)
Spoke with pt who denies and signs and/or symptoms of COVID and has remained in quarantine since his COVID test. Jobe Igo, RN

## 2018-10-05 ENCOUNTER — Ambulatory Visit (HOSPITAL_COMMUNITY)
Admission: RE | Admit: 2018-10-05 | Discharge: 2018-10-05 | Disposition: A | Discharge status: Home / Self Care | Payer: 59 | Attending: Cardiology | Admitting: Cardiology

## 2018-10-05 ENCOUNTER — Other Ambulatory Visit: Payer: Self-pay

## 2018-10-05 ENCOUNTER — Ambulatory Visit (HOSPITAL_COMMUNITY): Payer: 59 | Admitting: Certified Registered"

## 2018-10-05 ENCOUNTER — Encounter (HOSPITAL_COMMUNITY): Payer: Self-pay

## 2018-10-05 ENCOUNTER — Encounter (HOSPITAL_COMMUNITY)
Admission: RE | Disposition: A | Discharge status: Home / Self Care | Payer: Self-pay | Source: Home / Self Care | Attending: Cardiology

## 2018-10-05 DIAGNOSIS — Z8249 Family history of ischemic heart disease and other diseases of the circulatory system: Secondary | ICD-10-CM | POA: Diagnosis not present

## 2018-10-05 DIAGNOSIS — Z79899 Other long term (current) drug therapy: Secondary | ICD-10-CM | POA: Insufficient documentation

## 2018-10-05 DIAGNOSIS — I10 Essential (primary) hypertension: Secondary | ICD-10-CM | POA: Diagnosis not present

## 2018-10-05 DIAGNOSIS — Z888 Allergy status to other drugs, medicaments and biological substances status: Secondary | ICD-10-CM | POA: Insufficient documentation

## 2018-10-05 DIAGNOSIS — G4733 Obstructive sleep apnea (adult) (pediatric): Secondary | ICD-10-CM | POA: Insufficient documentation

## 2018-10-05 DIAGNOSIS — I4819 Other persistent atrial fibrillation: Secondary | ICD-10-CM | POA: Insufficient documentation

## 2018-10-05 DIAGNOSIS — I480 Paroxysmal atrial fibrillation: Secondary | ICD-10-CM | POA: Diagnosis not present

## 2018-10-05 DIAGNOSIS — E119 Type 2 diabetes mellitus without complications: Secondary | ICD-10-CM | POA: Insufficient documentation

## 2018-10-05 DIAGNOSIS — Z7901 Long term (current) use of anticoagulants: Secondary | ICD-10-CM | POA: Diagnosis not present

## 2018-10-05 HISTORY — PX: CARDIOVERSION: SHX1299

## 2018-10-05 SURGERY — CARDIOVERSION
Anesthesia: General

## 2018-10-05 MED ORDER — LIDOCAINE 2% (20 MG/ML) 5 ML SYRINGE
INTRAMUSCULAR | Status: DC | PRN
  Administered 2018-10-05: 100 mg via INTRAVENOUS

## 2018-10-05 MED ORDER — PROPOFOL 10 MG/ML IV BOLUS
INTRAVENOUS | Status: DC | PRN
  Administered 2018-10-05: 80 mg via INTRAVENOUS

## 2018-10-05 MED ORDER — SODIUM CHLORIDE 0.9 % IV SOLN
INTRAVENOUS | Status: AC | PRN
  Administered 2018-10-05: 500 mL via INTRAVENOUS

## 2018-10-05 NOTE — Interval H&P Note (Signed)
History and Physical Interval Note:  10/05/2018 8:07 AM  Reginald [name]  has presented today for surgery, with the diagnosis of AFIB.  The various methods of treatment have been discussed with the patient and family. After consideration of risks, benefits and other options for treatment, the patient has consented to  Procedure(s): CARDIOVERSION (N/A) as a surgical intervention.  The patient's history has been reviewed, patient examined, no change in status, stable for surgery.  I have reviewed the patient's chart and labs.  Questions were answered to the patient's satisfaction.     Merrell Rettinger Harrell Gave

## 2018-10-05 NOTE — Anesthesia Preprocedure Evaluation (Signed)
Anesthesia Evaluation  Patient identified by MRN, date of birth, ID band Patient awake    Reviewed: Allergy & Precautions, NPO status , Patient's Chart, lab work & pertinent test results  History of Anesthesia Complications Negative for: history of anesthetic complications  Airway Mallampati: II  TM Distance: >3 FB Neck ROM: Full    Dental no notable dental hx. (+) Dental Advisory Given   Pulmonary shortness of breath, pneumonia,    Pulmonary exam normal        Cardiovascular hypertension, + dysrhythmias Atrial Fibrillation  Rhythm:Irregular Rate:Normal  Echo 03/2017 - Left ventricle: Systolic function was normal. No evidence of thrombus. - Left atrium: No evidence of thrombus in the atrial cavity or  appendage.  Impressions: - Successful cardioversion. No cardiac source of emboli was indentified.   Neuro/Psych negative neurological ROS     GI/Hepatic negative GI ROS, Neg liver ROS,   Endo/Other  negative endocrine ROS  Renal/GU negative Renal ROS     Musculoskeletal negative musculoskeletal ROS (+)   Abdominal   Peds  Hematology negative hematology ROS (+)   Anesthesia Other Findings   Reproductive/Obstetrics                             Anesthesia Physical  Anesthesia Plan  ASA: III  Anesthesia Plan: General   Post-op Pain Management:    Induction: Intravenous  PONV Risk Score and Plan: 2 and Ondansetron and Dexamethasone  Airway Management Planned: Mask  Additional Equipment:   Intra-op Plan:   Post-operative Plan:   Informed Consent: I have reviewed the patients History and Physical, chart, labs and discussed the procedure including the risks, benefits and alternatives for the proposed anesthesia with the patient or authorized representative who has indicated his/her understanding and acceptance.     Dental advisory given  Plan Discussed with: CRNA,  Anesthesiologist and Surgeon  Anesthesia Plan Comments:         Anesthesia Quick Evaluation

## 2018-10-05 NOTE — CV Procedure (Signed)
Procedure:   DCCV  Indication:  Symptomatic atrial fibrillation  Procedure Note:  The patient signed informed consent.  He has had had therapeutic anticoagulation with apixaban greater than 3 weeks.  Anesthesia was administered by Dr. Lissa Hoard.  Adequate airway was maintained throughout and vital followed per protocol.  He was cardioverted x 1 with 120J of biphasic synchronized energy.  He converted to NSR.  There were no apparent complications.  The patient had normal neuro status and respiratory status post procedure with vitals stable as recorded elsewhere.    Follow up:  He will continue on current medical therapy.    [name], MD PhD 10/05/2018 8:24 AM

## 2018-10-05 NOTE — Transfer of Care (Signed)
Immediate Anesthesia Transfer of Care Note  Patient: Reginald [name]  Procedure(s) Performed: CARDIOVERSION (N/A )  Patient Location: PACU  Anesthesia Type:General  Level of Consciousness: awake, alert , oriented and patient cooperative  Airway & Oxygen Therapy: Patient Spontanous Breathing and Patient connected to face mask oxygen  Post-op Assessment: Report given to RN and Post -op Vital signs reviewed and stable  Post vital signs: Reviewed and stable  Last Vitals:  Vitals Value Taken Time  BP 111/65 10/05/18 0824  Temp    Pulse 59 10/05/18 0825  Resp 29 10/05/18 0825  SpO2 96 % 10/05/18 0825    Last Pain:  Vitals:   10/05/18 0758  TempSrc: Temporal  PainSc: 0-No pain         Complications: No apparent anesthesia complications

## 2018-10-05 NOTE — Anesthesia Postprocedure Evaluation (Signed)
Anesthesia Post Note  Patient: Reginald [name]  Procedure(s) Performed: CARDIOVERSION (N/A )     Patient location during evaluation: PACU Anesthesia Type: General Level of consciousness: sedated and patient cooperative Pain management: pain level controlled Vital Signs Assessment: post-procedure vital signs reviewed and stable Respiratory status: spontaneous breathing Cardiovascular status: stable Anesthetic complications: no    Last Vitals:  Vitals:   10/05/18 0847 10/05/18 0857  BP: 103/64 109/68  Pulse: 60 (!) 56  Resp: (!) 21 17  Temp:    SpO2: 95% 95%    Last Pain:  Vitals:   10/05/18 0857  TempSrc:   PainSc: 0-No pain                 Nolon Nations

## 2018-10-06 ENCOUNTER — Encounter (HOSPITAL_COMMUNITY): Payer: Self-pay | Admitting: Cardiology

## 2018-10-06 NOTE — Progress Notes (Signed)
Attempted, left message  

## 2018-10-12 ENCOUNTER — Ambulatory Visit (HOSPITAL_COMMUNITY): Payer: 59 | Admitting: Physician Assistant

## 2018-10-17 ENCOUNTER — Encounter (HOSPITAL_COMMUNITY): Payer: Self-pay | Admitting: Physician Assistant

## 2018-10-17 ENCOUNTER — Other Ambulatory Visit: Payer: Self-pay

## 2018-10-17 ENCOUNTER — Ambulatory Visit (HOSPITAL_COMMUNITY)
Admission: RE | Admit: 2018-10-17 | Discharge: 2018-10-17 | Disposition: A | Discharge status: Home / Self Care | Payer: 59 | Source: Ambulatory Visit | Attending: Physician Assistant | Admitting: Physician Assistant

## 2018-10-17 VITALS — BP 120/62 | HR 62 | Ht 72.0 in | Wt 224.0 lb

## 2018-10-17 DIAGNOSIS — Z87442 Personal history of urinary calculi: Secondary | ICD-10-CM | POA: Diagnosis not present

## 2018-10-17 DIAGNOSIS — Z79899 Other long term (current) drug therapy: Secondary | ICD-10-CM | POA: Insufficient documentation

## 2018-10-17 DIAGNOSIS — I4819 Other persistent atrial fibrillation: Secondary | ICD-10-CM | POA: Insufficient documentation

## 2018-10-17 DIAGNOSIS — I4891 Unspecified atrial fibrillation: Secondary | ICD-10-CM | POA: Diagnosis present

## 2018-10-17 DIAGNOSIS — G4733 Obstructive sleep apnea (adult) (pediatric): Secondary | ICD-10-CM | POA: Insufficient documentation

## 2018-10-17 DIAGNOSIS — I10 Essential (primary) hypertension: Secondary | ICD-10-CM | POA: Diagnosis not present

## 2018-10-17 DIAGNOSIS — Z7901 Long term (current) use of anticoagulants: Secondary | ICD-10-CM | POA: Diagnosis not present

## 2018-10-17 DIAGNOSIS — Z8249 Family history of ischemic heart disease and other diseases of the circulatory system: Secondary | ICD-10-CM | POA: Insufficient documentation

## 2018-10-17 DIAGNOSIS — Z888 Allergy status to other drugs, medicaments and biological substances status: Secondary | ICD-10-CM | POA: Insufficient documentation

## 2018-10-17 NOTE — Progress Notes (Signed)
Primary Care Physician: [name], MD Referring Physician: Dr. Tim Lair is a 68 y.o. male with a h/o paroxsymal afib, OSA, and HTN who presents to the AF clinic for follow up. Patient is now s/p DCCV 10/05/18 and appears to be maintaining SR. He reports that he has not had any further symptoms of heart racing. Denies any missed doses of anticoagulation.   Today, he denies symptoms of palpitations, chest pain, shortness of breath, orthopnea, PND, lower extremity edema, dizziness, presyncope, syncope, or neurologic sequela. The patient is tolerating medications without difficulties and is otherwise without complaint today.   Past Medical History:  Diagnosis Date  . Allergy to alpha-gal   . Hypertension   . Impaired fasting glucose   . Persistent atrial fibrillation 07/10/2012   cardioversion x 2  . Pneumonia    8th grade, without recurrence  . Ureteral stone    Past Surgical History:  Procedure Laterality Date  . ATRIAL FIBRILLATION ABLATION  04/19/2014  . ATRIAL FIBRILLATION ABLATION N/A 04/19/2014   Procedure: ATRIAL FIBRILLATION ABLATION;  Surgeon: [name], MD;  Location: [facility] CATH LAB;  Service: Cardiovascular;  Laterality: N/A;  . ATRIAL FIBRILLATION ABLATION N/A 02/15/2018   Procedure: ATRIAL FIBRILLATION ABLATION;  Surgeon: [name], MD;  Location: [place] CV LAB;  Service: Cardiovascular;  Laterality: N/A;  . CARDIOVERSION N/A 07/14/2012   Procedure: CARDIOVERSION;  Surgeon: [name], MD;  Location: [place];  Service: Cardiovascular;  Laterality: N/A;  . CARDIOVERSION N/A 09/16/2012   Procedure: CARDIOVERSION;  Surgeon: [name], MD;  Location: [place] ENDOSCOPY;  Service: Cardiovascular;  Laterality: N/A;  . CARDIOVERSION N/A 03/14/2014   Procedure: CARDIOVERSION;  Surgeon: [name], MD;  Location: [facility] ENDOSCOPY;  Service: Cardiovascular;  Laterality: N/A;  . CARDIOVERSION N/A 04/02/2017   Procedure: CARDIOVERSION;  Surgeon: [name],  MD;  Location: [place];  Service: Cardiovascular;  Laterality: N/A;  . CARDIOVERSION N/A 12/21/2017   Procedure: CARDIOVERSION;  Surgeon: [name], MD;  Location: [facility] ENDOSCOPY;  Service: Cardiovascular;  Laterality: N/A;  . CARDIOVERSION N/A 10/05/2018   Procedure: CARDIOVERSION;  Surgeon: [name], MD;  Location: [place];  Service: Cardiovascular;  Laterality: N/A;  . COLONOSCOPY WITH PROPOFOL N/A 01/31/2013   Procedure: COLONOSCOPY WITH PROPOFOL;  Surgeon: [name], MD;  Location: [facility] ENDOSCOPY;  Service: Endoscopy;  Laterality: N/A;  . FRACTURE SURGERY    . ORIF METACARPAL FRACTURE Right   . SHOULDER ARTHROSCOPY W/ ROTATOR CUFF REPAIR Bilateral   . TEE WITHOUT CARDIOVERSION N/A 07/14/2012   Procedure: TRANSESOPHAGEAL ECHOCARDIOGRAM (TEE);  Surgeon: [name], MD;  Location: [facility] ENDOSCOPY;  Service: Cardiovascular;  Laterality: N/A;  . TEE WITHOUT CARDIOVERSION N/A 09/16/2012   Procedure: TRANSESOPHAGEAL ECHOCARDIOGRAM (TEE);  Surgeon: [name], MD;  Location: [facility] ENDOSCOPY;  Service: Cardiovascular;  Laterality: N/A;  . TEE WITHOUT CARDIOVERSION N/A 04/18/2014   Procedure: TRANSESOPHAGEAL ECHOCARDIOGRAM (TEE);  Surgeon: [name], MD;  Location: [facility] ENDOSCOPY;  Service: Cardiovascular;  Laterality: N/A;  . TEE WITHOUT CARDIOVERSION N/A 04/02/2017   Procedure: TRANSESOPHAGEAL ECHOCARDIOGRAM (TEE);  Surgeon: [name], MD;  Location: [facility] ENDOSCOPY;  Service: Cardiovascular;  Laterality: N/A;    Current Outpatient Medications  Medication Sig Dispense Refill  . amLODipine (NORVASC) 5 MG tablet TAKE 1 TABLET BY MOUTH EVERY DAY (Patient taking differently: Take 5 mg by mouth daily. ) 90 tablet 2  . azelastine (OPTIVAR) 0.05 % ophthalmic solution Place 1 drop into both eyes 2 (two) times  daily as needed (for eye irritation).    Marland Kitchen DILT-XR 240 MG 24 hr capsule Take 240 mg by mouth daily.  3  . diphenhydramine-acetaminophen (TYLENOL PM) 25-500 MG TABS tablet  Take 1-2 tablets by mouth at bedtime as needed (sleep.).    Marland Kitchen ELIQUIS 5 MG TABS tablet TAKE 1 TABLET BY MOUTH TWICE A DAY (Patient taking differently: Take 5 mg by mouth 2 (two) times a day. ) 60 tablet 5  . fluticasone (FLONASE) 50 MCG/ACT nasal spray Place 1 spray into both nostrils daily as needed for allergies.    . Glucosamine 750 MG TABS Take 750 mg by mouth daily.    . Multiple Vitamin (MULTIVITAMIN WITH MINERALS) TABS tablet Take 1 tablet by mouth daily.     . vitamin C (ASCORBIC ACID) 500 MG tablet Take 500 mg by mouth daily.    Marland Kitchen diltiazem (CARDIZEM CD) 120 MG 24 hr capsule Take 1 capsule (120 mg total) by mouth daily as needed. (Patient not taking: Reported on 10/17/2018) 180 capsule 3   No current facility-administered medications for this encounter.     Allergies  Allergen Reactions  . Losartan Swelling    Angioedema  . 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 Lisinopril Swelling    angioedema    Social History   Socioeconomic History  . Marital status: Married    Spouse name: Not on file  . Number of children: Not on file  . Years of education: Not on file  . Highest education level: Not on file  Occupational History  . Not on file  Social Needs  . Financial resource strain: Not on file  . Food insecurity    Worry: Not on file    Inability: Not on file  . Transportation needs    Medical: Not on file    Non-medical: Not on file  Tobacco Use  . Smoking status: Never Smoker  . Smokeless tobacco: Never Used  Substance and Sexual Activity  . Alcohol use: Yes    Alcohol/week: 14.0 standard drinks    Types: 7 Glasses of wine, 7 Shots of liquor per week    Comment: 04/19/2014 "2-3 glasses of wine or liquor per day"  . Drug use: No  . Sexual activity: Not Currently  Lifestyle  . Physical activity    Days per week: Not on file    Minutes per session: Not on file  . Stress: Not on file  Relationships  . Social Herbalist on phone: Not on file     Gets together: Not on file    Attends religious service: Not on file    Active member of club or organization: Not on file    Attends meetings of clubs or organizations: Not on file    Relationship status: Not on file  . Intimate partner violence    Fear of current or ex partner: Not on file    Emotionally abused: Not on file    Physically abused: Not on file    Forced sexual activity: Not on file  Other Topics Concern  . Not on file  Social History Narrative   Lives in [place] with spouse.  2 children (ages 74 and 65).   Owns [employer]    Family History  Problem Relation Age of Onset  . Hypertension Mother     ROS- All systems are reviewed and negative except as per the HPI above  Physical Exam: Vitals:  10/17/18 1528  BP: 120/62  Pulse: 62  Weight: 101.6 kg  Height: 6' (1.829 m)   Wt Readings from Last 3 Encounters:  10/17/18 101.6 kg  10/05/18 99.9 kg  09/29/18 101.3 kg    Labs: Lab Results  Component Value Date   NA 136 09/29/2018   K 3.8 09/29/2018   CL 103 09/29/2018   CO2 25 09/29/2018   GLUCOSE 95 09/29/2018   BUN 13 09/29/2018   CREATININE 0.87 09/29/2018   CALCIUM 9.2 09/29/2018   No results found for: INR No results found for: CHOL, HDL, LDLCALC, TRIG   GEN- The patient is well appearing, alert and oriented x 3 today.   HEENT-head normocephalic, atraumatic, sclera clear, conjunctiva pink, hearing intact, trachea midline. Lungs- Clear to ausculation bilaterally, normal work of breathing Heart- Regular rate and rhythm, no murmurs, rubs or gallops  GI- soft, NT, ND, + BS Extremities- no clubbing, cyanosis, or edema MS- no significant deformity or atrophy Skin- no rash or lesion Psych- euthymic mood, full affect Neuro- strength and sensation are intact   EKG- SR HR 62, PR 192, QRS 94, QTc 399  Epic records reviewed   Assessment and Plan: 1. Persistent atrial fibrillation S/p second ablation 02/15/18. S/p DCCV 10/05/18.  Appears to be maintaining SR. General education about afib provided and questions answered. Continue diltiazem at 240 mg daily Pt may take PRN BB for heart racing/palpitations. Continue Eliquis 5 mg BID  This patients CHA2DS2-VASc Score and unadjusted Ischemic Stroke Rate (% per year) is equal to 2.2 % stroke rate/year from a score of 2  Above score calculated as 1 point each if present [CHF, HTN, DM, Vascular=MI/PAD/Aortic Plaque, Age if 65-74, or Male] Above score calculated as 2 points each if present [Age > 75, or Stroke/TIA/TE]   2. OSA Followed by Dr Maxwell Caul and Dr Ron Parker.  3. HTN Stable, no changes today.  F/u with Dr [name] as scheduled.   [facility] [address] [place], [place] [address] [phone]

## 2018-10-24 ENCOUNTER — Other Ambulatory Visit (HOSPITAL_COMMUNITY): Payer: Self-pay | Admitting: *Deleted

## 2018-10-24 MED ORDER — METOPROLOL TARTRATE 25 MG PO TABS: ORAL_TABLET | ORAL | 3 refills | Status: DC

## 2018-10-31 ENCOUNTER — Other Ambulatory Visit (HOSPITAL_COMMUNITY): Payer: Self-pay | Admitting: Physician Assistant

## 2018-12-22 ENCOUNTER — Telehealth: Payer: Self-pay

## 2018-12-22 NOTE — Telephone Encounter (Signed)
Spoke with pt regarding appt on 12/26/18. Pt stated he did not have any questions and will check his vitals before his appt.

## 2018-12-26 ENCOUNTER — Telehealth: Payer: Self-pay

## 2018-12-26 ENCOUNTER — Telehealth (INDEPENDENT_AMBULATORY_CARE_PROVIDER_SITE_OTHER): Payer: 59 | Admitting: Internal Medicine

## 2018-12-26 ENCOUNTER — Encounter: Payer: Self-pay | Admitting: Internal Medicine

## 2018-12-26 VITALS — BP 128/76 | HR 70 | Ht 72.0 in | Wt 219.0 lb

## 2018-12-26 DIAGNOSIS — I480 Paroxysmal atrial fibrillation: Secondary | ICD-10-CM | POA: Diagnosis not present

## 2018-12-26 DIAGNOSIS — I10 Essential (primary) hypertension: Secondary | ICD-10-CM | POA: Diagnosis not present

## 2018-12-26 DIAGNOSIS — G4733 Obstructive sleep apnea (adult) (pediatric): Secondary | ICD-10-CM

## 2018-12-26 MED ORDER — FLECAINIDE ACETATE 150 MG PO TABS: 300.0000 mg | ORAL_TABLET | ORAL | 11 refills | Status: AC | PRN

## 2018-12-26 NOTE — Telephone Encounter (Signed)
Order placed.  F/u scheduled.

## 2018-12-26 NOTE — Progress Notes (Signed)
Electrophysiology TeleHealth Note   Due to national recommendations of social distancing due to COVID 19, an audio/video telehealth visit is felt to be most appropriate for this patient at this time.  See MyChart message from today for the patient's consent to telehealth for [facility].   Date:  12/26/2018   ID:  Reginald [name], DOB November 10, 1950, MRN [id number]  Location: patient's home  Provider location:  [facility]  Evaluation Performed: Follow-up visit  PCP:  [name], MD   Electrophysiologist:  Dr [name]  Chief Complaint:  palpitations  History of Present Illness:    Reginald [name] is a 68 y.o. male who presents via telehealth conferencing today.  Since last being seen in our clinic, the patient reports doing very well.  He did have afib since I saw him last and was cardioverted.  He is unaware of triggers for the event.  He has been diligent with lifestyle modification.  Today, he denies symptoms of palpitations, chest pain, shortness of breath,  lower extremity edema, dizziness, presyncope, or syncope.  The patient is otherwise without complaint today.  The patient denies symptoms of fevers, chills, cough, or new SOB worrisome for COVID 19.  Past Medical History:  Diagnosis Date  . Allergy to alpha-gal   . Hypertension   . Impaired fasting glucose   . Persistent atrial fibrillation 07/10/2012   cardioversion x 2  . Pneumonia    8th grade, without recurrence  . Ureteral stone     Past Surgical History:  Procedure Laterality Date  . ATRIAL FIBRILLATION ABLATION  04/19/2014  . ATRIAL FIBRILLATION ABLATION N/A 04/19/2014   Procedure: ATRIAL FIBRILLATION ABLATION;  Surgeon: [name], MD;  Location: [facility] CATH LAB;  Service: Cardiovascular;  Laterality: N/A;  . ATRIAL FIBRILLATION ABLATION N/A 02/15/2018   Procedure: ATRIAL FIBRILLATION ABLATION;  Surgeon: [name], MD;  Location: [place] CV LAB;  Service: Cardiovascular;  Laterality: N/A;  .  CARDIOVERSION N/A 07/14/2012   Procedure: CARDIOVERSION;  Surgeon: [name], MD;  Location: [place];  Service: Cardiovascular;  Laterality: N/A;  . CARDIOVERSION N/A 09/16/2012   Procedure: CARDIOVERSION;  Surgeon: [name], MD;  Location: [facility] ENDOSCOPY;  Service: Cardiovascular;  Laterality: N/A;  . CARDIOVERSION N/A 03/14/2014   Procedure: CARDIOVERSION;  Surgeon: [name], MD;  Location: [facility] ENDOSCOPY;  Service: Cardiovascular;  Laterality: N/A;  . CARDIOVERSION N/A 04/02/2017   Procedure: CARDIOVERSION;  Surgeon: [name], MD;  Location: [place];  Service: Cardiovascular;  Laterality: N/A;  . CARDIOVERSION N/A 12/21/2017   Procedure: CARDIOVERSION;  Surgeon: [name], MD;  Location: [facility] ENDOSCOPY;  Service: Cardiovascular;  Laterality: N/A;  . CARDIOVERSION N/A 10/05/2018   Procedure: CARDIOVERSION;  Surgeon: [name], MD;  Location: [place];  Service: Cardiovascular;  Laterality: N/A;  . COLONOSCOPY WITH PROPOFOL N/A 01/31/2013   Procedure: COLONOSCOPY WITH PROPOFOL;  Surgeon: [name], MD;  Location: [facility] ENDOSCOPY;  Service: Endoscopy;  Laterality: N/A;  . FRACTURE SURGERY    . ORIF METACARPAL FRACTURE Right   . SHOULDER ARTHROSCOPY W/ ROTATOR CUFF REPAIR Bilateral   . TEE WITHOUT CARDIOVERSION N/A 07/14/2012   Procedure: TRANSESOPHAGEAL ECHOCARDIOGRAM (TEE);  Surgeon: [name], MD;  Location: [facility] ENDOSCOPY;  Service: Cardiovascular;  Laterality: N/A;  . TEE WITHOUT CARDIOVERSION N/A 09/16/2012   Procedure: TRANSESOPHAGEAL ECHOCARDIOGRAM (TEE);  Surgeon: [name], MD;  Location: [facility] ENDOSCOPY;  Service: Cardiovascular;  Laterality: N/A;  . TEE WITHOUT CARDIOVERSION N/A 04/18/2014   Procedure: TRANSESOPHAGEAL  ECHOCARDIOGRAM (TEE);  Surgeon: [name], MD;  Location: [facility] ENDOSCOPY;  Service: Cardiovascular;  Laterality: N/A;  . TEE WITHOUT CARDIOVERSION N/A 04/02/2017   Procedure: TRANSESOPHAGEAL ECHOCARDIOGRAM (TEE);  Surgeon: [name], MD;  Location: [facility] ENDOSCOPY;  Service: Cardiovascular;  Laterality: N/A;    Current Outpatient Medications  Medication Sig Dispense Refill  . amLODipine (NORVASC) 5 MG tablet Take 5 mg by mouth daily.    Marland Kitchen apixaban (ELIQUIS) 5 MG TABS tablet Take 5 mg by mouth 2 (two) times daily.    Marland Kitchen azelastine (OPTIVAR) 0.05 % ophthalmic solution Place 1 drop into both eyes 2 (two) times daily as needed (for eye irritation).    Marland Kitchen DILT-XR 240 MG 24 hr capsule Take 240 mg by mouth daily.  3  . diltiazem (CARDIZEM CD) 120 MG 24 hr capsule Take 1 capsule (120 mg total) by mouth daily as needed. (Patient taking differently: Take 120 mg by mouth daily as needed (PVC's). ) 180 capsule 3  . diphenhydramine-acetaminophen (TYLENOL PM) 25-500 MG TABS tablet Take 1-2 tablets by mouth at bedtime as needed (sleep.).    Marland Kitchen fluticasone (FLONASE) 50 MCG/ACT nasal spray Place 1 spray into both nostrils daily as needed for allergies.    . Glucosamine 750 MG TABS Take 750 mg by mouth daily.    . metoprolol tartrate (LOPRESSOR) 25 MG tablet Take 1 tablet every 8 hours as needed for afib 45 tablet 3  . Multiple Vitamin (MULTIVITAMIN WITH MINERALS) TABS tablet Take 1 tablet by mouth daily.     . vitamin C (ASCORBIC ACID) 500 MG tablet Take 500 mg by mouth daily.     No current facility-administered medications for this visit.     Allergies:   Losartan, Food, and Lisinopril   Social History:  The patient  reports that he has never smoked. He has never used smokeless tobacco. He reports current alcohol use of about 14.0 standard drinks of alcohol per week. He reports that he does not use drugs.   Family History:  The patient's  family history includes Hypertension in his mother.   ROS:  Please see the history of present illness.   All other systems are personally reviewed and negative.    Exam:    Vital Signs:  BP 128/76   Pulse 70   Ht 6' (1.829 m)   Wt 219 lb (99.3 kg)   BMI 29.70 kg/m   Well sounding and  appearing, alert and conversant, regular work of breathing,  good skin color Eyes- anicteric, neuro- grossly intact, skin- no apparent rash or lesions or cyanosis, mouth- oral mucosa is pink  Labs/Other Tests and Data Reviewed:    Recent Labs: 09/29/2018: BUN 13; Creatinine, Ser 0.87; Hemoglobin 15.0; Platelets 210; Potassium 3.8; Sodium 136   Wt Readings from Last 3 Encounters:  12/26/18 219 lb (99.3 kg)  10/17/18 224 lb (101.6 kg)  10/05/18 220 lb 3.2 oz (99.9 kg)    Kardia reviewed today which confirms sinus rhythm  ASSESSMENT & PLAN:    1.  Paroxysmal atrial fibrillation Doing reasonably well  I will given pill in pocket flecianide (300mg  po x 1)  Continue anticoagulation with eliquis  2. HTN Stable No change required today  3. OSA Followed by Drs Maxwell Caul and Ron Parker   Follow-up:  3 months with AF clinic,  6 months with me   Patient Risk:  after full review of this patients clinical status, I feel that they are at moderate risk at this  time.  Today, I have spent 15 minutes with the patient with telehealth technology discussing arrhythmia management .    Army Fossa, MD  12/26/2018 10:51 AM     [facility] [address] [place] [address] [phone] (office) [phone] (fax)

## 2018-12-26 NOTE — Telephone Encounter (Signed)
-----   Message from [name], MD sent at 12/26/2018 11:02 AM EDT ----- Please give him flecainide 300mg  x 1 prn afib events.  Follow-up in AF clinic in 41months I will see in 6 months

## 2019-01-12 ENCOUNTER — Other Ambulatory Visit: Payer: Self-pay

## 2019-01-12 DIAGNOSIS — Z20822 Contact with and (suspected) exposure to covid-19: Secondary | ICD-10-CM

## 2019-01-13 LAB — NOVEL CORONAVIRUS, NAA: SARS-CoV-2, NAA: NOT DETECTED

## 2019-02-04 ENCOUNTER — Other Ambulatory Visit (HOSPITAL_COMMUNITY): Payer: Self-pay | Admitting: Cardiology

## 2019-02-06 NOTE — Telephone Encounter (Signed)
Pt last saw Dr [name] 12/26/18 video visit Covid-19, last labs 09/29/18 Creat 0.87, age 68, weight 99.3kg, based on specified criteria pt is on appropriate dosage of Eliquis 5mg  BID.  Will refill rx.

## 2019-02-14 ENCOUNTER — Other Ambulatory Visit: Payer: Self-pay

## 2019-02-14 DIAGNOSIS — Z20822 Contact with and (suspected) exposure to covid-19: Secondary | ICD-10-CM

## 2019-02-15 LAB — NOVEL CORONAVIRUS, NAA: SARS-CoV-2, NAA: NOT DETECTED

## 2019-03-29 ENCOUNTER — Encounter (HOSPITAL_COMMUNITY): Payer: Self-pay | Admitting: Nurse Practitioner

## 2019-03-29 ENCOUNTER — Ambulatory Visit (HOSPITAL_COMMUNITY): Payer: 59 | Admitting: Nurse Practitioner

## 2019-03-29 ENCOUNTER — Other Ambulatory Visit: Payer: Self-pay

## 2019-03-29 ENCOUNTER — Ambulatory Visit (HOSPITAL_COMMUNITY)
Admission: RE | Admit: 2019-03-29 | Discharge: 2019-03-29 | Disposition: A | Discharge status: Home / Self Care | Payer: 59 | Source: Ambulatory Visit | Attending: Nurse Practitioner | Admitting: Nurse Practitioner

## 2019-03-29 VITALS — BP 140/80 | HR 59 | Ht 72.0 in | Wt 225.8 lb

## 2019-03-29 DIAGNOSIS — Z79899 Other long term (current) drug therapy: Secondary | ICD-10-CM | POA: Insufficient documentation

## 2019-03-29 DIAGNOSIS — D6869 Other thrombophilia: Secondary | ICD-10-CM | POA: Diagnosis not present

## 2019-03-29 DIAGNOSIS — I4891 Unspecified atrial fibrillation: Secondary | ICD-10-CM | POA: Diagnosis not present

## 2019-03-29 DIAGNOSIS — I480 Paroxysmal atrial fibrillation: Secondary | ICD-10-CM

## 2019-03-29 DIAGNOSIS — Z7951 Long term (current) use of inhaled steroids: Secondary | ICD-10-CM | POA: Insufficient documentation

## 2019-03-29 DIAGNOSIS — Z7901 Long term (current) use of anticoagulants: Secondary | ICD-10-CM | POA: Insufficient documentation

## 2019-03-29 DIAGNOSIS — I10 Essential (primary) hypertension: Secondary | ICD-10-CM | POA: Diagnosis not present

## 2019-03-29 NOTE — Progress Notes (Signed)
Primary Care Physician: [name], MD Referring Physician: Dr. Tim Lair is a 68 y.o. male with a h/o paroxsymal afib that is in the afib clinic s/p ablation x 2, last one in 2019, cardioversion spring of 2020, for f/u.  Since then he has not noted any further afib. He has flecainide  to use as pill in pocket but he has not required this. He has lost around 50 lbs over the last couple of years by adjusting his diet. CHA2DS2VASc score of 2.   Today, he denies symptoms of palpitations, chest pain, shortness of breath, orthopnea, PND, lower extremity edema, dizziness, presyncope, syncope, or neurologic sequela. The patient is tolerating medications without difficulties and is otherwise without complaint today.   Past Medical History:  Diagnosis Date  . Allergy to alpha-gal   . Hypertension   . Impaired fasting glucose   . Persistent atrial fibrillation 07/10/2012   cardioversion x 2  . Pneumonia    8th grade, without recurrence  . Ureteral stone    Past Surgical History:  Procedure Laterality Date  . ATRIAL FIBRILLATION ABLATION  04/19/2014  . ATRIAL FIBRILLATION ABLATION N/A 04/19/2014   Procedure: ATRIAL FIBRILLATION ABLATION;  Surgeon: [name], MD;  Location: [facility] CATH LAB;  Service: Cardiovascular;  Laterality: N/A;  . ATRIAL FIBRILLATION ABLATION N/A 02/15/2018   Procedure: ATRIAL FIBRILLATION ABLATION;  Surgeon: [name], MD;  Location: [place] CV LAB;  Service: Cardiovascular;  Laterality: N/A;  . CARDIOVERSION N/A 07/14/2012   Procedure: CARDIOVERSION;  Surgeon: [name], MD;  Location: [place];  Service: Cardiovascular;  Laterality: N/A;  . CARDIOVERSION N/A 09/16/2012   Procedure: CARDIOVERSION;  Surgeon: [name], MD;  Location: [facility] ENDOSCOPY;  Service: Cardiovascular;  Laterality: N/A;  . CARDIOVERSION N/A 03/14/2014   Procedure: CARDIOVERSION;  Surgeon: [name], MD;  Location: [facility] ENDOSCOPY;  Service: Cardiovascular;  Laterality: N/A;    . CARDIOVERSION N/A 04/02/2017   Procedure: CARDIOVERSION;  Surgeon: [name], MD;  Location: [place];  Service: Cardiovascular;  Laterality: N/A;  . CARDIOVERSION N/A 12/21/2017   Procedure: CARDIOVERSION;  Surgeon: [name], MD;  Location: [facility] ENDOSCOPY;  Service: Cardiovascular;  Laterality: N/A;  . CARDIOVERSION N/A 10/05/2018   Procedure: CARDIOVERSION;  Surgeon: [name], MD;  Location: [place];  Service: Cardiovascular;  Laterality: N/A;  . COLONOSCOPY WITH PROPOFOL N/A 01/31/2013   Procedure: COLONOSCOPY WITH PROPOFOL;  Surgeon: [name], MD;  Location: [facility] ENDOSCOPY;  Service: Endoscopy;  Laterality: N/A;  . FRACTURE SURGERY    . ORIF METACARPAL FRACTURE Right   . SHOULDER ARTHROSCOPY W/ ROTATOR CUFF REPAIR Bilateral   . TEE WITHOUT CARDIOVERSION N/A 07/14/2012   Procedure: TRANSESOPHAGEAL ECHOCARDIOGRAM (TEE);  Surgeon: [name], MD;  Location: [facility] ENDOSCOPY;  Service: Cardiovascular;  Laterality: N/A;  . TEE WITHOUT CARDIOVERSION N/A 09/16/2012   Procedure: TRANSESOPHAGEAL ECHOCARDIOGRAM (TEE);  Surgeon: [name], MD;  Location: [facility] ENDOSCOPY;  Service: Cardiovascular;  Laterality: N/A;  . TEE WITHOUT CARDIOVERSION N/A 04/18/2014   Procedure: TRANSESOPHAGEAL ECHOCARDIOGRAM (TEE);  Surgeon: [name], MD;  Location: [facility] ENDOSCOPY;  Service: Cardiovascular;  Laterality: N/A;  . TEE WITHOUT CARDIOVERSION N/A 04/02/2017   Procedure: TRANSESOPHAGEAL ECHOCARDIOGRAM (TEE);  Surgeon: [name], MD;  Location: [facility] ENDOSCOPY;  Service: Cardiovascular;  Laterality: N/A;    Current Outpatient Medications  Medication Sig Dispense Refill  . amLODipine (NORVASC) 5 MG tablet Take 5 mg by mouth daily.    Marland Kitchen azelastine (OPTIVAR) 0.05 %  ophthalmic solution Place 1 drop into both eyes 2 (two) times daily as needed (for eye irritation).    Marland Kitchen DILT-XR 240 MG 24 hr capsule Take 240 mg by mouth daily.  3  . diltiazem (CARDIZEM CD) 120 MG 24 hr capsule Take 1  capsule (120 mg total) by mouth daily as needed. (Patient taking differently: Take 120 mg by mouth daily as needed (PVC's). ) 180 capsule 3  . diphenhydramine-acetaminophen (TYLENOL PM) 25-500 MG TABS tablet Take 1-2 tablets by mouth at bedtime as needed (sleep.).    Marland Kitchen ELIQUIS 5 MG TABS tablet TAKE 1 TABLET BY MOUTH TWICE A DAY 60 tablet 5  . flecainide (TAMBOCOR) 150 MG tablet Take 2 tablets (300 mg total) by mouth as needed (Take 2 tablets as needed once daily for breakthrough afib). (Patient taking differently: Take 300 mg by mouth as needed (Take 2 tablets as needed for breakthrough afib). ) 60 tablet 11  . fluticasone (FLONASE) 50 MCG/ACT nasal spray Place 1 spray into both nostrils daily as needed for allergies.    . Glucosamine 750 MG TABS Take 750 mg by mouth daily.    . metoprolol tartrate (LOPRESSOR) 25 MG tablet Take 1 tablet every 8 hours as needed for afib (Patient taking differently: Take 1 tablet -as needed for afib) 45 tablet 3  . Multiple Vitamin (MULTIVITAMIN WITH MINERALS) TABS tablet Take 1 tablet by mouth daily.     . vitamin C (ASCORBIC ACID) 500 MG tablet Take 500 mg by mouth daily.     No current facility-administered medications for this encounter.    Allergies  Allergen Reactions  . Losartan Swelling    Angioedema  . 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 Lisinopril Swelling    angioedema    Social History   Socioeconomic History  . Marital status: Married    Spouse name: Not on file  . Number of children: Not on file  . Years of education: Not on file  . Highest education level: Not on file  Occupational History  . Not on file  Tobacco Use  . Smoking status: Never Smoker  . Smokeless tobacco: Never Used  Substance and Sexual Activity  . Alcohol use: Yes    Alcohol/week: 14.0 standard drinks    Types: 7 Glasses of wine, 7 Shots of liquor per week    Comment: 04/19/2014 "2-3 glasses of wine or liquor per day"  . Drug use: No  . Sexual activity:  Not Currently  Other Topics Concern  . Not on file  Social History Narrative   Lives in [place] with spouse.  2 children (ages 8 and 72).   Owns [employer]   Social Determinants of Health   Financial Resource Strain:   . Difficulty of Paying Living Expenses: Not on file  Food Insecurity:   . Worried About Charity fundraiser in the Last Year: Not on file  . Ran Out of Food in the Last Year: Not on file  Transportation Needs:   . Lack of Transportation (Medical): Not on file  . Lack of Transportation (Non-Medical): Not on file  Physical Activity:   . Days of Exercise per Week: Not on file  . Minutes of Exercise per Session: Not on file  Stress:   . Feeling of Stress : Not on file  Social Connections:   . Frequency of Communication with Friends and Family: Not on file  . Frequency of Social Gatherings with Friends and  Family: Not on file  . Attends Religious Services: Not on file  . Active Member of Clubs or Organizations: Not on file  . Attends Archivist Meetings: Not on file  . Marital Status: Not on file  Intimate Partner Violence:   . Fear of Current or Ex-Partner: Not on file  . Emotionally Abused: Not on file  . Physically Abused: Not on file  . Sexually Abused: Not on file    Family History  Problem Relation Age of Onset  . Hypertension Mother     ROS- All systems are reviewed and negative except as per the HPI above  Physical Exam: Vitals:   03/29/19 1110  BP: 140/80  Pulse: (!) 59  Weight: 102.4 kg  Height: 6' (1.829 m)   Wt Readings from Last 3 Encounters:  03/29/19 102.4 kg  12/26/18 99.3 kg  10/17/18 101.6 kg    Labs: Lab Results  Component Value Date   NA 136 09/29/2018   K 3.8 09/29/2018   CL 103 09/29/2018   CO2 25 09/29/2018   GLUCOSE 95 09/29/2018   BUN 13 09/29/2018   CREATININE 0.87 09/29/2018   CALCIUM 9.2 09/29/2018   No results found for: INR No results found for: CHOL, HDL, LDLCALC, TRIG   GEN-  The patient is well appearing, alert and oriented x 3 today.   Head- normocephalic, atraumatic Eyes-  Sclera clear, conjunctiva pink Ears- hearing intact Oropharynx- clear Neck- supple, no JVP Lymph- no cervical lymphadenopathy Lungs- Clear to ausculation bilaterally, normal work of breathing Heart- Regular rate and rhythm, no murmurs, rubs or gallops, PMI not laterally displaced GI- soft, NT, ND, + BS Extremities- no clubbing, cyanosis, or edema MS- no significant deformity or atrophy Skin- no rash or lesion Psych- euthymic mood, full affect Neuro- strength and sensation are intact  EKG- Sinus brady at 59 bpm, PR int 200 ms, qrs int 92 ms, qtc 372 ms 414 ms Epic records reviewed    Assessment and Plan: 1. Afib Doing well s/p second ablation, 02/2018,  maintaining SR successful cardioversion 09/2018 No complaints today, feels well, no further afib episodes  Continue diltiazem at 240 mg daily   Has pill in pocket flecainide 150  mg x 2 tabs ( total of 300 mg )  if afib should reoccur   2. CHA2DS2VASc score of at least 2 Continue eliquis 5 mg bid   3. HTN Stable  F/u with Dr. [name]  March 11/2018 afib clinic as needed  Geroge Baseman. Lunden Stieber, [facility] [address] [place], [place] [address] [phone]

## 2019-04-11 ENCOUNTER — Ambulatory Visit: Payer: 59 | Attending: Internal Medicine

## 2019-04-11 DIAGNOSIS — Z20822 Contact with and (suspected) exposure to covid-19: Secondary | ICD-10-CM

## 2019-04-13 LAB — NOVEL CORONAVIRUS, NAA: SARS-CoV-2, NAA: NOT DETECTED

## 2019-04-18 ENCOUNTER — Ambulatory Visit: Payer: 59 | Attending: Internal Medicine

## 2019-04-18 DIAGNOSIS — Z20822 Contact with and (suspected) exposure to covid-19: Secondary | ICD-10-CM

## 2019-04-20 LAB — NOVEL CORONAVIRUS, NAA: SARS-CoV-2, NAA: DETECTED — AB

## 2019-04-26 ENCOUNTER — Ambulatory Visit: Payer: 59 | Admitting: Cardiology

## 2019-04-26 ENCOUNTER — Other Ambulatory Visit: Payer: Self-pay

## 2019-04-26 ENCOUNTER — Encounter: Payer: Self-pay | Admitting: Cardiology

## 2019-04-26 VITALS — BP 124/80 | HR 72 | Ht 72.0 in | Wt 224.0 lb

## 2019-04-26 DIAGNOSIS — I10 Essential (primary) hypertension: Secondary | ICD-10-CM

## 2019-04-26 DIAGNOSIS — Z7901 Long term (current) use of anticoagulants: Secondary | ICD-10-CM | POA: Diagnosis not present

## 2019-04-26 DIAGNOSIS — G4733 Obstructive sleep apnea (adult) (pediatric): Secondary | ICD-10-CM | POA: Diagnosis not present

## 2019-04-26 DIAGNOSIS — I480 Paroxysmal atrial fibrillation: Secondary | ICD-10-CM | POA: Diagnosis not present

## 2019-04-26 NOTE — Progress Notes (Signed)
Cardiology Office Note:    Date:  04/26/2019   ID:  Reginald [name], DOB August 04, 1950, MRN [id number]  PCP:  [name], MD  Cardiologist:  [name], MD  Electrophysiologist:  None   Referring MD: [name], MD    History of Present Illness:    Reginald [name] is a 69 y.o. male here for follow-up of atrial fibrillation  Had his second ablation in 03/02/2018.  Has not noticed any atrial fibrillation since. No palps, no sob. No CP. All is well. Treadmill daily 30 min.   Prior to second ablation tried cardioversion in September 2019.  First ablation took place on 04/19/2014.  In April 2015 atrial fibrillation began, Multaq utilized.  Continued with fib however.  Noted episode when playing golf.  2018.  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  04/26/19  - here for afib follow up. COVID + he and wife, mild fatigue. [name] had friend ride with her to [facility] was +. Sinus congestion noted, no mucous. Lost smell.  Cut scalp on airplane. 2 staples.  Overall feeling well currently.  Denies any major palpitations.  Encouraged him not to use Sudafed.  Past Medical History:  Diagnosis Date  . Allergy to alpha-gal   . Hypertension   . Impaired fasting glucose   . Persistent atrial fibrillation ([place]) 07/10/2012   cardioversion x 2  . Pneumonia    8th grade, without recurrence  . Ureteral stone     Past Surgical History:  Procedure Laterality Date  . ATRIAL FIBRILLATION ABLATION  04/19/2014  . ATRIAL FIBRILLATION ABLATION N/A 04/19/2014   Procedure: ATRIAL FIBRILLATION ABLATION;  Surgeon: [name], MD;  Location: [facility] CATH LAB;  Service: Cardiovascular;  Laterality: N/A;  . ATRIAL FIBRILLATION ABLATION N/A 02/15/2018   Procedure: ATRIAL FIBRILLATION ABLATION;  Surgeon: [name], MD;  Location: [place] CV LAB;  Service: Cardiovascular;  Laterality: N/A;  . CARDIOVERSION N/A 07/14/2012   Procedure: CARDIOVERSION;  Surgeon: [name], MD;  Location: [place];  Service:  Cardiovascular;  Laterality: N/A;  . CARDIOVERSION N/A 09/16/2012   Procedure: CARDIOVERSION;  Surgeon: [name], MD;  Location: [facility] ENDOSCOPY;  Service: Cardiovascular;  Laterality: N/A;  . CARDIOVERSION N/A 03/14/2014   Procedure: CARDIOVERSION;  Surgeon: [name], MD;  Location: [facility] ENDOSCOPY;  Service: Cardiovascular;  Laterality: N/A;  . CARDIOVERSION N/A 04/02/2017   Procedure: CARDIOVERSION;  Surgeon: [name], MD;  Location: [place];  Service: Cardiovascular;  Laterality: N/A;  . CARDIOVERSION N/A 12/21/2017   Procedure: CARDIOVERSION;  Surgeon: [name], MD;  Location: [facility] ENDOSCOPY;  Service: Cardiovascular;  Laterality: N/A;  . CARDIOVERSION N/A 10/05/2018   Procedure: CARDIOVERSION;  Surgeon: [name], MD;  Location: [place];  Service: Cardiovascular;  Laterality: N/A;  . COLONOSCOPY WITH PROPOFOL N/A 01/31/2013   Procedure: COLONOSCOPY WITH PROPOFOL;  Surgeon: [name], MD;  Location: [facility] ENDOSCOPY;  Service: Endoscopy;  Laterality: N/A;  . FRACTURE SURGERY    . ORIF METACARPAL FRACTURE Right   . SHOULDER ARTHROSCOPY W/ ROTATOR CUFF REPAIR Bilateral   . TEE WITHOUT CARDIOVERSION N/A 07/14/2012   Procedure: TRANSESOPHAGEAL ECHOCARDIOGRAM (TEE);  Surgeon: [name], MD;  Location: [facility] ENDOSCOPY;  Service: Cardiovascular;  Laterality: N/A;  . TEE WITHOUT CARDIOVERSION N/A 09/16/2012   Procedure: TRANSESOPHAGEAL ECHOCARDIOGRAM (TEE);  Surgeon: [name], MD;  Location: [facility] ENDOSCOPY;  Service: Cardiovascular;  Laterality: N/A;  . TEE WITHOUT CARDIOVERSION N/A 04/18/2014   Procedure: TRANSESOPHAGEAL ECHOCARDIOGRAM (TEE);  Surgeon: [name], MD;  Location: [facility] ENDOSCOPY;  Service: Cardiovascular;  Laterality: N/A;  . TEE WITHOUT CARDIOVERSION N/A 04/02/2017   Procedure: TRANSESOPHAGEAL ECHOCARDIOGRAM (TEE);  Surgeon: [name], MD;  Location: [facility] ENDOSCOPY;  Service: Cardiovascular;  Laterality: N/A;    Current Medications: Current Meds    Medication Sig  . amLODipine (NORVASC) 5 MG tablet Take 5 mg by mouth daily.  Marland Kitchen azelastine (OPTIVAR) 0.05 % ophthalmic solution Place 1 drop into both eyes 2 (two) times daily as needed (for eye irritation).  Marland Kitchen DILT-XR 240 MG 24 hr capsule Take 240 mg by mouth daily.  Marland Kitchen diltiazem (CARDIZEM CD) 120 MG 24 hr capsule Take 1 capsule (120 mg total) by mouth daily as needed.  . diphenhydramine-acetaminophen (TYLENOL PM) 25-500 MG TABS tablet Take 1-2 tablets by mouth at bedtime as needed (sleep.).  Marland Kitchen ELIQUIS 5 MG TABS tablet TAKE 1 TABLET BY MOUTH TWICE A DAY  . flecainide (TAMBOCOR) 150 MG tablet Take 2 tablets (300 mg total) by mouth as needed (Take 2 tablets as needed once daily for breakthrough afib).  . fluticasone (FLONASE) 50 MCG/ACT nasal spray Place 1 spray into both nostrils daily as needed for allergies.  . Glucosamine 750 MG TABS Take 750 mg by mouth daily.  . metoprolol tartrate (LOPRESSOR) 25 MG tablet Take 1 tablet every 8 hours as needed for afib  . Multiple Vitamin (MULTIVITAMIN WITH MINERALS) TABS tablet Take 1 tablet by mouth daily.   . vitamin C (ASCORBIC ACID) 500 MG tablet Take 500 mg by mouth daily.     Allergies:   Losartan, Food, and Lisinopril   Social History   Socioeconomic History  . Marital status: Married    Spouse name: Not on file  . Number of children: Not on file  . Years of education: Not on file  . Highest education level: Not on file  Occupational History  . Not on file  Tobacco Use  . Smoking status: Never Smoker  . Smokeless tobacco: Never Used  Substance and Sexual Activity  . Alcohol use: Yes    Alcohol/week: 14.0 standard drinks    Types: 7 Glasses of wine, 7 Shots of liquor per week    Comment: 04/19/2014 "2-3 glasses of wine or liquor per day"  . Drug use: No  . Sexual activity: Not Currently  Other Topics Concern  . Not on file  Social History Narrative   Lives in [place] with spouse.  2 children (ages 58 and 58).   Owns [employer]   Social Determinants of Health   Financial Resource Strain:   . Difficulty of Paying Living Expenses: Not on file  Food Insecurity:   . Worried About Charity fundraiser in the Last Year: Not on file  . Ran Out of Food in the Last Year: Not on file  Transportation Needs:   . Lack of Transportation (Medical): Not on file  . Lack of Transportation (Non-Medical): Not on file  Physical Activity:   . Days of Exercise per Week: Not on file  . Minutes of Exercise per Session: Not on file  Stress:   . Feeling of Stress : Not on file  Social Connections:   . Frequency of Communication with Friends and Family: Not on file  . Frequency of Social Gatherings with Friends and Family: Not on file  . Attends Religious Services: Not on file  . Active Member of Clubs or Organizations: Not on file  . Attends Archivist Meetings: Not on file  .  Marital Status: Not on file     Family History: The patient's family history includes Hypertension in his mother.  ROS:   Please see the history of present illness.    Denies any fevers chills nausea vomiting syncope bleeding all other systems reviewed and are negative.  EKGs/Labs/Other Studies Reviewed:    The following studies were reviewed today: Prior office notes EKG lab work  EKG:  EKG is not ordered today.  Post ablation ECG shows normal sinus rhythm personally reviewed  Recent Labs: 09/29/2018: BUN 13; Creatinine, Ser 0.87; Hemoglobin 15.0; Platelets 210; Potassium 3.8; Sodium 136  Recent Lipid Panel No results found for: CHOL, TRIG, HDL, CHOLHDL, VLDL, LDLCALC, LDLDIRECT  Physical Exam:    VS:  BP 124/80   Pulse 72   Ht 6' (1.829 m)   Wt 224 lb (101.6 kg)   SpO2 97%   BMI 30.38 kg/m     Wt Readings from Last 3 Encounters:  04/26/19 224 lb (101.6 kg)  03/29/19 225 lb 12.8 oz (102.4 kg)  12/26/18 219 lb (99.3 kg)     GEN:  Well nourished, well developed in no acute distress HEENT: Normal NECK: No  JVD; No carotid bruits LYMPHATICS: No lymphadenopathy CARDIAC: RRR, no murmurs, rubs, gallops RESPIRATORY:  Clear to auscultation without rales, wheezing or rhonchi  ABDOMEN: Soft, non-tender, non-distended MUSCULOSKELETAL:  No edema; No deformity  SKIN: Warm and dry NEUROLOGIC:  Alert and oriented x 3 PSYCHIATRIC:  Normal affect   ASSESSMENT:    1. Paroxysmal atrial fibrillation (HCC)   2. Essential hypertension   3. Obstructive sleep apnea   4. Chronic anticoagulation    PLAN:    In order of problems listed above:  Recent COVID-19 infection January. -Recovering well.  Paroxysmal atrial fibrillation - Second ablation occurred November 2019.  Maintaining sinus rhythm.  No complaints.  Diltiazem 240.  No antiarrhythmic. Has been twice as good prior. [name] is his wife.  -Dr. [name], [name], NP. -Has pill in the pocket flecainide 150 mg x 2 for a total of 300 of A. fib should recur.  Overall has been doing well status post second ablation in November 2019.  Successful cardioversion in June 2020. -Exercise treadmill test 2015 normal.  Normal EF.  Had an elevated right hemidiaphragm in 2016.  Had is a Insurance underwriter. Basic med. Sleep study 05/02/18. Home. [place]. Letter for pilots license.  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  In May he may need a renewal lateral for pilots license.  Chronic anticoagulation - CHADSVASc score is 2 -Continuing Eliquis.  Hemoglobin from outside labs is normal 15.1.  Creatinine 0.9.  Essential hypertension -Stable.  Medications reviewed.  History of angioedema -Avoiding ACE inhibitor's and angiotensin receptor blockers.  Obesity -Continue to encourage weight loss.  OSA  - CPAP good.  Doing well with this.  17 down to 2.  He is wearing sensors at night.  Occasionally pulse will go down into the low  50s sometimes upper 40s.  This is fine.    Medication Adjustments/Labs and Tests Ordered: Current medicines are reviewed at length with the patient today.  Concerns regarding medicines are outlined above.  No orders of the defined types were placed in this encounter.  No orders of the defined types were placed in this encounter.   Patient Instructions  Medication Instructions:  The current medical regimen is effective;  continue present plan and medications.  *If you need a refill on your cardiac medications before your next appointment, please call your pharmacy*  Follow-Up: At [facility], you and your health needs are our priority.  As part of our continuing mission to provide you with exceptional heart care, we have created designated Provider Care Teams.  These Care Teams include your primary Cardiologist (physician) and Advanced Practice Providers (APPs -  Physician Assistants and Nurse Practitioners) who all work together to provide you with the care you need, when you need it.  Your next appointment:   6 month(s)  The format for your next appointment:   In Person  Provider:   [name], MD   Coricidin HBP    Signed, [name], MD  04/26/2019 9:15 AM    [place]

## 2019-04-26 NOTE — Patient Instructions (Signed)
Medication Instructions:  The current medical regimen is effective;  continue present plan and medications.  *If you need a refill on your cardiac medications before your next appointment, please call your pharmacy*  Follow-Up: At [facility], you and your health needs are our priority.  As part of our continuing mission to provide you with exceptional heart care, we have created designated Provider Care Teams.  These Care Teams include your primary Cardiologist (physician) and Advanced Practice Providers (APPs -  Physician Assistants and Nurse Practitioners) who all work together to provide you with the care you need, when you need it.  Your next appointment:   6 month(s)  The format for your next appointment:   In Person  Provider:   [name], MD   Coricidin HBP

## 2019-05-18 ENCOUNTER — Encounter: Payer: Self-pay | Admitting: *Deleted

## 2019-05-18 ENCOUNTER — Telehealth: Payer: Self-pay | Admitting: Cardiology

## 2019-05-18 NOTE — Telephone Encounter (Signed)
New Message     Pt is calling and says he is traveling in 2 weeks out of the country and needs a note of some sort so he is able to travel     Please call

## 2019-05-18 NOTE — Telephone Encounter (Signed)
Spoke with patient who is needing a letter stating since testing positive for SARS-CoV-2 04/18/2019, it is too early to retest.  He is symptom free and has been for several weeks.  He checks daily temps which are all normal, no cough, no congestion, no recent exposure to anyone know with COVID.  Advised I will review with Dr Marlou Porch for his approval and call back once a letter has been completed.

## 2019-05-18 NOTE — Telephone Encounter (Signed)
Reviewed with Dr Marlou Porch.  Approval for letter completion given.  Letter written and left a front desk for patient pick-up as requested.  Pt aware ready for pick up.

## 2019-05-23 ENCOUNTER — Ambulatory Visit: Payer: 59

## 2019-05-24 NOTE — Telephone Encounter (Signed)
Called and spoke with patient who is concerned about the paperwork from the office not matching the name on his passport.  He reports he printed out his SARS-CoV-2 results and "Reginald [name]" prints out instead of [name].  I removed the "goes by name - Reginald [name]" from pt's demographics.  Pt reprinted results with his full name.  It now matches his passport.  Pt thanked me for the assistance and had no further needs.

## 2019-05-24 NOTE — Telephone Encounter (Signed)
Follow up  Patient has questions about the previous message about his letters. Please call to discuss.

## 2019-05-25 ENCOUNTER — Ambulatory Visit: Payer: 59

## 2019-06-08 ENCOUNTER — Ambulatory Visit: Payer: 59 | Attending: Internal Medicine

## 2019-06-08 DIAGNOSIS — Z23 Encounter for immunization: Secondary | ICD-10-CM | POA: Insufficient documentation

## 2019-06-08 NOTE — Progress Notes (Signed)
   Covid-19 Vaccination Clinic  Name:  Reginald [name]    MRN: [id number] DOB: [birth year]  06/08/2019  Mr. Bhatnagar was observed post Covid-19 immunization for 15 minutes without incidence. He was provided with Vaccine Information Sheet and instruction to access the V-Safe system.   Mr. Flint was instructed to call 911 with any severe reactions post vaccine: Marland Kitchen Difficulty breathing  . Swelling of your face and throat  . A fast heartbeat  . A bad rash all over your body  . Dizziness and weakness    Immunizations Administered    Name Date Dose VIS Date Route   Pfizer COVID-19 Vaccine 06/08/2019  9:16 AM 0.3 mL 03/24/2019 Intramuscular   Manufacturer: [place]   Lot: [id number]   [place]: [id number]

## 2019-06-19 ENCOUNTER — Encounter: Payer: Self-pay | Admitting: Internal Medicine

## 2019-06-19 ENCOUNTER — Telehealth (INDEPENDENT_AMBULATORY_CARE_PROVIDER_SITE_OTHER): Payer: 59 | Admitting: Internal Medicine

## 2019-06-19 ENCOUNTER — Telehealth: Payer: Self-pay

## 2019-06-19 ENCOUNTER — Ambulatory Visit: Payer: 59

## 2019-06-19 VITALS — Ht 72.0 in | Wt 221.0 lb

## 2019-06-19 DIAGNOSIS — D6869 Other thrombophilia: Secondary | ICD-10-CM

## 2019-06-19 DIAGNOSIS — I10 Essential (primary) hypertension: Secondary | ICD-10-CM

## 2019-06-19 DIAGNOSIS — R072 Precordial pain: Secondary | ICD-10-CM

## 2019-06-19 DIAGNOSIS — R079 Chest pain, unspecified: Secondary | ICD-10-CM | POA: Diagnosis not present

## 2019-06-19 DIAGNOSIS — I4819 Other persistent atrial fibrillation: Secondary | ICD-10-CM

## 2019-06-19 NOTE — Progress Notes (Signed)
Electrophysiology TeleHealth Note   Due to national recommendations of social distancing due to COVID 19, an audio/video telehealth visit is felt to be most appropriate for this patient at this time.  See MyChart message from today for the patient's consent to telehealth for [facility].  Date:  06/19/2019   ID:  Reginald [name], DOB May 08, 1950, MRN [id number]  Location: patient's home  Provider location:  [facility]  Evaluation Performed: Follow-up visit  PCP:  [name], MD   Electrophysiologist:  Dr [name]  Chief Complaint:  palpitations  History of Present Illness:    Reginald [name] is a 69 y.o. male who presents via telehealth conferencing today.  Since last being seen in our clinic, the patient reports doing very well.  No symptoms of afib.  He has had PACs, PVCs.  Over the past few weeks he has had some chest tightness. He wonders if he pulled a muscle but finds this worrisome.   Today, he denies symptoms of shortness of breath,  lower extremity edema, dizziness, presyncope, or syncope.  He had COVID in early January which was mild.  He has recovered.  The patient is otherwise without complaint today.   Past Medical History:  Diagnosis Date  . Allergy to alpha-gal   . Hypertension   . Impaired fasting glucose   . Persistent atrial fibrillation ([place]) 07/10/2012   cardioversion x 2  . Pneumonia    8th grade, without recurrence  . Ureteral stone     Past Surgical History:  Procedure Laterality Date  . ATRIAL FIBRILLATION ABLATION  04/19/2014  . ATRIAL FIBRILLATION ABLATION N/A 04/19/2014   Procedure: ATRIAL FIBRILLATION ABLATION;  Surgeon: [name], MD;  Location: [facility] CATH LAB;  Service: Cardiovascular;  Laterality: N/A;  . ATRIAL FIBRILLATION ABLATION N/A 02/15/2018   Procedure: ATRIAL FIBRILLATION ABLATION;  Surgeon: [name], MD;  Location: [place] CV LAB;  Service: Cardiovascular;  Laterality: N/A;  . CARDIOVERSION N/A 07/14/2012   Procedure:  CARDIOVERSION;  Surgeon: [name], MD;  Location: [place];  Service: Cardiovascular;  Laterality: N/A;  . CARDIOVERSION N/A 09/16/2012   Procedure: CARDIOVERSION;  Surgeon: [name], MD;  Location: [facility] ENDOSCOPY;  Service: Cardiovascular;  Laterality: N/A;  . CARDIOVERSION N/A 03/14/2014   Procedure: CARDIOVERSION;  Surgeon: [name], MD;  Location: [facility] ENDOSCOPY;  Service: Cardiovascular;  Laterality: N/A;  . CARDIOVERSION N/A 04/02/2017   Procedure: CARDIOVERSION;  Surgeon: [name], MD;  Location: [place];  Service: Cardiovascular;  Laterality: N/A;  . CARDIOVERSION N/A 12/21/2017   Procedure: CARDIOVERSION;  Surgeon: [name], MD;  Location: [facility] ENDOSCOPY;  Service: Cardiovascular;  Laterality: N/A;  . CARDIOVERSION N/A 10/05/2018   Procedure: CARDIOVERSION;  Surgeon: [name], MD;  Location: [place];  Service: Cardiovascular;  Laterality: N/A;  . COLONOSCOPY WITH PROPOFOL N/A 01/31/2013   Procedure: COLONOSCOPY WITH PROPOFOL;  Surgeon: [name], MD;  Location: [facility] ENDOSCOPY;  Service: Endoscopy;  Laterality: N/A;  . FRACTURE SURGERY    . ORIF METACARPAL FRACTURE Right   . SHOULDER ARTHROSCOPY W/ ROTATOR CUFF REPAIR Bilateral   . TEE WITHOUT CARDIOVERSION N/A 07/14/2012   Procedure: TRANSESOPHAGEAL ECHOCARDIOGRAM (TEE);  Surgeon: [name], MD;  Location: [facility] ENDOSCOPY;  Service: Cardiovascular;  Laterality: N/A;  . TEE WITHOUT CARDIOVERSION N/A 09/16/2012   Procedure: TRANSESOPHAGEAL ECHOCARDIOGRAM (TEE);  Surgeon: [name], MD;  Location: [facility] ENDOSCOPY;  Service: Cardiovascular;  Laterality: N/A;  . TEE WITHOUT CARDIOVERSION N/A 04/18/2014   Procedure:  TRANSESOPHAGEAL ECHOCARDIOGRAM (TEE);  Surgeon: [name], MD;  Location: [facility] ENDOSCOPY;  Service: Cardiovascular;  Laterality: N/A;  . TEE WITHOUT CARDIOVERSION N/A 04/02/2017   Procedure: TRANSESOPHAGEAL ECHOCARDIOGRAM (TEE);  Surgeon: [name], MD;  Location: [facility] ENDOSCOPY;   Service: Cardiovascular;  Laterality: N/A;    Current Outpatient Medications  Medication Sig Dispense Refill  . amLODipine (NORVASC) 5 MG tablet Take 5 mg by mouth daily.    Marland Kitchen azelastine (OPTIVAR) 0.05 % ophthalmic solution Place 1 drop into both eyes 2 (two) times daily as needed (for eye irritation).    . Cholecalciferol (CVS D3) 50 MCG (2000 UT) CAPS Take 1 tablet by mouth daily.    Marland Kitchen DILT-XR 240 MG 24 hr capsule Take 240 mg by mouth daily.  3  . diltiazem (CARDIZEM CD) 120 MG 24 hr capsule Take 1 capsule (120 mg total) by mouth daily as needed. 180 capsule 3  . diphenhydramine-acetaminophen (TYLENOL PM) 25-500 MG TABS tablet Take 1-2 tablets by mouth at bedtime as needed (sleep.).    Marland Kitchen ELIQUIS 5 MG TABS tablet TAKE 1 TABLET BY MOUTH TWICE A DAY 60 tablet 5  . EPINEPHrine 0.3 mg/0.3 mL IJ SOAJ injection Inject 0.3 mg into the skin as needed for allergies.    . flecainide (TAMBOCOR) 150 MG tablet Take 2 tablets (300 mg total) by mouth as needed (Take 2 tablets as needed once daily for breakthrough afib). 60 tablet 11  . fluticasone (FLONASE) 50 MCG/ACT nasal spray Place 1 spray into both nostrils daily as needed for allergies.    . Glucosamine 750 MG TABS Take 750 mg by mouth daily.    . metoprolol tartrate (LOPRESSOR) 25 MG tablet Take 1 tablet every 8 hours as needed for afib 45 tablet 3  . Multiple Vitamin (MULTIVITAMIN WITH MINERALS) TABS tablet Take 1 tablet by mouth daily.     . vitamin C (ASCORBIC ACID) 500 MG tablet Take 500 mg by mouth daily.    . Zinc 50 MG CAPS Take 1 tablet by mouth daily.     No current facility-administered medications for this visit.    Allergies:   Losartan, Food, and Lisinopril   Social History:  The patient  reports that he has never smoked. He has never used smokeless tobacco. He reports current alcohol use of about 14.0 standard drinks of alcohol per week. He reports that he does not use drugs.   Family History:  The patient's family history includes  Hypertension in his mother.   ROS:  Please see the history of present illness.   All other systems are personally reviewed and negative.    Exam:    Vital Signs:  Ht 6' (1.829 m)   Wt 221 lb (100.2 kg)   BMI 29.97 kg/m   Well sounding and appearing, alert and conversant, regular work of breathing,  good skin color Eyes- anicteric, neuro- grossly intact, skin- no apparent rash or lesions or cyanosis, mouth- oral mucosa is pink  Labs/Other Tests and Data Reviewed:    Recent Labs: 09/29/2018: BUN 13; Creatinine, Ser 0.87; Hemoglobin 15.0; Platelets 210; Potassium 3.8; Sodium 136   Wt Readings from Last 3 Encounters:  06/19/19 221 lb (100.2 kg)  04/26/19 224 lb (101.6 kg)  03/29/19 225 lb 12.8 oz (102.4 kg)       ASSESSMENT & PLAN:    1.  Persistent atrial fibrillation No afib post ablation He has pill in pocket flecainide but has not required this He is on eliquis  2. Chest discomfort Both typical and atypical features Prior cardiac CT revealed 47th percentile calcium score Will order myoview to further evaluate If abnormal, will involve Dr Marlou Porch' team  3. HTN Stable No change required today  4. OSA Followed by Drs Maxwell Caul and Ron Parker   Follow-up:  6 months with me   Patient Risk:  after full review of this patients clinical status, I feel that they are at moderate risk at this time.  Today, I have spent 15 minutes with the patient with telehealth technology discussing arrhythmia management .    Army Fossa, MD  06/19/2019 9:41 AM     [place]  [phone] (office) [phone] (fax)

## 2019-06-19 NOTE — Telephone Encounter (Signed)
Order placed for exercise myoview.  Instruction letter posted to [place].   Will send to scheduling pool.

## 2019-06-19 NOTE — Telephone Encounter (Signed)
-----   Message from [name], MD sent at 06/19/2019  9:50 AM EST ----- Order exercise myoview to evaluate chest discomfort Hold diltiazem 48 hours prior  Follow-up with me in 6 months

## 2019-06-21 ENCOUNTER — Telehealth (HOSPITAL_COMMUNITY): Payer: Self-pay

## 2019-06-21 NOTE — Telephone Encounter (Signed)
Attempted to contact the patient for his stress test instructions, there was no answer so I will try again later. S.Lazaro Isenhower EMTP

## 2019-06-27 ENCOUNTER — Other Ambulatory Visit: Payer: Self-pay

## 2019-06-27 ENCOUNTER — Ambulatory Visit (HOSPITAL_COMMUNITY): Payer: 59 | Attending: Cardiovascular Disease

## 2019-06-27 DIAGNOSIS — R072 Precordial pain: Secondary | ICD-10-CM | POA: Diagnosis present

## 2019-06-27 LAB — MYOCARDIAL PERFUSION IMAGING
Estimated workload: 8.5 METS
Exercise duration (min): 7 min
Exercise duration (sec): 0 s
LV dias vol: 141 mL (ref 62–150)
LV sys vol: 54 mL
MPHR: 152 {beats}/min
Peak HR: 146 {beats}/min
Percent HR: 96 %
Rest HR: 61 {beats}/min
SDS: 0
SRS: 0
SSS: 0
TID: 0.93

## 2019-06-27 MED ORDER — TECHNETIUM TC 99M TETROFOSMIN IV KIT
32.6000 | PACK | Freq: Once | INTRAVENOUS | Status: AC | PRN
  Administered 2019-06-27: 32.6 via INTRAVENOUS
  Filled 2019-06-27: qty 33

## 2019-06-27 MED ORDER — TECHNETIUM TC 99M TETROFOSMIN IV KIT
10.5000 | PACK | Freq: Once | INTRAVENOUS | Status: AC | PRN
  Administered 2019-06-27: 10.5 via INTRAVENOUS
  Filled 2019-06-27: qty 11

## 2019-06-30 ENCOUNTER — Ambulatory Visit: Payer: 59 | Attending: Internal Medicine

## 2019-06-30 DIAGNOSIS — Z23 Encounter for immunization: Secondary | ICD-10-CM

## 2019-06-30 NOTE — Progress Notes (Signed)
   Covid-19 Vaccination Clinic  Name:  Reginald [name]    MRN: [id number] DOB: Aug 17, 1950  06/30/2019  [name] was observed post Covid-19 immunization for 15 minutes without incident. He was provided with Vaccine Information Sheet and instruction to access the V-Safe system.   [name] was instructed to call 911 with any severe reactions post vaccine: Marland Kitchen Difficulty breathing  . Swelling of face and throat  . A fast heartbeat  . A bad rash all over body  . Dizziness and weakness   Immunizations Administered    Name Date Dose VIS Date Route   Pfizer COVID-19 Vaccine 06/30/2019  9:14 AM 0.3 mL 03/24/2019 Intramuscular   Manufacturer: [place]   Lot: [id number]   [place]: [id number]

## 2019-07-03 ENCOUNTER — Other Ambulatory Visit: Payer: Self-pay | Admitting: Cardiology

## 2019-07-31 ENCOUNTER — Other Ambulatory Visit (HOSPITAL_COMMUNITY): Payer: Self-pay | Admitting: Internal Medicine

## 2019-07-31 NOTE — Telephone Encounter (Signed)
Age 69, weight 100kg, SCr 0.87 on 09/29/18, last OV Jan 2021, afib indication

## 2019-11-07 ENCOUNTER — Encounter: Payer: Self-pay | Admitting: Cardiology

## 2019-11-07 ENCOUNTER — Ambulatory Visit: Payer: 59 | Admitting: Cardiology

## 2019-11-07 ENCOUNTER — Other Ambulatory Visit: Payer: Self-pay

## 2019-11-07 VITALS — BP 110/60 | HR 74 | Ht 72.0 in | Wt 208.0 lb

## 2019-11-07 DIAGNOSIS — Z7901 Long term (current) use of anticoagulants: Secondary | ICD-10-CM | POA: Diagnosis not present

## 2019-11-07 DIAGNOSIS — I4819 Other persistent atrial fibrillation: Secondary | ICD-10-CM

## 2019-11-07 DIAGNOSIS — D6869 Other thrombophilia: Secondary | ICD-10-CM

## 2019-11-07 DIAGNOSIS — G4733 Obstructive sleep apnea (adult) (pediatric): Secondary | ICD-10-CM | POA: Diagnosis not present

## 2019-11-07 NOTE — Progress Notes (Signed)
Cardiology Office Note:    Date:  11/07/2019   ID:  [name], DOB 04/25/1950, MRN [id number]  PCP:  [name], MD  [facility] Cardiologist:  [name], MD  [facility] Electrophysiologist:  None   Referring MD: [name], MD     History of Present Illness:    EION TIMBROOK is a 69 y.o. male here for the follow-up of paroxysmal atrial fibrillation post ablation by Dr. [name].  Has been doing very well without any symptoms of atrial fibrillation.  Has had prior PVCs and PACs.  Ended up having a stress test performed in March 2021, overall low risk with no ischemia.  He also had Covid in January 2021, symptoms were mild.  Overall he is doing great.  Continues with weight loss.  He is down a total of 60 pounds with decreased calorie and elliptical 5 times a week.  He is also cut out alcohol.  No fevers chills nausea vomiting syncope bleeding.  He does have occasional tinnitus.  Considering trip to the [place] in June of next year  Past Medical History:  Diagnosis Date  . Allergy to alpha-gal   . Hypertension   . Impaired fasting glucose   . Persistent atrial fibrillation ([place]) 07/10/2012   cardioversion x 2  . Pneumonia    8th grade, without recurrence  . Ureteral stone     Past Surgical History:  Procedure Laterality Date  . ATRIAL FIBRILLATION ABLATION  04/19/2014  . ATRIAL FIBRILLATION ABLATION N/A 04/19/2014   Procedure: ATRIAL FIBRILLATION ABLATION;  Surgeon: [name], MD;  Location: [facility] CATH LAB;  Service: Cardiovascular;  Laterality: N/A;  . ATRIAL FIBRILLATION ABLATION N/A 02/15/2018   Procedure: ATRIAL FIBRILLATION ABLATION;  Surgeon: [name], MD;  Location: [place] CV LAB;  Service: Cardiovascular;  Laterality: N/A;  . CARDIOVERSION N/A 07/14/2012   Procedure: CARDIOVERSION;  Surgeon: [name], MD;  Location: [place];  Service: Cardiovascular;  Laterality: N/A;  . CARDIOVERSION N/A 09/16/2012   Procedure: CARDIOVERSION;   Surgeon: [name], MD;  Location: [facility] ENDOSCOPY;  Service: Cardiovascular;  Laterality: N/A;  . CARDIOVERSION N/A 03/14/2014   Procedure: CARDIOVERSION;  Surgeon: [name], MD;  Location: [facility] ENDOSCOPY;  Service: Cardiovascular;  Laterality: N/A;  . CARDIOVERSION N/A 04/02/2017   Procedure: CARDIOVERSION;  Surgeon: [name], MD;  Location: [place];  Service: Cardiovascular;  Laterality: N/A;  . CARDIOVERSION N/A 12/21/2017   Procedure: CARDIOVERSION;  Surgeon: [name], MD;  Location: [facility] ENDOSCOPY;  Service: Cardiovascular;  Laterality: N/A;  . CARDIOVERSION N/A 10/05/2018   Procedure: CARDIOVERSION;  Surgeon: [name], MD;  Location: [place];  Service: Cardiovascular;  Laterality: N/A;  . COLONOSCOPY WITH PROPOFOL N/A 01/31/2013   Procedure: COLONOSCOPY WITH PROPOFOL;  Surgeon: [name], MD;  Location: [facility] ENDOSCOPY;  Service: Endoscopy;  Laterality: N/A;  . FRACTURE SURGERY    . ORIF METACARPAL FRACTURE Right   . SHOULDER ARTHROSCOPY W/ ROTATOR CUFF REPAIR Bilateral   . TEE WITHOUT CARDIOVERSION N/A 07/14/2012   Procedure: TRANSESOPHAGEAL ECHOCARDIOGRAM (TEE);  Surgeon: [name], MD;  Location: [facility] ENDOSCOPY;  Service: Cardiovascular;  Laterality: N/A;  . TEE WITHOUT CARDIOVERSION N/A 09/16/2012   Procedure: TRANSESOPHAGEAL ECHOCARDIOGRAM (TEE);  Surgeon: [name], MD;  Location: [facility] ENDOSCOPY;  Service: Cardiovascular;  Laterality: N/A;  . TEE WITHOUT CARDIOVERSION N/A 04/18/2014   Procedure: TRANSESOPHAGEAL ECHOCARDIOGRAM (TEE);  Surgeon: [name], MD;  Location: [place];  Service: Cardiovascular;  Laterality: N/A;  .  TEE WITHOUT CARDIOVERSION N/A 04/02/2017   Procedure: TRANSESOPHAGEAL ECHOCARDIOGRAM (TEE);  Surgeon: [name], MD;  Location: [facility] ENDOSCOPY;  Service: Cardiovascular;  Laterality: N/A;    Current Medications: Current Meds  Medication Sig  . amLODipine (NORVASC) 5 MG tablet TAKE 1 TABLET BY MOUTH EVERY DAY  .  azelastine (OPTIVAR) 0.05 % ophthalmic solution Place 1 drop into both eyes 2 (two) times daily as needed (for eye irritation).  . Cholecalciferol (CVS D3) 50 MCG (2000 UT) CAPS Take 1 tablet by mouth daily.  Marland Kitchen DILT-XR 240 MG 24 hr capsule Take 240 mg by mouth daily.  Marland Kitchen diltiazem (CARDIZEM CD) 120 MG 24 hr capsule Take 1 capsule (120 mg total) by mouth daily as needed.  . diphenhydramine-acetaminophen (TYLENOL PM) 25-500 MG TABS tablet Take 1-2 tablets by mouth at bedtime as needed (sleep.).  Marland Kitchen ELIQUIS 5 MG TABS tablet TAKE 1 TABLET BY MOUTH TWICE A DAY  . EPINEPHrine 0.3 mg/0.3 mL IJ SOAJ injection Inject 0.3 mg into the skin as needed for allergies.  . flecainide (TAMBOCOR) 150 MG tablet Take 2 tablets (300 mg total) by mouth as needed (Take 2 tablets as needed once daily for breakthrough afib).  . fluticasone (FLONASE) 50 MCG/ACT nasal spray Place 1 spray into both nostrils daily as needed for allergies.  . Glucosamine 750 MG TABS Take 750 mg by mouth daily.  . Multiple Vitamin (MULTIVITAMIN WITH MINERALS) TABS tablet Take 1 tablet by mouth daily.   . vitamin C (ASCORBIC ACID) 500 MG tablet Take 500 mg by mouth daily.  . Zinc 50 MG CAPS Take 1 tablet by mouth daily.     Allergies:   Losartan, Food, and Lisinopril   Social History   Socioeconomic History  . Marital status: Married    Spouse name: Not on file  . Number of children: Not on file  . Years of education: Not on file  . Highest education level: Not on file  Occupational History  . Not on file  Tobacco Use  . Smoking status: Never Smoker  . Smokeless tobacco: Never Used  Vaping Use  . Vaping Use: Never used  Substance and Sexual Activity  . Alcohol use: Yes    Alcohol/week: 14.0 standard drinks    Types: 7 Glasses of wine, 7 Shots of liquor per week    Comment: 04/19/2014 "2-3 glasses of wine or liquor per day"  . Drug use: No  . Sexual activity: Not Currently  Other Topics Concern  . Not on file  Social History  Narrative   Lives in [place] with spouse.  2 children (ages 22 and 41).   Owns [employer]   Social Determinants of Health   Financial Resource Strain:   . Difficulty of Paying Living Expenses:   Food Insecurity:   . Worried About Charity fundraiser in the Last Year:   . Arboriculturist in the Last Year:   Transportation Needs:   . Film/video editor (Medical):   Marland Kitchen Lack of Transportation (Non-Medical):   Physical Activity:   . Days of Exercise per Week:   . Minutes of Exercise per Session:   Stress:   . Feeling of Stress :   Social Connections:   . Frequency of Communication with Friends and Family:   . Frequency of Social Gatherings with Friends and Family:   . Attends Religious Services:   . Active Member of Clubs or Organizations:   . Attends Archivist Meetings:   .  Marital Status:      Family History: The patient's family history includes Hypertension in his mother.  ROS:   Please see the history of present illness.     All other systems reviewed and are negative.  EKGs/Labs/Other Studies Reviewed:    The following studies were reviewed today: Nuclear stress test 06/27/2019:  Nuclear stress EF: 62%.  Blood pressure demonstrated a normal response to exercise.  There was no ST segment deviation noted during stress.  The study is normal. No ischemia.  This is a low risk study.  The left ventricular ejection fraction is normal (55-65%).  Recent Labs: No results found for requested labs within last 8760 hours.  Recent Lipid Panel No results found for: CHOL, TRIG, HDL, CHOLHDL, VLDL, LDLCALC, LDLDIRECT  Physical Exam:    VS:  BP (!) 110/60   Pulse 74   Ht 6' (1.829 m)   Wt (!) 208 lb (94.3 kg)   SpO2 97%   BMI 28.21 kg/m     Wt Readings from Last 3 Encounters:  11/07/19 (!) 208 lb (94.3 kg)  06/27/19 221 lb (100.2 kg)  06/19/19 221 lb (100.2 kg)     GEN:  Well nourished, well developed in no acute distress HEENT:  Normal NECK: No JVD; No carotid bruits LYMPHATICS: No lymphadenopathy CARDIAC: RRR, no murmurs, rubs, gallops RESPIRATORY:  Clear to auscultation without rales, wheezing or rhonchi  ABDOMEN: Soft, non-tender, non-distended MUSCULOSKELETAL:  No edema; No deformity  SKIN: Warm and dry NEUROLOGIC:  Alert and oriented x 3 PSYCHIATRIC:  Normal affect   ASSESSMENT:    1. Persistent atrial fibrillation ([place])   2. Acquired thrombophilia ([place])   3. Chronic anticoagulation   4. Obstructive sleep apnea    PLAN:    In order of problems listed above:  Paroxysmal atrial fibrillation -Overall doing well post ablation x 2 by Dr. [name]. -Has flecainide to just in case for pill in the pocket approach but he has not needed to use that much (3 times past year mostly for PVC's). -Continue with Eliquis for chronic anticoagulation.  No bleeding.  Atypical chest pain -Evaluated previously with stress test. -Continue with aggressive secondary risk factor prevention given his cardiac CT which revealed coronary calcification.  Essential hypertension -Stable without any changes.  Medications reviewed.  On Norvasc as well as diltiazem.  I am fine with this.  His blood pressures been excellent.  No dizziness.  Obstructive sleep apnea -Dr. Maxwell Caul Dr. Ron Parker.  Doing well.   Medication Adjustments/Labs and Tests Ordered: Current medicines are reviewed at length with the patient today.  Concerns regarding medicines are outlined above.  No orders of the defined types were placed in this encounter.  No orders of the defined types were placed in this encounter.   Patient Instructions  Medication Instructions:  The current medical regimen is effective;  continue present plan and medications.  *If you need a refill on your cardiac medications before your next appointment, please call your pharmacy*  Follow-Up: At [facility], you and your health needs are our priority.  As part of our continuing  mission to provide you with exceptional heart care, we have created designated Provider Care Teams.  These Care Teams include your primary Cardiologist (physician) and Advanced Practice Providers (APPs -  Physician Assistants and Nurse Practitioners) who all work together to provide you with the care you need, when you need it.  We recommend signing up for the patient portal called "MyChart".  Sign up information  is provided on this After Visit Summary.  MyChart is used to connect with patients for Virtual Visits (Telemedicine).  Patients are able to view lab/test results, encounter notes, upcoming appointments, etc.  Non-urgent messages can be sent to your provider as well.   To learn more about what you can do with MyChart, go to [email or web address].    Your next appointment:   12 month(s)  The format for your next appointment:   In Person  Provider:   [name], MD   Thank you for choosing [facility]!!        Signed, [name], MD  11/07/2019 10:02 AM    [place]

## 2019-11-07 NOTE — Patient Instructions (Signed)
Medication Instructions:  The current medical regimen is effective;  continue present plan and medications.  *If you need a refill on your cardiac medications before your next appointment, please call your pharmacy*  Follow-Up: At [facility], you and your health needs are our priority.  As part of our continuing mission to provide you with exceptional heart care, we have created designated Provider Care Teams.  These Care Teams include your primary Cardiologist (physician) and Advanced Practice Providers (APPs -  Physician Assistants and Nurse Practitioners) who all work together to provide you with the care you need, when you need it.  We recommend signing up for the patient portal called "MyChart".  Sign up information is provided on this After Visit Summary.  MyChart is used to connect with patients for Virtual Visits (Telemedicine).  Patients are able to view lab/test results, encounter notes, upcoming appointments, etc.  Non-urgent messages can be sent to your provider as well.   To learn more about what you can do with MyChart, go to [email or web address].    Your next appointment:   12 month(s)  The format for your next appointment:   In Person  Provider:   Mark Skains, MD   Thank you for choosing [facility]!!      

## 2019-12-24 IMAGING — CT CT HEART MORPH/PULM VEIN W/ CM & W/O CA SCORE
3 of 6 series · 10 of 20 positions shown, 11 images · non-contrast
Comparison: None.

CLINICAL DATA: Atrial fibrillation scheduled for an ablation.

EXAM:
Cardiac CT/CTA
TECHNIQUE: The patient was scanned on a Siemens Force [REDACTED]ice  scanner.

[Series 7: best diast · axial · 0.45mm/px · z∈[+1186,+1275]mm · 4 of 372 slices shown, 5 images]
[im 75/372  vessel]
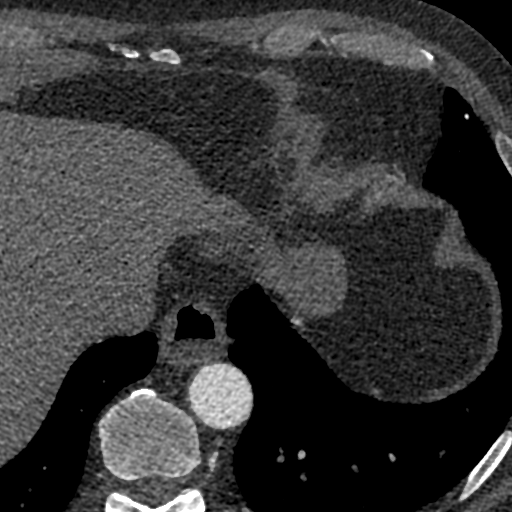
[im 75/372  lung]
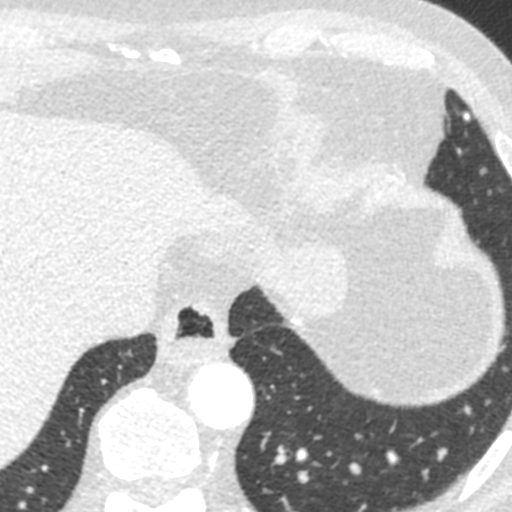
[im 149/372  vessel]
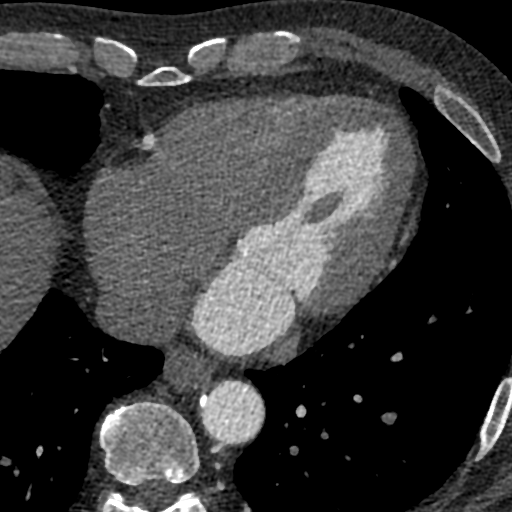
[im 223/372  vessel]
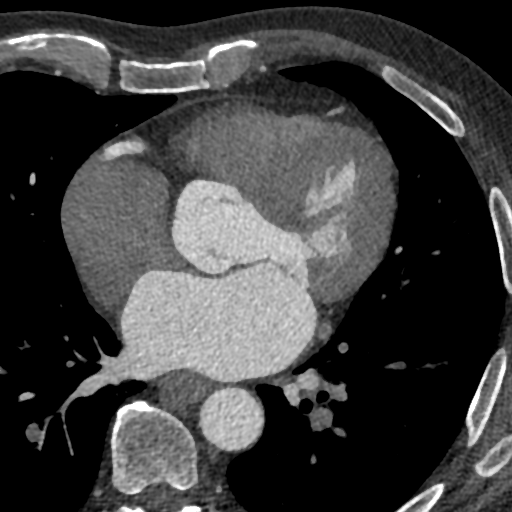
[im 297/372  vessel]
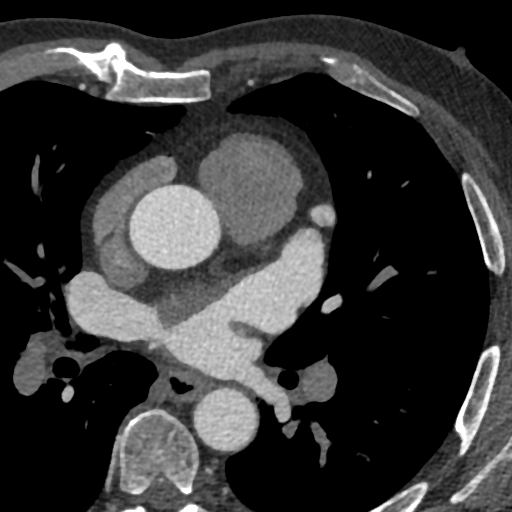

[Series 8: +300 ms · axial · 0.45mm/px · z∈[+1186,+1275]mm · 4 of 372 slices shown]
[im 75/372  vessel]
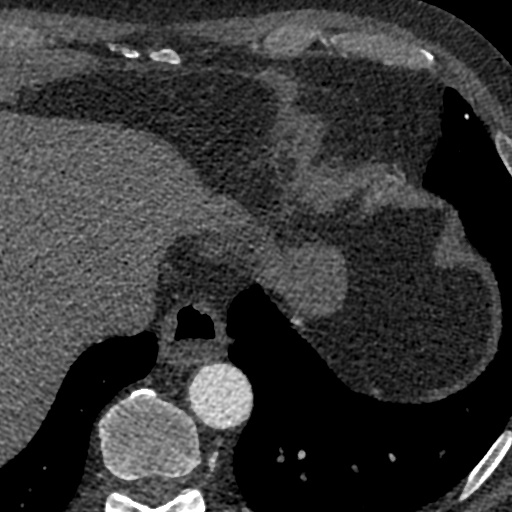
[im 149/372  vessel]
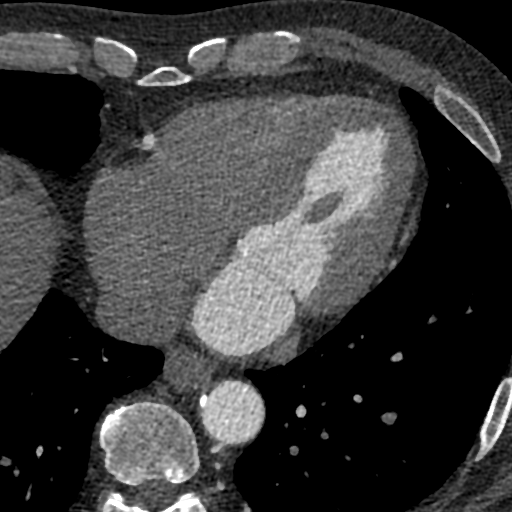
[im 223/372  vessel]
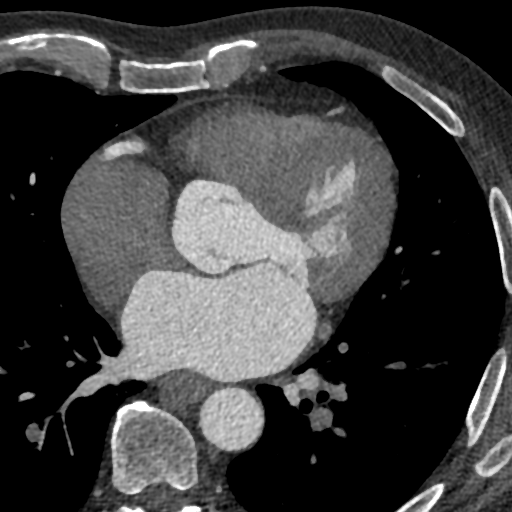
[im 297/372  vessel]
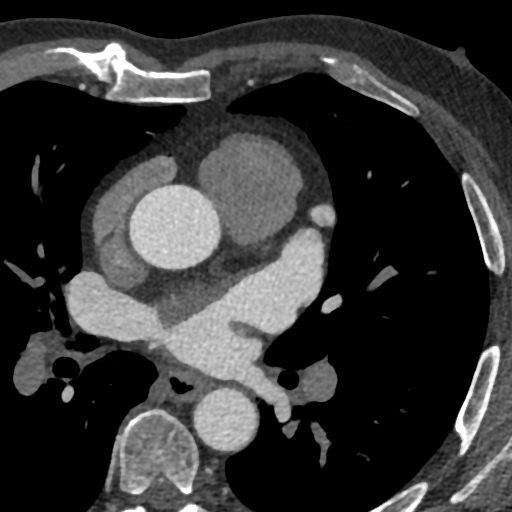

[Series 13: +(id) delay · axial · delayed · 0.45mm/px · z∈[+1228,+1264]mm · 2 of 215 slices shown]
[im 72/215  vessel]
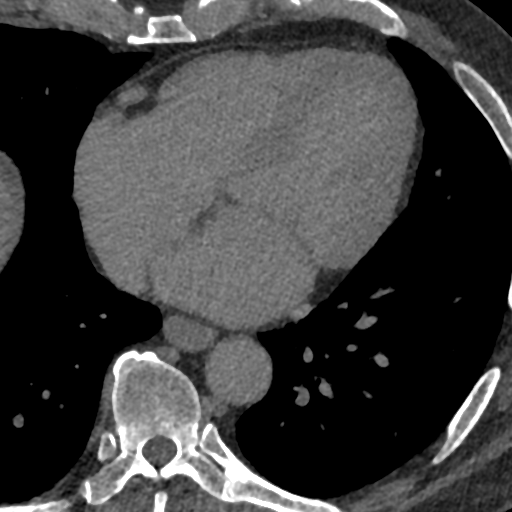
[im 143/215  vessel]
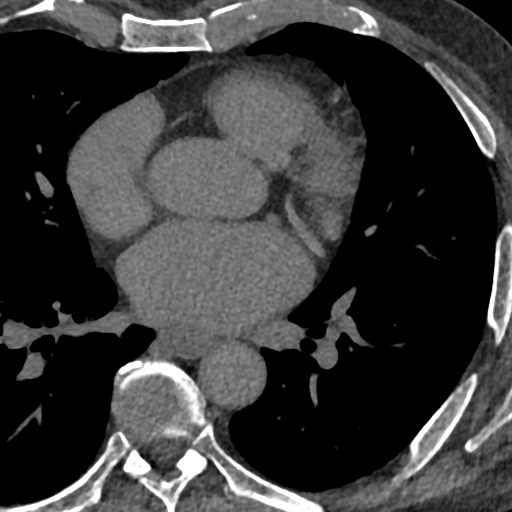

[10 of 20 positions shown; findings below may reference images not displayed]

FINDINGS: A 120 kV prospective scan was triggered in the ascending thoracic
aorta at 140 HU's. Gantry rotation speed was 250 msecs and
collimation was .6 mm. No beta blockade and no NTG was given. The 3D
data set was reconstructed for best systolic and diastolic phases
along with delayed images of the LIVIER Images analyzed on a dedicated
work station using MPR, MIP and VRT modes. The patient received 80
cc of contrast.

There was mild bi atrial enlargement. The atrial septum was
aneurysmal with no obvious PFO/ASD. There was no LIVIER thrombus. PV
anatomy was normal. Aortic root was normal at 3.7 cm

No pericardial effusion. Esophagus courses closest to the LLPV
ostium

RUPV: Ostium 21.1 mm   area 3.53 cm2

RLPV:  Ostium 16.1 mm  area 2.21 cm2

LUPV:  Ostium 21.1 mm area 3.86 cm2

LLPV:  Ostium 14.7 mm  area 1.78 cm2 somewhat slit like orifice

Calcium score Calcium noted in mid and distal LAD as well as
proximal RCA
IMPRESSION: 1.  No LIVIER thrombus

2.  Mild bi atrial enlargement

3.  Aneurysmal atrial septum

4.  Normal PV anatomy

5.  Normal aortic root 3.7 cm

6.  No pericardial effusion

7. Coronary calcium score 82 which is 47 th percentile for age and
sex

Ge Ri Biaigo

EXAM:
OVER-READ INTERPRETATION  CT CHEST

The following report is an over-read performed by radiologist Dr.
Predrag Belko [REDACTED] on 02/09/2018. This
over-read does not include interpretation of cardiac or coronary
anatomy or pathology. The coronary CTA interpretation by the
cardiologist is attached.
FINDINGS: Vascular: Heart is upper limits normal in size. Aorta is normal
caliber.

Mediastinum/Nodes: No adenopathy in the lower mediastinum or hila.
Small hiatal hernia.

Lungs/Pleura: Visualized lungs clear.  No effusions.

Upper Abdomen: Imaging into the upper abdomen shows no acute
findings.

Musculoskeletal: No acute bony abnormality.
IMPRESSION: No acute extra cardiac abnormality.

Small hiatal hernia.

## 2019-12-25 ENCOUNTER — Other Ambulatory Visit: Payer: Self-pay

## 2019-12-25 ENCOUNTER — Ambulatory Visit: Payer: 59 | Admitting: Internal Medicine

## 2019-12-25 ENCOUNTER — Encounter: Payer: Self-pay | Admitting: Internal Medicine

## 2019-12-25 VITALS — BP 120/68 | HR 60 | Ht 72.0 in | Wt 211.0 lb

## 2019-12-25 DIAGNOSIS — I10 Essential (primary) hypertension: Secondary | ICD-10-CM

## 2019-12-25 DIAGNOSIS — G4733 Obstructive sleep apnea (adult) (pediatric): Secondary | ICD-10-CM | POA: Diagnosis not present

## 2019-12-25 DIAGNOSIS — I4819 Other persistent atrial fibrillation: Secondary | ICD-10-CM | POA: Diagnosis not present

## 2019-12-25 NOTE — Progress Notes (Signed)
PCP: [name], MD Primary Cardiologist: Dr Marlou Porch Primary EP: Dr Tim Lair is a 69 y.o. male who presents today for routine electrophysiology followup.  Since last being seen in our clinic, the patient reports doing very well.  Today, he denies symptoms of palpitations, chest pain, shortness of breath,  lower extremity edema, dizziness, presyncope, or syncope.  The patient is otherwise without complaint today.   Past Medical History:  Diagnosis Date  . Allergy to alpha-gal   . Hypertension   . Impaired fasting glucose   . Persistent atrial fibrillation ([place]) 07/10/2012   cardioversion x 2  . Pneumonia    8th grade, without recurrence  . Ureteral stone    Past Surgical History:  Procedure Laterality Date  . ATRIAL FIBRILLATION ABLATION  04/19/2014  . ATRIAL FIBRILLATION ABLATION N/A 04/19/2014   Procedure: ATRIAL FIBRILLATION ABLATION;  Surgeon: [name], MD;  Location: [facility] CATH LAB;  Service: Cardiovascular;  Laterality: N/A;  . ATRIAL FIBRILLATION ABLATION N/A 02/15/2018   Procedure: ATRIAL FIBRILLATION ABLATION;  Surgeon: [name], MD;  Location: [place] CV LAB;  Service: Cardiovascular;  Laterality: N/A;  . CARDIOVERSION N/A 07/14/2012   Procedure: CARDIOVERSION;  Surgeon: [name], MD;  Location: [place];  Service: Cardiovascular;  Laterality: N/A;  . CARDIOVERSION N/A 09/16/2012   Procedure: CARDIOVERSION;  Surgeon: [name], MD;  Location: [facility] ENDOSCOPY;  Service: Cardiovascular;  Laterality: N/A;  . CARDIOVERSION N/A 03/14/2014   Procedure: CARDIOVERSION;  Surgeon: [name], MD;  Location: [facility] ENDOSCOPY;  Service: Cardiovascular;  Laterality: N/A;  . CARDIOVERSION N/A 04/02/2017   Procedure: CARDIOVERSION;  Surgeon: [name], MD;  Location: [place];  Service: Cardiovascular;  Laterality: N/A;  . CARDIOVERSION N/A 12/21/2017   Procedure: CARDIOVERSION;  Surgeon: [name], MD;  Location: [facility] ENDOSCOPY;  Service:  Cardiovascular;  Laterality: N/A;  . CARDIOVERSION N/A 10/05/2018   Procedure: CARDIOVERSION;  Surgeon: [name], MD;  Location: [place];  Service: Cardiovascular;  Laterality: N/A;  . COLONOSCOPY WITH PROPOFOL N/A 01/31/2013   Procedure: COLONOSCOPY WITH PROPOFOL;  Surgeon: [name], MD;  Location: [facility] ENDOSCOPY;  Service: Endoscopy;  Laterality: N/A;  . FRACTURE SURGERY    . ORIF METACARPAL FRACTURE Right   . SHOULDER ARTHROSCOPY W/ ROTATOR CUFF REPAIR Bilateral   . TEE WITHOUT CARDIOVERSION N/A 07/14/2012   Procedure: TRANSESOPHAGEAL ECHOCARDIOGRAM (TEE);  Surgeon: [name], MD;  Location: [facility] ENDOSCOPY;  Service: Cardiovascular;  Laterality: N/A;  . TEE WITHOUT CARDIOVERSION N/A 09/16/2012   Procedure: TRANSESOPHAGEAL ECHOCARDIOGRAM (TEE);  Surgeon: [name], MD;  Location: [facility] ENDOSCOPY;  Service: Cardiovascular;  Laterality: N/A;  . TEE WITHOUT CARDIOVERSION N/A 04/18/2014   Procedure: TRANSESOPHAGEAL ECHOCARDIOGRAM (TEE);  Surgeon: [name], MD;  Location: [facility] ENDOSCOPY;  Service: Cardiovascular;  Laterality: N/A;  . TEE WITHOUT CARDIOVERSION N/A 04/02/2017   Procedure: TRANSESOPHAGEAL ECHOCARDIOGRAM (TEE);  Surgeon: [name], MD;  Location: [facility] ENDOSCOPY;  Service: Cardiovascular;  Laterality: N/A;    ROS- all systems are reviewed and negatives except as per HPI above  Current Outpatient Medications  Medication Sig Dispense Refill  . amLODipine (NORVASC) 5 MG tablet TAKE 1 TABLET BY MOUTH EVERY DAY 90 tablet 3  . azelastine (OPTIVAR) 0.05 % ophthalmic solution Place 1 drop into both eyes 2 (two) times daily as needed (for eye irritation).    . Cholecalciferol (CVS D3) 50 MCG (2000 UT) CAPS Take 1 tablet by mouth daily.    Marland Kitchen DILT-XR 240 MG  24 hr capsule Take 240 mg by mouth daily.  3  . diltiazem (CARDIZEM CD) 120 MG 24 hr capsule Take 1 capsule (120 mg total) by mouth daily as needed. 180 capsule 3  . diphenhydramine-acetaminophen (TYLENOL PM) 25-500  MG TABS tablet Take 1-2 tablets by mouth at bedtime as needed (sleep.).    Marland Kitchen ELIQUIS 5 MG TABS tablet TAKE 1 TABLET BY MOUTH TWICE A DAY 60 tablet 5  . EPINEPHrine 0.3 mg/0.3 mL IJ SOAJ injection Inject 0.3 mg into the skin as needed for allergies.    . flecainide (TAMBOCOR) 150 MG tablet Take 2 tablets (300 mg total) by mouth as needed (Take 2 tablets as needed once daily for breakthrough afib). 60 tablet 11  . fluticasone (FLONASE) 50 MCG/ACT nasal spray Place 1 spray into both nostrils daily as needed for allergies.    . Glucosamine 750 MG TABS Take 750 mg by mouth daily.    . Multiple Vitamin (MULTIVITAMIN WITH MINERALS) TABS tablet Take 1 tablet by mouth daily.     . vitamin C (ASCORBIC ACID) 500 MG tablet Take 500 mg by mouth daily.    . Zinc 50 MG CAPS Take 1 tablet by mouth daily.     No current facility-administered medications for this visit.    Physical Exam: Vitals:   12/25/19 1356  BP: 120/68  Pulse: 60  SpO2: 96%  Weight: 211 lb (95.7 kg)  Height: 6' (1.829 m)    GEN- The patient is well appearing, alert and oriented x 3 today.   Head- normocephalic, atraumatic Eyes-  Sclera clear, conjunctiva pink Ears- hearing intact Oropharynx- clear Lungs- Clear to ausculation bilaterally, normal work of breathing Heart- Regular rate and rhythm, no murmurs, rubs or gallops, PMI not laterally displaced GI- soft, NT, ND, + BS Extremities- no clubbing, cyanosis, or edema  Wt Readings from Last 3 Encounters:  12/25/19 211 lb (95.7 kg)  11/07/19 (!) 208 lb (94.3 kg)  06/27/19 221 lb (100.2 kg)    EKG tracing ordered today is personally reviewed and shows sinus, PR 208 msec  Assessment and Plan:  1. Persistent atrial fibrillation Doing well s/p ablation x 2 off AAD therapy  2. HTN Stable No change required today  3. OSA Follows with Drs Maxwell Caul and Ron Parker  Risks, benefits and potential toxicities for medications prescribed and/or refilled reviewed with patient today.    Return in 6 months  [name] MD, [facility] 12/25/2019 2:04 PM

## 2019-12-25 NOTE — Patient Instructions (Addendum)
Medication Instructions:  Your physician recommends that you continue on your current medications as directed. Please refer to the Current Medication list given to you today.  *If you need a refill on your cardiac medications before your next appointment, please call your pharmacy*  Lab Work: None ordered.  If you have labs (blood work) drawn today and your tests are completely normal, you will receive your results only by: Marland Kitchen MyChart Message (if you have MyChart) OR . A paper copy in the mail If you have any lab test that is abnormal or we need to change your treatment, we will call you to review the results.  Testing/Procedures: None ordered.  Follow-Up: At [facility], you and your health needs are our priority.  As part of our continuing mission to provide you with exceptional heart care, we have created designated Provider Care Teams.  These Care Teams include your primary Cardiologist (physician) and Advanced Practice Providers (APPs -  Physician Assistants and Nurse Practitioners) who all work together to provide you with the care you need, when you need it.  We recommend signing up for the patient portal called "MyChart".  Sign up information is provided on this After Visit Summary.  MyChart is used to connect with patients for Virtual Visits (Telemedicine).  Patients are able to view lab/test results, encounter notes, upcoming appointments, etc.  Non-urgent messages can be sent to your provider as well.   To learn more about what you can do with MyChart, go to [email or web address].    Your next appointment:   Your physician wants you to follow-up in: 06/27/19 at 9:30 am with Dr. [name].  Other Instructions:

## 2020-01-10 ENCOUNTER — Telehealth: Payer: Self-pay | Admitting: Internal Medicine

## 2020-01-10 NOTE — Telephone Encounter (Signed)
Patient c/o Palpitations:  High priority if patient c/o lightheadedness, shortness of breath, or chest pain  1) How long have you had palpitations/irregular HR/ Afib? Are you having the symptoms now? Sensation in chest  2) Are you currently experiencing lightheadedness, SOB or CP? A lil lightheadedness   3) Do you have a history of afib (atrial fibrillation) or irregular heart rhythm? Yes  4) Have you checked your BP or HR? (document readings if available): N/A  5) Are you experiencing any other symptoms? N/A

## 2020-01-10 NOTE — Telephone Encounter (Signed)
Patient states that he thinks he is back in A fib. He denies any SOB, chest pain, or dizziness. Reports that he might have some slight lightheadedness. He states that he can feel his heart fluttering and skipping beats. It races at times and slows back down. He has not taken his HR but states he thinks it was around 100 bpm. He states that he did take two flecainide about an hour ago. He seems that it seemed to help a little bit. He says his heart rate seems to have slowed down a bit but it is still irregular. Patient will continue to monitor and I will route to Dr. [name] for advisedment.

## 2020-01-11 NOTE — Telephone Encounter (Signed)
He is feeling better this morning. Heart rate 68. We discussed possible triggers to avoid for afib. Advised him to call us with any other concerns if they arise.

## 2020-01-30 ENCOUNTER — Other Ambulatory Visit: Payer: Self-pay | Admitting: Orthopaedic Surgery

## 2020-01-30 DIAGNOSIS — M25512 Pain in left shoulder: Secondary | ICD-10-CM

## 2020-01-31 ENCOUNTER — Telehealth: Payer: Self-pay | Admitting: *Deleted

## 2020-01-31 ENCOUNTER — Other Ambulatory Visit (HOSPITAL_COMMUNITY): Payer: Self-pay | Admitting: Internal Medicine

## 2020-01-31 NOTE — Telephone Encounter (Signed)
    [facility] Pre-operative Risk Assessment    HEARTCARE STAFF: - Please ensure there is not already an duplicate clearance open for this procedure. - Under Visit Info/Reason for Call, type in Other and utilize the format Clearance MM/DD/YY or Clearance TBD. Do not use dashes or single digits. - If request is for dental extraction, please clarify the # of teeth to be extracted.  Request for surgical clearance:  1. What type of surgery is being performed? LEFT REVERSE TOTAL SHOULDER REPLACEMENT   2. When is this surgery scheduled? TBD   3. What type of clearance is required (medical clearance vs. Pharmacy clearance to hold med vs. Both)? BOTH  4. Are there any medications that need to be held prior to surgery and how long? ELIQUIS   5. Practice name and name of physician performing surgery? [facility]; DR. DAX VARKEY   6. What is the office phone number? [phone]   7.   What is the office fax number? [place].   Anesthesia type (None, local, MAC, general) ? CHOICE   Julaine Hua 01/31/2020, 12:49 PM  _________________________________________________________________   (provider comments below)

## 2020-01-31 NOTE — Telephone Encounter (Signed)
Eliquis 5mg  refill request received. Patient is 69 years old, weight-95.7kg, Crea-0.73 on 10/31/19 via [place] at [place], [place], and last seen by Dr. [name] on 12/25/2019. Dose is appropriate based on dosing criteria. Will send in refill to requested pharmacy.

## 2020-01-31 NOTE — Telephone Encounter (Signed)
Patient with diagnosis of afib on Eliquis for anticoagulation.    Procedure: LEFT REVERSE TOTAL SHOULDER REPLACEMENT  Date of procedure: TBD    :[id number]}  CHA2DS2-VASc Score = 2  This indicates a 2.2% annual risk of stroke. The patient's score is based upon: CHF History: 0 HTN History: 1 Diabetes History: 0 Stroke History: 0 Vascular Disease History: 0 Age Score: 1 Gender Score: 0  CrCl >110mL/min Platelet count 209K  Per office protocol, patient can hold Eliquis for 2 days prior to procedure.

## 2020-02-01 NOTE — Telephone Encounter (Signed)
Left VM

## 2020-02-02 NOTE — Telephone Encounter (Signed)
   Primary Cardiologist: [name], MD  Chart reviewed as part of pre-operative protocol coverage. Patient recently seen by Dr. [name] on 12/25/2019 and was doing well from a cardiac standpoint at that time. Patient was contact today for further pre-op evaluation. He reports doing well since last office visit. He does report one palpitations that lasted for a couple of hours (he states he may have been in atrial fibrillation but is not sure). He took 2 doses of Flecainide and 1 dose of Cardizem and palpitations resolved. No other cardiac symptoms. No chest pain, shortness of breath, orthopnea, syncope. He is very activity and is easily able to complete >4.0 METS. Given past medical history and time since last visit, based on ACC/AHA guidelines, Reginald [name] would be at acceptable risk for the planned procedure without further cardiovascular testing.   Per Pharmacy and office protocol, "patient can hold Eliquis for 2 days prior to procedure." This should be restarted as soon as able postoperatively.   The patient was advised that if he develops new symptoms prior to surgery to contact our office to arrange for a follow-up visit, and he verbalized understanding.  I will route this recommendation to the requesting party via Epic fax function and remove from pre-op pool.  Please call with questions.  Darreld Mclean, PA-C 02/02/2020, 3:40 PM

## 2020-02-02 NOTE — Telephone Encounter (Signed)
Patient returning call.

## 2020-02-13 ENCOUNTER — Other Ambulatory Visit: Payer: 59

## 2020-03-22 ENCOUNTER — Ambulatory Visit
Admission: RE | Admit: 2020-03-22 | Discharge: 2020-03-22 | Disposition: A | Discharge status: Home / Self Care | Payer: 59 | Source: Ambulatory Visit | Attending: Orthopaedic Surgery | Admitting: Orthopaedic Surgery

## 2020-03-22 ENCOUNTER — Other Ambulatory Visit: Payer: Self-pay

## 2020-03-22 DIAGNOSIS — M25512 Pain in left shoulder: Secondary | ICD-10-CM

## 2020-04-19 ENCOUNTER — Other Ambulatory Visit: Payer: Self-pay | Admitting: Internal Medicine

## 2020-06-26 ENCOUNTER — Other Ambulatory Visit: Payer: Self-pay

## 2020-06-26 ENCOUNTER — Encounter: Payer: Self-pay | Admitting: Internal Medicine

## 2020-06-26 ENCOUNTER — Ambulatory Visit: Payer: 59 | Admitting: Internal Medicine

## 2020-06-26 VITALS — BP 122/68 | HR 71 | Ht 72.0 in | Wt 214.2 lb

## 2020-06-26 DIAGNOSIS — I4819 Other persistent atrial fibrillation: Secondary | ICD-10-CM | POA: Diagnosis not present

## 2020-06-26 DIAGNOSIS — I10 Essential (primary) hypertension: Secondary | ICD-10-CM | POA: Diagnosis not present

## 2020-06-26 DIAGNOSIS — G4733 Obstructive sleep apnea (adult) (pediatric): Secondary | ICD-10-CM

## 2020-06-26 NOTE — Patient Instructions (Addendum)
Medication Instructions:  Your physician recommends that you continue on your current medications as directed. Please refer to the Current Medication list given to you today.  Labwork: None ordered.  Testing/Procedures: None ordered.  Follow-Up: Your physician wants you to follow-up in: 6 months with    Tommye Standard, PA-C     You will receive a reminder letter in the mail two months in advance. If you don't receive a letter, please call our office to schedule the follow-up appointment. 06/30/2021 at 9:30 am with Dr. [name].  Any Other Special Instructions Will Be Listed Below (If Applicable).  If you need a refill on your cardiac medications before your next appointment, please call your pharmacy.

## 2020-06-26 NOTE — Progress Notes (Signed)
PCP: [name], MD Primary Cardiologist: Dr Marlou Porch Primary EP: Dr Tim Lair is a 70 y.o. male who presents today for routine electrophysiology followup.  Since last being seen in our clinic, the patient reports doing very well.  Today, he denies symptoms of palpitations, chest pain, shortness of breath,  lower extremity edema, dizziness, presyncope, or syncope.  The patient is otherwise without complaint today.   Past Medical History:  Diagnosis Date  . Allergy to alpha-gal   . Hypertension   . Impaired fasting glucose   . Persistent atrial fibrillation ([place]) 07/10/2012   cardioversion x 2  . Pneumonia    8th grade, without recurrence  . Ureteral stone    Past Surgical History:  Procedure Laterality Date  . ATRIAL FIBRILLATION ABLATION  04/19/2014  . ATRIAL FIBRILLATION ABLATION N/A 04/19/2014   Procedure: ATRIAL FIBRILLATION ABLATION;  Surgeon: [name], MD;  Location: [facility] CATH LAB;  Service: Cardiovascular;  Laterality: N/A;  . ATRIAL FIBRILLATION ABLATION N/A 02/15/2018   Procedure: ATRIAL FIBRILLATION ABLATION;  Surgeon: [name], MD;  Location: [place] CV LAB;  Service: Cardiovascular;  Laterality: N/A;  . CARDIOVERSION N/A 07/14/2012   Procedure: CARDIOVERSION;  Surgeon: [name], MD;  Location: [place];  Service: Cardiovascular;  Laterality: N/A;  . CARDIOVERSION N/A 09/16/2012   Procedure: CARDIOVERSION;  Surgeon: [name], MD;  Location: [facility] ENDOSCOPY;  Service: Cardiovascular;  Laterality: N/A;  . CARDIOVERSION N/A 03/14/2014   Procedure: CARDIOVERSION;  Surgeon: [name], MD;  Location: [facility] ENDOSCOPY;  Service: Cardiovascular;  Laterality: N/A;  . CARDIOVERSION N/A 04/02/2017   Procedure: CARDIOVERSION;  Surgeon: [name], MD;  Location: [place];  Service: Cardiovascular;  Laterality: N/A;  . CARDIOVERSION N/A 12/21/2017   Procedure: CARDIOVERSION;  Surgeon: [name], MD;  Location: [facility] ENDOSCOPY;  Service:  Cardiovascular;  Laterality: N/A;  . CARDIOVERSION N/A 10/05/2018   Procedure: CARDIOVERSION;  Surgeon: [name], MD;  Location: [place];  Service: Cardiovascular;  Laterality: N/A;  . COLONOSCOPY WITH PROPOFOL N/A 01/31/2013   Procedure: COLONOSCOPY WITH PROPOFOL;  Surgeon: [name], MD;  Location: [facility] ENDOSCOPY;  Service: Endoscopy;  Laterality: N/A;  . FRACTURE SURGERY    . ORIF METACARPAL FRACTURE Right   . SHOULDER ARTHROSCOPY W/ ROTATOR CUFF REPAIR Bilateral   . TEE WITHOUT CARDIOVERSION N/A 07/14/2012   Procedure: TRANSESOPHAGEAL ECHOCARDIOGRAM (TEE);  Surgeon: [name], MD;  Location: [facility] ENDOSCOPY;  Service: Cardiovascular;  Laterality: N/A;  . TEE WITHOUT CARDIOVERSION N/A 09/16/2012   Procedure: TRANSESOPHAGEAL ECHOCARDIOGRAM (TEE);  Surgeon: [name], MD;  Location: [facility] ENDOSCOPY;  Service: Cardiovascular;  Laterality: N/A;  . TEE WITHOUT CARDIOVERSION N/A 04/18/2014   Procedure: TRANSESOPHAGEAL ECHOCARDIOGRAM (TEE);  Surgeon: [name], MD;  Location: [facility] ENDOSCOPY;  Service: Cardiovascular;  Laterality: N/A;  . TEE WITHOUT CARDIOVERSION N/A 04/02/2017   Procedure: TRANSESOPHAGEAL ECHOCARDIOGRAM (TEE);  Surgeon: [name], MD;  Location: [facility] ENDOSCOPY;  Service: Cardiovascular;  Laterality: N/A;    ROS- all systems are reviewed and negatives except as per HPI above  Current Outpatient Medications  Medication Sig Dispense Refill  . amLODipine (NORVASC) 5 MG tablet TAKE 1 TABLET BY MOUTH EVERY DAY 90 tablet 2  . azelastine (OPTIVAR) 0.05 % ophthalmic solution Place 1 drop into both eyes 2 (two) times daily as needed (for eye irritation).    . Cholecalciferol (CVS D3) 50 MCG (2000 UT) CAPS Take 1 tablet by mouth daily.    Marland Kitchen DILT-XR 240 MG  24 hr capsule Take 240 mg by mouth daily.  3  . diltiazem (CARDIZEM CD) 120 MG 24 hr capsule Take 1 capsule (120 mg total) by mouth daily as needed. 180 capsule 3  . diphenhydramine-acetaminophen (TYLENOL PM) 25-500  MG TABS tablet Take 1-2 tablets by mouth at bedtime as needed (sleep.).    Marland Kitchen ELIQUIS 5 MG TABS tablet TAKE 1 TABLET BY MOUTH TWICE A DAY 60 tablet 5  . EPINEPHrine 0.3 mg/0.3 mL IJ SOAJ injection Inject 0.3 mg into the skin as needed for allergies.    . flecainide (TAMBOCOR) 150 MG tablet Take 2 tablets (300 mg total) by mouth as needed (Take 2 tablets as needed once daily for breakthrough afib). 60 tablet 11  . fluticasone (FLONASE) 50 MCG/ACT nasal spray Place 1 spray into both nostrils daily as needed for allergies.    . Glucosamine 750 MG TABS Take 750 mg by mouth daily.    . Multiple Vitamin (MULTIVITAMIN WITH MINERALS) TABS tablet Take 1 tablet by mouth daily.     . vitamin C (ASCORBIC ACID) 500 MG tablet Take 500 mg by mouth daily.    . Zinc 50 MG CAPS Take 1 tablet by mouth daily.     No current facility-administered medications for this visit.    Physical Exam: Vitals:   06/26/20 0934  BP: 122/68  Pulse: 71  SpO2: 97%  Weight: 214 lb 3.2 oz (97.2 kg)  Height: 6' (1.829 m)    GEN- The patient is well appearing, alert and oriented x 3 today.   Head- normocephalic, atraumatic Eyes-  Sclera clear, conjunctiva pink Ears- hearing intact Oropharynx- clear Lungs- Clear to ausculation bilaterally, normal work of breathing Heart- Regular rate and rhythm, no murmurs, rubs or gallops, PMI not laterally displaced GI- soft, NT, ND, + BS Extremities- no clubbing, cyanosis, or edema  Wt Readings from Last 3 Encounters:  06/26/20 214 lb 3.2 oz (97.2 kg)  12/25/19 211 lb (95.7 kg)  11/07/19 (!) 208 lb (94.3 kg)    EKG tracing ordered today is personally reviewed and shows sinus rhythm  Assessment and Plan:  1. Persistent atrial fibrillation Doing well s/p ablation off AAD therapy chads2vasc score is 2.  She is on eliquis  2. HTN Stable No change required today  3. OSA Compliant with therapy Follows with Drs Maxwell Caul and Ron Parker  Risks, benefits and potential toxicities for  medications prescribed and/or refilled reviewed with patient today.    Return to see Joseph Art in 6 months I will see in a year  [name] MD, [facility] 06/26/2020 9:49 AM

## 2020-07-16 ENCOUNTER — Other Ambulatory Visit (HOSPITAL_COMMUNITY): Payer: Self-pay | Admitting: Internal Medicine

## 2020-07-16 NOTE — Telephone Encounter (Signed)
Pt last saw Dr [name] 06/26/20, last labs 10/31/19 Creat 0.73 at [facility] per [place], age 70, weight 97.2kg, based on specified criteria pt is on appropriate dosage of Eliquis 5mg  BID.  Will refill rx.

## 2020-08-19 ENCOUNTER — Telehealth: Payer: Self-pay | Admitting: Internal Medicine

## 2020-08-19 NOTE — Telephone Encounter (Signed)
Returned call to Pt.  Pt states he has had an increase in breakthrough palpitations since last Friday.  Pt states that he has PVC's and afib.  He indicates that he can feel the difference between the rhythms.    He states last Friday he felt he was having frequent PVC's so he took a dose of flecainide.  States Saturday he went golfing and driving back he noticed he was having PVC's and then felt himself go into afib.  He did not take any medication at that time and it resolved after 30 minutes.  Saturday night he woke up at 3:30 am and felt he was having palpitations and took a dose of flecainide.  Sunday he states he was ok until about 9:00 pm when he felt palpitations again.  He cannot remember if he took anything at that time.  Pt had some confusion about his as needed medications.  We discussed that if he felt he was having PVC's he could start by taking an extra diltiazem to treat this.  If he felt that he was in afib would recommend the flecainide.  He is going to continue to monitor his palpitations this week.  This nurse will touch base with him on Friday to see if the breakthrough palpitations have subsided.

## 2020-08-19 NOTE — Telephone Encounter (Signed)
Patient c/o Palpitations:  High priority if patient c/o lightheadedness, shortness of breath, or chest pain  1) How long have you had palpitations/irregular HR/ Afib? Are you having the symptoms now? Not now, started Friday eveing  2) Are you currently experiencing lightheadedness, SOB or CP? no  3) Do you have a history of afib (atrial fibrillation) or irregular heart rhythm? yes  4) Have you checked your BP or HR? (document readings if available): HR still 65-70, has not checked BP  5) Are you experiencing any other symptoms? no  Patient states he is not in afib now, but has been going in afib the last few evenings. He states it started Friday evening, he took some flecainide and it went back to normal. He states it also happened Saturday afternoon and last night.

## 2020-08-21 ENCOUNTER — Other Ambulatory Visit: Payer: Self-pay

## 2020-08-21 ENCOUNTER — Ambulatory Visit (HOSPITAL_COMMUNITY)
Admission: RE | Admit: 2020-08-21 | Discharge: 2020-08-21 | Disposition: A | Discharge status: Home / Self Care | Payer: 59 | Source: Ambulatory Visit | Attending: Nurse Practitioner | Admitting: Nurse Practitioner

## 2020-08-21 ENCOUNTER — Encounter (HOSPITAL_COMMUNITY): Payer: Self-pay | Admitting: Nurse Practitioner

## 2020-08-21 VITALS — BP 120/68 | HR 69 | Ht 72.0 in | Wt 212.6 lb

## 2020-08-21 DIAGNOSIS — Z79899 Other long term (current) drug therapy: Secondary | ICD-10-CM | POA: Insufficient documentation

## 2020-08-21 DIAGNOSIS — Z7901 Long term (current) use of anticoagulants: Secondary | ICD-10-CM | POA: Insufficient documentation

## 2020-08-21 DIAGNOSIS — I493 Ventricular premature depolarization: Secondary | ICD-10-CM | POA: Insufficient documentation

## 2020-08-21 DIAGNOSIS — I4891 Unspecified atrial fibrillation: Secondary | ICD-10-CM | POA: Diagnosis not present

## 2020-08-21 DIAGNOSIS — I10 Essential (primary) hypertension: Secondary | ICD-10-CM | POA: Insufficient documentation

## 2020-08-21 LAB — CBC
HCT: 46.5 % (ref 39.0–52.0)
Hemoglobin: 14.7 g/dL (ref 13.0–17.0)
MCH: 26.9 pg (ref 26.0–34.0)
MCHC: 31.6 g/dL (ref 30.0–36.0)
MCV: 85 fL (ref 80.0–100.0)
Platelets: 174 10*3/uL (ref 150–400)
RBC: 5.47 MIL/uL (ref 4.22–5.81)
RDW: 15.3 % (ref 11.5–15.5)
WBC: 6.2 10*3/uL (ref 4.0–10.5)
nRBC: 0 % (ref 0.0–0.2)

## 2020-08-21 LAB — BASIC METABOLIC PANEL
Anion gap: 5 (ref 5–15)
BUN: 11 mg/dL (ref 8–23)
CO2: 29 mmol/L (ref 22–32)
Calcium: 9.3 mg/dL (ref 8.9–10.3)
Chloride: 106 mmol/L (ref 98–111)
Creatinine, Ser: 0.9 mg/dL (ref 0.61–1.24)
GFR, Estimated: >60 mL/min (ref >60)
Glucose, Bld: 118 mg/dL — ABNORMAL HIGH (ref 70–99)
Potassium: 4.2 mmol/L (ref 3.5–5.1)
Sodium: 140 mmol/L (ref 135–145)

## 2020-08-21 LAB — TSH: TSH: 0.932 u[IU]/mL (ref 0.350–4.500)

## 2020-08-21 MED ORDER — DILTIAZEM HCL 30 MG PO TABS: ORAL_TABLET | ORAL | 1 refills | Status: DC

## 2020-08-21 MED ORDER — DILTIAZEM HCL ER COATED BEADS 180 MG PO CP24: 180.0000 mg | ORAL_CAPSULE | Freq: Every day | ORAL | 1 refills | Status: DC

## 2020-08-21 MED ORDER — METOPROLOL SUCCINATE ER 25 MG PO TB24: 25.0000 mg | ORAL_TABLET | Freq: Every day | ORAL | 3 refills | Status: DC

## 2020-08-21 NOTE — Telephone Encounter (Signed)
Patient continues to have PVC's every night/evening time. When he moves around and gets his heart rate to go up the PVC's seem to go away until he sits back down and they start up again. Between 3:30 and 4 am every night since Saturday he feels like he is waking up in afib, he is using the extra flecainide nightly. The Flecainide seems to be working but it continues to occur every night and he is concerned that is may be his new normal but it is disturbing his sleep/life. The patient is at work and unable to get BP. HR is ~60-70. He is requesting an appointment this week and will bring his cardia readings. He has not miss any doses of Dilt XR or Eliquis. He is also using Cardizem CD and Flecainide PRN. Dr. Jackalyn Lombard schedule is full therefore will check with the Afib Clinic. Patient agreeable. ED precautions given.

## 2020-08-21 NOTE — Patient Instructions (Addendum)
Decrease Cardizem (Diltiazem) to 180mg  once a day  Start metoprolol (toprol) 25mg  once a day at supper time.

## 2020-08-21 NOTE — Progress Notes (Signed)
Primary Care Physician: [name], MD Referring Physician: Dr. Tim Lair is a 70 y.o. male with a h/o afib ablation x 2, HTN, that is in the afib clinic for palpitations more so since the weekend.  His strips that are brought in by pt show PVC's, that are read erroneously  by algorithm as possible afib. He has noted more around 9 pm at night as he sits down to watch TV, and he has noted when he wakes up at night to go to the bathroom. He is getting ready to go out of town on a golfing trip in 10 days. He had taken 300 mg flecainide a time or two and this does decrease the PVC's for a short while. No extra caffeine, no unusual stress, no alcohol. He has had PVC's in the past per pt.   Today, he denies symptoms of  chest pain, shortness of breath, orthopnea, PND, lower extremity edema, dizziness, presyncope, syncope, or neurologic sequela. The patient is tolerating medications without difficulties and is otherwise without complaint today.   Past Medical History:  Diagnosis Date  . Allergy to alpha-gal   . Hypertension   . Impaired fasting glucose   . Persistent atrial fibrillation ([place]) 07/10/2012   cardioversion x 2  . Pneumonia    8th grade, without recurrence  . Ureteral stone    Past Surgical History:  Procedure Laterality Date  . ATRIAL FIBRILLATION ABLATION  04/19/2014  . ATRIAL FIBRILLATION ABLATION N/A 04/19/2014   Procedure: ATRIAL FIBRILLATION ABLATION;  Surgeon: [name], MD;  Location: [facility] CATH LAB;  Service: Cardiovascular;  Laterality: N/A;  . ATRIAL FIBRILLATION ABLATION N/A 02/15/2018   Procedure: ATRIAL FIBRILLATION ABLATION;  Surgeon: [name], MD;  Location: [place] CV LAB;  Service: Cardiovascular;  Laterality: N/A;  . CARDIOVERSION N/A 07/14/2012   Procedure: CARDIOVERSION;  Surgeon: [name], MD;  Location: [place];  Service: Cardiovascular;  Laterality: N/A;  . CARDIOVERSION N/A 09/16/2012   Procedure: CARDIOVERSION;  Surgeon:  [name], MD;  Location: [facility] ENDOSCOPY;  Service: Cardiovascular;  Laterality: N/A;  . CARDIOVERSION N/A 03/14/2014   Procedure: CARDIOVERSION;  Surgeon: [name], MD;  Location: [facility] ENDOSCOPY;  Service: Cardiovascular;  Laterality: N/A;  . CARDIOVERSION N/A 04/02/2017   Procedure: CARDIOVERSION;  Surgeon: [name], MD;  Location: [place];  Service: Cardiovascular;  Laterality: N/A;  . CARDIOVERSION N/A 12/21/2017   Procedure: CARDIOVERSION;  Surgeon: [name], MD;  Location: [facility] ENDOSCOPY;  Service: Cardiovascular;  Laterality: N/A;  . CARDIOVERSION N/A 10/05/2018   Procedure: CARDIOVERSION;  Surgeon: [name], MD;  Location: [place];  Service: Cardiovascular;  Laterality: N/A;  . COLONOSCOPY WITH PROPOFOL N/A 01/31/2013   Procedure: COLONOSCOPY WITH PROPOFOL;  Surgeon: [name], MD;  Location: [facility] ENDOSCOPY;  Service: Endoscopy;  Laterality: N/A;  . FRACTURE SURGERY    . ORIF METACARPAL FRACTURE Right   . SHOULDER ARTHROSCOPY W/ ROTATOR CUFF REPAIR Bilateral   . TEE WITHOUT CARDIOVERSION N/A 07/14/2012   Procedure: TRANSESOPHAGEAL ECHOCARDIOGRAM (TEE);  Surgeon: [name], MD;  Location: [facility] ENDOSCOPY;  Service: Cardiovascular;  Laterality: N/A;  . TEE WITHOUT CARDIOVERSION N/A 09/16/2012   Procedure: TRANSESOPHAGEAL ECHOCARDIOGRAM (TEE);  Surgeon: [name], MD;  Location: [facility] ENDOSCOPY;  Service: Cardiovascular;  Laterality: N/A;  . TEE WITHOUT CARDIOVERSION N/A 04/18/2014   Procedure: TRANSESOPHAGEAL ECHOCARDIOGRAM (TEE);  Surgeon: [name], MD;  Location: [facility] ENDOSCOPY;  Service: Cardiovascular;  Laterality: N/A;  . TEE WITHOUT  CARDIOVERSION N/A 04/02/2017   Procedure: TRANSESOPHAGEAL ECHOCARDIOGRAM (TEE);  Surgeon: [name], MD;  Location: [facility] ENDOSCOPY;  Service: Cardiovascular;  Laterality: N/A;    Current Outpatient Medications  Medication Sig Dispense Refill  . amLODipine (NORVASC) 5 MG tablet TAKE 1 TABLET BY MOUTH EVERY DAY 90  tablet 2  . azelastine (OPTIVAR) 0.05 % ophthalmic solution Place 1 drop into both eyes 2 (two) times daily as needed (for eye irritation).    . cetirizine (ZYRTEC) 10 MG tablet 1 tablet    . DILT-XR 240 MG 24 hr capsule Take 240 mg by mouth daily.  3  . diltiazem (CARDIZEM CD) 120 MG 24 hr capsule Take 1 capsule (120 mg total) by mouth daily as needed. 180 capsule 3  . ELIQUIS 5 MG TABS tablet TAKE 1 TABLET BY MOUTH TWICE A DAY 60 tablet 5  . EPINEPHrine 0.3 mg/0.3 mL IJ SOAJ injection Inject 0.3 mg into the skin as needed for allergies.    . flecainide (TAMBOCOR) 150 MG tablet Take 2 tablets (300 mg total) by mouth as needed (Take 2 tablets as needed once daily for breakthrough afib). 60 tablet 11  . fluticasone (FLONASE) 50 MCG/ACT nasal spray Place 1 spray into both nostrils daily as needed for allergies.    . Glucosamine 750 MG TABS Take 750 mg by mouth daily.    . Multiple Vitamin (MULTIVITAMIN WITH MINERALS) TABS tablet Take 1 tablet by mouth daily.     . vitamin C (ASCORBIC ACID) 500 MG tablet Take 500 mg by mouth daily.    . Zinc 50 MG CAPS Take 1 tablet by mouth daily.     No current facility-administered medications for this encounter.    Allergies  Allergen Reactions  . Losartan Swelling    Angioedema  . 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 Lisinopril Swelling    angioedema  . Olmesartan Medoxomil-Hctz     Other reaction(s): swelling    Social History   Socioeconomic History  . Marital status: Married    Spouse name: Not on file  . Number of children: Not on file  . Years of education: Not on file  . Highest education level: Not on file  Occupational History  . Not on file  Tobacco Use  . Smoking status: Never Smoker  . Smokeless tobacco: Never Used  Vaping Use  . Vaping Use: Never used  Substance and Sexual Activity  . Alcohol use: Yes    Alcohol/week: 14.0 standard drinks    Types: 7 Glasses of wine, 7 Shots of liquor per week    Comment:  04/19/2014 "2-3 glasses of wine or liquor per day"  . Drug use: No  . Sexual activity: Not Currently  Other Topics Concern  . Not on file  Social History Narrative   Lives in [place] with spouse.  2 children (ages 64 and 72).   Owns [employer]   Social Determinants of Health   Financial Resource Strain: Not on file  Food Insecurity: Not on file  Transportation Needs: Not on file  Physical Activity: Not on file  Stress: Not on file  Social Connections: Not on file  Intimate Partner Violence: Not on file    Family History  Problem Relation Age of Onset  . Hypertension Mother     ROS- All systems are reviewed and negative except as per the HPI above  Physical Exam: There were no vitals filed for this visit. Wt Readings from  Last 3 Encounters:  06/26/20 97.2 kg  12/25/19 95.7 kg  11/07/19 (!) 94.3 kg    Labs: Lab Results  Component Value Date   NA 136 09/29/2018   K 3.8 09/29/2018   CL 103 09/29/2018   CO2 25 09/29/2018   GLUCOSE 95 09/29/2018   BUN 13 09/29/2018   CREATININE 0.87 09/29/2018   CALCIUM 9.2 09/29/2018   No results found for: INR No results found for: CHOL, HDL, LDLCALC, TRIG   GEN- The patient is well appearing, alert and oriented x 3 today.   Head- normocephalic, atraumatic Eyes-  Sclera clear, conjunctiva pink Ears- hearing intact Oropharynx- clear Neck- supple, no JVP Lymph- no cervical lymphadenopathy Lungs- Clear to ausculation bilaterally, normal work of breathing Heart- Regular rate and rhythm, no murmurs, rubs or gallops, PMI not laterally displaced GI- soft, NT, ND, + BS Extremities- no clubbing, cyanosis, or edema MS- no significant deformity or atrophy Skin- no rash or lesion Psych- euthymic mood, full affect Neuro- strength and sensation are intact  EKG-Sinus rhythm with first degree AV block, pr int 224 ms, qrs int 102 ms, qtc 415 ms  Strips brought in from  home show PVC's, sometimes  in a  bigeminal/trigeminal pattern   Assessment and Plan: 1. Afib S/p ablation x 2  Appears  to be quiet Home Kardia strips do  not show afib  2. PVC's  As documented on Kardia strips and by pt description  Reassured that can be aggravating but usually benign Will reduce Diltiazem to 180 mg daily and add Toprol xl  25 mg daily to blunt occurrence  I will prefer not to  add daily flecainide at this time, he has first degree AV block at baseline.  He will wear one week zio patch to further document PVC burden  Cbc/tsh/bmet today   I will call with results of the monitor, if not improving with BB, he will notify the office prior to leaving for his golf trip   [place]. Larayah Clute, [facility] [address] [place], [place] [address] [phone]

## 2020-08-21 NOTE — Telephone Encounter (Signed)
Follow Up:    Pt is still in Afib, would like to be seen   Patient c/o Palpitations:  High priority if patient c/o lightheadedness, shortness of breath, or chest pain  1) How long have you had palpitations/irregular HR/ Afib? Are you having the symptoms now? Last Friday(08-16-20)   2) Are you currently experiencing lightheadedness, SOB or CP? He does not so think so  3) Do you have a history of afib (atrial fibrillation) or irregular heart rhythm?  yes  4) Have you checked your BP or HR? (document readings if available): have not taken blood pressure  5) Are you experiencing any other symptoms? No, , pt wants to be seen

## 2020-08-21 NOTE — Telephone Encounter (Signed)
Called and spoke with patient, he is agreeable to appt today at 1:30 pm with [name], NP.

## 2020-10-08 ENCOUNTER — Other Ambulatory Visit (HOSPITAL_COMMUNITY): Payer: Self-pay | Admitting: Nurse Practitioner

## 2020-10-08 NOTE — Telephone Encounter (Signed)
This is a A-Fib clinic pt. Last seen by A-Fib clinic. Please address

## 2020-11-07 ENCOUNTER — Other Ambulatory Visit (HOSPITAL_COMMUNITY): Payer: Self-pay | Admitting: Nurse Practitioner

## 2020-11-11 ENCOUNTER — Ambulatory Visit: Payer: 59 | Admitting: Cardiology

## 2020-11-24 ENCOUNTER — Other Ambulatory Visit (HOSPITAL_COMMUNITY): Payer: Self-pay | Admitting: Nurse Practitioner

## 2020-12-06 ENCOUNTER — Other Ambulatory Visit (HOSPITAL_COMMUNITY): Payer: Self-pay | Admitting: Cardiology

## 2021-01-05 ENCOUNTER — Other Ambulatory Visit (HOSPITAL_COMMUNITY): Payer: Self-pay | Admitting: Cardiology

## 2021-01-13 ENCOUNTER — Other Ambulatory Visit (HOSPITAL_COMMUNITY): Payer: Self-pay | Admitting: Internal Medicine

## 2021-01-13 NOTE — Telephone Encounter (Signed)
Prescription refill request for Eliquis received. Indication: afib  Last office visit: Kayleen Memos 08/21/2020, [place] 08/21/2020 Scr: 0.90, 08/21/2020 Age: 70 yo  Weight: 96.4 kg   Refill sent.

## 2021-01-31 ENCOUNTER — Ambulatory Visit: Payer: 59 | Admitting: Cardiology

## 2021-03-12 ENCOUNTER — Ambulatory Visit: Payer: 59 | Admitting: Cardiology

## 2021-03-12 ENCOUNTER — Other Ambulatory Visit: Payer: Self-pay

## 2021-03-12 ENCOUNTER — Encounter: Payer: Self-pay | Admitting: Cardiology

## 2021-03-12 DIAGNOSIS — I10 Essential (primary) hypertension: Secondary | ICD-10-CM

## 2021-03-12 DIAGNOSIS — G4733 Obstructive sleep apnea (adult) (pediatric): Secondary | ICD-10-CM | POA: Diagnosis not present

## 2021-03-12 DIAGNOSIS — I4819 Other persistent atrial fibrillation: Secondary | ICD-10-CM

## 2021-03-12 DIAGNOSIS — R0789 Other chest pain: Secondary | ICD-10-CM | POA: Insufficient documentation

## 2021-03-12 NOTE — Patient Instructions (Signed)
Medication Instructions:  Your physician recommends that you continue on your current medications as directed. Please refer to the Current Medication list given to you today.   *If you need a refill on your cardiac medications before your next appointment, please call your pharmacy*   Lab Work: None ordered  If you have labs (blood work) drawn today and your tests are completely normal, you will receive your results only by: [place] (if you have MyChart) OR A paper copy in the mail If you have any lab test that is abnormal or we need to change your treatment, we will call you to review the results.   Testing/Procedures: None ordered   Follow-Up: At [facility], you and your health needs are our priority.  As part of our continuing mission to provide you with exceptional heart care, we have created designated Provider Care Teams.  These Care Teams include your primary Cardiologist (physician) and Advanced Practice Providers (APPs -  Physician Assistants and Nurse Practitioners) who all work together to provide you with the care you need, when you need it.  We recommend signing up for the patient portal called "MyChart".  Sign up information is provided on this After Visit Summary.  MyChart is used to connect with patients for Virtual Visits (Telemedicine).  Patients are able to view lab/test results, encounter notes, upcoming appointments, etc.  Non-urgent messages can be sent to your provider as well.   To learn more about what you can do with MyChart, go to [email or web address].    Your next appointment:   12 month(s)  The format for your next appointment:   In Person  Provider:   [name], MD     Other Instructions

## 2021-03-12 NOTE — Assessment & Plan Note (Signed)
Ablation x2 Dr. [name].  Overall doing well.  [name] has seen him previously in the atrial fibrillation clinic.  Had PVCs.  Explained the physiologic difference between PVCs and atrial fibrillation.  Low-dose Toprol 25 mg extended release was added.  Seems to be doing quite well.  Rarely will feel these.  Explained that these are benign.  Has flecainide for pill in the pocket approach for atrial fibrillation.  Agree that we will try to avoid long-term antiarrhythmic given his AV delay.

## 2021-03-12 NOTE — Assessment & Plan Note (Signed)
Followed by Dr. Maxwell Caul.  Doing well.

## 2021-03-12 NOTE — Progress Notes (Signed)
Cardiology Office Note:    Date:  03/12/2021   ID:  Reginald [name], DOB 1950/05/10, MRN [id number]  PCP:  [name], MD  [facility] Cardiologist:  [name], MD  [facility] Electrophysiologist:  None   Referring MD: [name], MD   History of Present Illness:    Reginald [name] is a 70 y.o. male here for the follow-up of atrial fibrillation and hypertension.  He has lost approximately 65 pounds.  He followed up with [name], NP 08/21/2020. Diltiazem was reduced to 180 mg daily and 25 mg metoprolol daily was added.  Paroxysmal atrial fibrillation post ablation by Dr. [name].  Has been doing very well without any symptoms of atrial fibrillation.  Has had prior PVCs and PACs.  Ended up having a stress test performed in March 2021, overall low risk with no ischemia.  He also had Covid in January 2021, symptoms were mild.  At his last appointment he was doing great overall. Continues with weight loss.  He is down a total of 60 pounds with decreased calorie and elliptical 5 times a week.  He has also cut out alcohol.  He does have occasional tinnitus.  Today: Overall he feels great.  He reports having "a bunch of PVC's" over the summer, occurring at 9:00 PM every night. Metoprolol was added and has helped to manage his PVCs. However, he continues to have PVC's off and on, including the other night while sitting at the [facility].  Typically his palpitations may occur every 6th or 7th beat, and then at random times such as every 15th beat. After standing up and walking around the couch twice, his palpitations seem to calm down for a short time.  His blood pressure in clinic is 144/64. He has never seen the diastolic reading this low. On recheck his BP is 128/68.  Staying active, he still has a goal of losing 10 more lbs. He stopped playing tennis and racquetball in favor of pickleball. However, he notes this has been hard on his knees.  Every morning he is  taking a Zinc supplement.  He denies any chest pain, or shortness of breath. No lightheadedness, headaches, syncope, orthopnea, PND, lower extremity edema or exertional symptoms.  In September he was able to visit the [facility]. For 3 days they explored the snowmobile trails. He plans to return to [facility] in 05/2021.  Past Medical History:  Diagnosis Date   Allergy to alpha-gal    Hypertension    Impaired fasting glucose    Persistent atrial fibrillation ([place]) 07/10/2012   cardioversion x 2   Pneumonia    8th grade, without recurrence   Ureteral stone     Past Surgical History:  Procedure Laterality Date   ATRIAL FIBRILLATION ABLATION  04/19/2014   ATRIAL FIBRILLATION ABLATION N/A 04/19/2014   Procedure: ATRIAL FIBRILLATION ABLATION;  Surgeon: [name], MD;  Location: [facility] CATH LAB;  Service: Cardiovascular;  Laterality: N/A;   ATRIAL FIBRILLATION ABLATION N/A 02/15/2018   Procedure: ATRIAL FIBRILLATION ABLATION;  Surgeon: [name], MD;  Location: [place] CV LAB;  Service: Cardiovascular;  Laterality: N/A;   CARDIOVERSION N/A 07/14/2012   Procedure: CARDIOVERSION;  Surgeon: [name], MD;  Location: [facility] ENDOSCOPY;  Service: Cardiovascular;  Laterality: N/A;   CARDIOVERSION N/A 09/16/2012   Procedure: CARDIOVERSION;  Surgeon: [name], MD;  Location: [facility] ENDOSCOPY;  Service: Cardiovascular;  Laterality: N/A;   CARDIOVERSION N/A 03/14/2014   Procedure: CARDIOVERSION;  Surgeon: [name], MD;  Location: [place];  Service: Cardiovascular;  Laterality: N/A;   CARDIOVERSION N/A 04/02/2017   Procedure: CARDIOVERSION;  Surgeon: [name], MD;  Location: [place];  Service: Cardiovascular;  Laterality: N/A;   CARDIOVERSION N/A 12/21/2017   Procedure: CARDIOVERSION;  Surgeon: [name], MD;  Location: [facility] ENDOSCOPY;  Service: Cardiovascular;  Laterality: N/A;   CARDIOVERSION N/A 10/05/2018   Procedure: CARDIOVERSION;  Surgeon: [name], MD;  Location: [facility]  ENDOSCOPY;  Service: Cardiovascular;  Laterality: N/A;   COLONOSCOPY WITH PROPOFOL N/A 01/31/2013   Procedure: COLONOSCOPY WITH PROPOFOL;  Surgeon: [name], MD;  Location: [facility] ENDOSCOPY;  Service: Endoscopy;  Laterality: N/A;   FRACTURE SURGERY     ORIF METACARPAL FRACTURE Right    SHOULDER ARTHROSCOPY W/ ROTATOR CUFF REPAIR Bilateral    TEE WITHOUT CARDIOVERSION N/A 07/14/2012   Procedure: TRANSESOPHAGEAL ECHOCARDIOGRAM (TEE);  Surgeon: [name], MD;  Location: [facility] ENDOSCOPY;  Service: Cardiovascular;  Laterality: N/A;   TEE WITHOUT CARDIOVERSION N/A 09/16/2012   Procedure: TRANSESOPHAGEAL ECHOCARDIOGRAM (TEE);  Surgeon: [name], MD;  Location: [facility] ENDOSCOPY;  Service: Cardiovascular;  Laterality: N/A;   TEE WITHOUT CARDIOVERSION N/A 04/18/2014   Procedure: TRANSESOPHAGEAL ECHOCARDIOGRAM (TEE);  Surgeon: [name], MD;  Location: [facility] ENDOSCOPY;  Service: Cardiovascular;  Laterality: N/A;   TEE WITHOUT CARDIOVERSION N/A 04/02/2017   Procedure: TRANSESOPHAGEAL ECHOCARDIOGRAM (TEE);  Surgeon: [name], MD;  Location: [facility] ENDOSCOPY;  Service: Cardiovascular;  Laterality: N/A;    Current Medications: Current Meds  Medication Sig   amLODipine (NORVASC) 5 MG tablet TAKE 1 TABLET BY MOUTH EVERY DAY   azelastine (OPTIVAR) 0.05 % ophthalmic solution Place 1 drop into both eyes 2 (two) times daily as needed (for eye irritation).   cetirizine (ZYRTEC) 10 MG tablet 1 tablet   diltiazem (CARDIZEM CD) 120 MG 24 hr capsule Take 1 capsule (120 mg total) by mouth daily as needed.   diltiazem (CARDIZEM CD) 180 MG 24 hr capsule TAKE 1 CAPSULE BY MOUTH EVERY DAY   ELIQUIS 5 MG TABS tablet TAKE 1 TABLET BY MOUTH TWICE A DAY   EPINEPHrine 0.3 mg/0.3 mL IJ SOAJ injection Inject 0.3 mg into the skin as needed for allergies.   flecainide (TAMBOCOR) 150 MG tablet Take 2 tablets (300 mg total) by mouth as needed (Take 2 tablets as needed once daily for breakthrough afib).   fluticasone (FLONASE) 50  MCG/ACT nasal spray Place 1 spray into both nostrils daily as needed for allergies.   Glucosamine 500 MG CAPS Take 500 mg by mouth daily.   metoprolol succinate (TOPROL-XL) 25 MG 24 hr tablet TAKE 1 TABLET (25 MG TOTAL) BY MOUTH DAILY AFTER SUPPER.   Multiple Vitamin (MULTIVITAMIN WITH MINERALS) TABS tablet Take 1 tablet by mouth daily.    vitamin C (ASCORBIC ACID) 500 MG tablet Take 500 mg by mouth daily.   Zinc 50 MG CAPS Take 1 tablet by mouth daily.   [DISCONTINUED] Glucosamine 750 MG TABS Take 750 mg by mouth daily.     Allergies:   Losartan, Food, Lisinopril, and Olmesartan medoxomil-hctz   Social History   Socioeconomic History   Marital status: Married    Spouse name: Not on file   Number of children: Not on file   Years of education: Not on file   Highest education level: Not on file  Occupational History   Not on file  Tobacco Use   Smoking status: Never   Smokeless tobacco: Never  Vaping Use   Vaping Use:  Never used  Substance and Sexual Activity   Alcohol use: Yes    Alcohol/week: 14.0 standard drinks    Types: 7 Glasses of wine, 7 Shots of liquor per week    Comment: 04/19/2014 "2-3 glasses of wine or liquor per day"   Drug use: No   Sexual activity: Not Currently  Other Topics Concern   Not on file  Social History Narrative   Lives in [place] with spouse.  2 children (ages 29 and 71).   Owns [employer]   Social Determinants of Health   Financial Resource Strain: Not on file  Food Insecurity: Not on file  Transportation Needs: Not on file  Physical Activity: Not on file  Stress: Not on file  Social Connections: Not on file     Family History: The patient's family history includes Hypertension in his mother.  ROS:   Please see the history of present illness.    (+) Palpitations All other systems reviewed and are negative.  EKGs/Labs/Other Studies Reviewed:    The following studies were reviewed today:  Monitor 08/2020: Sinus  rhythm Rare premature ventricular contractions A single episode of nonsustained VT is noted, lasting 5 beats Rare atrial tachycardia, longest episode 8 minutes, 9 seconds   Patient had a min HR of 40 bpm, max HR of 154 bpm, and avg HR of 63 bpm. No afib  Nuclear stress test 06/27/2019: Nuclear stress EF: 62%. Blood pressure demonstrated a normal response to exercise. There was no ST segment deviation noted during stress. The study is normal. No ischemia. This is a low risk study. The left ventricular ejection fraction is normal (55-65%).  Atrial Fibrillation Ablation 02/15/2018: CONCLUSIONS: 1. Sinus rhythm  upon presentation.   2. Intracardiac echo reveals a moderate sized left atrium without evidence of pulmonary vein stenosis. 3.  The right inferior and left inferior pulmonary veins were quiescent from a prior ablation procedure.  There was modest return of conduction within the right superior pulmonary vein and minimal return of conduction within the left superior PV. 4. As the prior ablation was relatively antral, I elected to perform a more wide atrial circumferential ablation around all 4 PVs today. 5. Additional left atrial ablation was performed with a standard box lesion created along the posterior wall of the left atrium 6. No early apparent complications.  Cardiac CTA 02/09/2018: There was mild bi atrial enlargement. 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 There was no LAA thrombus. PV anatomy was normal. Aortic root was normal at 3.7 cm   No pericardial effusion. Esophagus courses closest to the LLPV ostium   RUPV: Ostium 21.1 mm   area 3.53 cm2   RLPV:  Ostium 16.1 mm  area 2.21 cm2   LUPV:  Ostium 21.1 mm area 3.86 cm2   LLPV:  Ostium 14.7 mm  area 1.78 cm2 somewhat slit like orifice   Calcium score Calcium noted in mid and distal LAD as well as proximal RCA   IMPRESSION: 1.  No LAA thrombus   2.  Mild bi atrial enlargement   3.   Aneurysmal atrial septum   4.  Normal PV anatomy   5.  Normal aortic root 3.7 cm   6.  No pericardial effusion   7. Coronary calcium score 82 which is 47 th percentile for age and sex  Echo 04/02/2017: Study Conclusions  - Left ventricle: Systolic function was normal. No evidence of    thrombus.  - Left atrium: No evidence  of thrombus in the atrial cavity or    appendage.   Impressions:  - Successful cardioversion. No cardiac source of emboli was    indentified.   EKG:   EKG is personally reviewed and interpreted. 03/12/2021: EKG was not ordered. 08/21/2020 [name], NP): Sinus rhythm with first degree AV block, pr int 224 ms, qrs int 102 ms, qtc 415 ms. Strips brought in from home show PVC's, sometimes  in a bigeminal/trigeminal pattern  Recent Labs: 08/21/2020: BUN 11; Creatinine, Ser 0.90; Hemoglobin 14.7; Platelets 174; Potassium 4.2; Sodium 140; TSH 0.932   Recent Lipid Panel No results found for: CHOL, TRIG, HDL, CHOLHDL, VLDL, LDLCALC, LDLDIRECT  Physical Exam:    VS:  BP 128/68 (BP Location: Right Arm, Patient Position: Sitting, Cuff Size: Normal)   Pulse 63   Ht 6\' 1"  (1.854 m)   Wt 218 lb 3.2 oz (99 kg)   SpO2 97%   BMI 28.79 kg/m     Wt Readings from Last 3 Encounters:  03/12/21 218 lb 3.2 oz (99 kg)  08/21/20 212 lb 9.6 oz (96.4 kg)  06/26/20 214 lb 3.2 oz (97.2 kg)     GEN:  Well nourished, well developed in no acute distress HEENT: Normal NECK: No JVD; No carotid bruits LYMPHATICS: No lymphadenopathy CARDIAC: RRR, no murmurs, rubs, gallops RESPIRATORY:  Clear to auscultation without rales, wheezing or rhonchi  ABDOMEN: Soft, non-tender, non-distended MUSCULOSKELETAL:  No edema; No deformity  SKIN: Warm and dry NEUROLOGIC:  Alert and oriented x 3 PSYCHIATRIC:  Normal affect   ASSESSMENT:    1. Persistent atrial fibrillation ([place])   2. Primary hypertension   3. Obstructive sleep apnea   4. Atypical chest pain     PLAN:    In order  of problems listed above:  Persistent atrial fibrillation Ablation x2 Dr. [name].  Overall doing well.  [name] has seen him previously in the atrial fibrillation clinic.  Had PVCs.  Explained the physiologic difference between PVCs and atrial fibrillation.  Low-dose Toprol 25 mg extended release was added.  Seems to be doing quite well.  Rarely will feel these.  Explained that these are benign.  Has flecainide for pill in the pocket approach for atrial fibrillation.  Agree that we will try to avoid long-term antiarrhythmic given his AV delay.  HTN (hypertension) On repeat, 138/78.  Improved.  Continue to monitor.  Obstructive sleep apnea Followed by Dr. Maxwell Caul.  Doing well.  Atypical chest pain Previously evaluated demonstrating coronary artery calcification, nonobstructive CAD.  Continue with risk factor prevention.  Follow-up: 1 year.  Medication Adjustments/Labs and Tests Ordered: Current medicines are reviewed at length with the patient today.  Concerns regarding medicines are outlined above.   No orders of the defined types were placed in this encounter.  No orders of the defined types were placed in this encounter.  Patient Instructions  Medication Instructions:  Your physician recommends that you continue on your current medications as directed. Please refer to the Current Medication list given to you today.   *If you need a refill on your cardiac medications before your next appointment, please call your pharmacy*   Lab Work: None ordered  If you have labs (blood work) drawn today and your tests are completely normal, you will receive your results only by: [place] (if you have MyChart) OR A paper copy in the mail If you have any lab test that is abnormal or we need to change your treatment, we will call  you to review the results.   Testing/Procedures: None ordered   Follow-Up: At [facility], you and your health needs are our priority.  As part  of our continuing mission to provide you with exceptional heart care, we have created designated Provider Care Teams.  These Care Teams include your primary Cardiologist (physician) and Advanced Practice Providers (APPs -  Physician Assistants and Nurse Practitioners) who all work together to provide you with the care you need, when you need it.  We recommend signing up for the patient portal called "MyChart".  Sign up information is provided on this After Visit Summary.  MyChart is used to connect with patients for Virtual Visits (Telemedicine).  Patients are able to view lab/test results, encounter notes, upcoming appointments, etc.  Non-urgent messages can be sent to your provider as well.   To learn more about what you can do with MyChart, go to [email or web address].    Your next appointment:   12 month(s)  The format for your next appointment:   In Person  Provider:   [name], MD     Other Instructions    I,Mathew Stumpf,acting as a scribe for [name], MD.,have documented all relevant documentation on the behalf of [name], MD,as directed by  [name], MD while in the presence of [name], MD.  I, [name], MD, have reviewed all documentation for this visit. The documentation on 03/12/21 for the exam, diagnosis, procedures, and orders are all accurate and complete.   Signed, [name], MD  03/12/2021 5:10 PM    [facility]

## 2021-03-12 NOTE — Assessment & Plan Note (Signed)
Previously evaluated demonstrating coronary artery calcification, nonobstructive CAD.  Continue with risk factor prevention.

## 2021-03-12 NOTE — Assessment & Plan Note (Signed)
On repeat, 138/78.  Improved.  Continue to monitor.

## 2021-03-25 ENCOUNTER — Ambulatory Visit: Payer: 59 | Admitting: Cardiology

## 2021-04-22 ENCOUNTER — Encounter: Payer: Self-pay | Admitting: Cardiology

## 2021-04-22 ENCOUNTER — Telehealth: Payer: Self-pay | Admitting: Cardiology

## 2021-04-22 NOTE — Telephone Encounter (Signed)
Patient was recently diagnosed with COVID and was given Paxlovid.  He wants to know if it's okay for him to take with his medication and him having A-Fib.

## 2021-04-22 NOTE — Telephone Encounter (Signed)
Reginald [name], RPH-CPP  [name] 12 minutes ago (12:20 PM)   Reginald [name],   Please do not take anymore of Paxlovid. It interacts with your flecainide and can increase the amount of flecainide in your body increasing the risk of adverse effects. If you would like another medication for COVID please ask your doctor about molnupiravir.   It is ok to take coricidin hbp.   Reginald [name], PharmD    Thora Lance, RN routed conversation to You; Cv Div Pharmd; Jerline Pain, MD 1 hour ago (11:26 AM)   Reginald [name] Triage (supporting Jerline Pain, MD) 1 hour ago (11:23 AM)   I have Covid and yesterday was prescribed paxlovid.  I was wondering if I should take this considering the medication I take dailymainly the eliquis and the metoprolol. Also wondering about taking coricidin hbp for the symptoms in conjunction with any of this. I took first dose of the paxlovid last night and waiting to hear back before I take the morning dose. I left message with the desk first thing this morning but have not heard back.    The above information has been sent to pt via MyChart in response to his question regarding taking paxlovid.

## 2021-04-23 ENCOUNTER — Other Ambulatory Visit: Payer: Self-pay | Admitting: Internal Medicine

## 2021-05-02 ENCOUNTER — Ambulatory Visit: Payer: 59 | Admitting: Cardiology

## 2021-05-14 ENCOUNTER — Other Ambulatory Visit (HOSPITAL_COMMUNITY): Payer: Self-pay | Admitting: Internal Medicine

## 2021-05-14 DIAGNOSIS — I480 Paroxysmal atrial fibrillation: Secondary | ICD-10-CM

## 2021-05-14 NOTE — Telephone Encounter (Signed)
Eliquis 5mg  refill request received. Patient is 71 years old, weight-99kg, Crea-0.90 on 08/21/2020, Diagnosis-Afib, and last seen by Dr. Marlou Porch on 03/12/2021. Dose is appropriate based on dosing criteria. Will send in refill to requested pharmacy.

## 2021-06-30 ENCOUNTER — Ambulatory Visit: Payer: 59 | Admitting: Internal Medicine

## 2021-07-17 NOTE — Progress Notes (Deleted)
Establish ? ?

## 2021-07-18 ENCOUNTER — Encounter: Payer: Self-pay | Admitting: Cardiology

## 2021-07-18 ENCOUNTER — Ambulatory Visit: Payer: 59 | Admitting: Cardiology

## 2021-07-18 VITALS — BP 124/68 | HR 63 | Ht 73.0 in | Wt 220.4 lb

## 2021-07-18 DIAGNOSIS — I4819 Other persistent atrial fibrillation: Secondary | ICD-10-CM | POA: Diagnosis not present

## 2021-07-18 DIAGNOSIS — I10 Essential (primary) hypertension: Secondary | ICD-10-CM

## 2021-07-18 DIAGNOSIS — I493 Ventricular premature depolarization: Secondary | ICD-10-CM

## 2021-07-18 NOTE — Progress Notes (Signed)
?Electrophysiology Office Note:   ? ?Date:  07/18/2021  ? ?ID:  Reginald [name], DOB July 22, 1950, MRN [id number] ? ?PCP:  [name], MD  ?[place] Cardiologist:  [name], MD  ?[facility] Electrophysiologist:  None  ? ?Referring MD: [name], MD  ? ?Chief Complaint: Follow-up/Establish care ? ?History of Present Illness:   ? ?Reginald [name] is a 71 y.o. male who presents for follow-up and to establish care with me. Their medical history includes persistent atrial fibrillation s/p cardioversion x2 and ablation, alpha-gal allergy, hypertension, and pneumonia. ? ?Most recently seen in Cardiology by Dr. Marlou Porch 03/12/2021. He was doing well with rare PVCs, on Toprol 25 mg and flecainide with pill in the pocket approach for Afib. ? ?Previously followed by Dr. [name], last seen by him on 06/26/2020. At that visit he was doing well s/p ablation off AAD therapy, was on Eliquis. ? ?Overall, he is feeling good with no recurrent episodes of arrhythmia in 1.5 years. Usually he does know when he is in atrial fibrillation. ? ?He does experience random PVCs, mostly at night. About 1-2 years ago he was started on flecainide as needed. Since then he has only needed to take it maybe 3 times, such as when his PVCs are as often as every second beat. ? ?He remains compliant on Eliquis with no bleeding issues. ? ?Typically he is active, and enjoys golfing. While playing golf he will walk rather than ride a cart. He is planning a father/son trip to [place] soon. ? ?He denies any chest pain, shortness of breath, or peripheral edema. No lightheadedness, headaches, syncope, orthopnea, or PND. ? ?  ?Past Medical History:  ?Diagnosis Date  ? Allergy to alpha-gal   ? Hypertension   ? Impaired fasting glucose   ? Persistent atrial fibrillation ([place]) 07/10/2012  ? cardioversion x 2  ? Pneumonia   ? 8th grade, without recurrence  ? Ureteral stone   ? ? ?Past Surgical History:  ?Procedure Laterality Date  ? ATRIAL  FIBRILLATION ABLATION  04/19/2014  ? ATRIAL FIBRILLATION ABLATION N/A 04/19/2014  ? Procedure: ATRIAL FIBRILLATION ABLATION;  Surgeon: [name], MD;  Location: [facility] CATH LAB;  Service: Cardiovascular;  Laterality: N/A;  ? ATRIAL FIBRILLATION ABLATION N/A 02/15/2018  ? Procedure: ATRIAL FIBRILLATION ABLATION;  Surgeon: [name], MD;  Location: [place] [facility] CV LAB;  Service: Cardiovascular;  Laterality: N/A;  ? CARDIOVERSION N/A 07/14/2012  ? Procedure: CARDIOVERSION;  Surgeon: [name], MD;  Location: [facility] ENDOSCOPY;  Service: Cardiovascular;  Laterality: N/A;  ? CARDIOVERSION N/A 09/16/2012  ? Procedure: CARDIOVERSION;  Surgeon: [name], MD;  Location: [facility] ENDOSCOPY;  Service: Cardiovascular;  Laterality: N/A;  ? CARDIOVERSION N/A 03/14/2014  ? Procedure: CARDIOVERSION;  Surgeon: [name], MD;  Location: [facility] ENDOSCOPY;  Service: Cardiovascular;  Laterality: N/A;  ? CARDIOVERSION N/A 04/02/2017  ? Procedure: CARDIOVERSION;  Surgeon: [name], MD;  Location: [place];  Service: Cardiovascular;  Laterality: N/A;  ? CARDIOVERSION N/A 12/21/2017  ? Procedure: CARDIOVERSION;  Surgeon: [name], MD;  Location: [facility] ENDOSCOPY;  Service: Cardiovascular;  Laterality: N/A;  ? CARDIOVERSION N/A 10/05/2018  ? Procedure: CARDIOVERSION;  Surgeon: [name], MD;  Location: [facility] ENDOSCOPY;  Service: Cardiovascular;  Laterality: N/A;  ? COLONOSCOPY WITH PROPOFOL N/A 01/31/2013  ? Procedure: COLONOSCOPY WITH PROPOFOL;  Surgeon: [name], MD;  Location: [facility] ENDOSCOPY;  Service: Endoscopy;  Laterality: N/A;  ? FRACTURE SURGERY    ? ORIF METACARPAL FRACTURE Right   ?  SHOULDER ARTHROSCOPY W/ ROTATOR CUFF REPAIR Bilateral   ? TEE WITHOUT CARDIOVERSION N/A 07/14/2012  ? Procedure: TRANSESOPHAGEAL ECHOCARDIOGRAM (TEE);  Surgeon: [name], MD;  Location: [facility] ENDOSCOPY;  Service: Cardiovascular;  Laterality: N/A;  ? TEE WITHOUT CARDIOVERSION N/A 09/16/2012  ? Procedure: TRANSESOPHAGEAL ECHOCARDIOGRAM (TEE);   Surgeon: [name], MD;  Location: [facility] ENDOSCOPY;  Service: Cardiovascular;  Laterality: N/A;  ? TEE WITHOUT CARDIOVERSION N/A 04/18/2014  ? Procedure: TRANSESOPHAGEAL ECHOCARDIOGRAM (TEE);  Surgeon: [name], MD;  Location: [place];  Service: Cardiovascular;  Laterality: N/A;  ? TEE WITHOUT CARDIOVERSION N/A 04/02/2017  ? Procedure: TRANSESOPHAGEAL ECHOCARDIOGRAM (TEE);  Surgeon: [name], MD;  Location: [place];  Service: Cardiovascular;  Laterality: N/A;  ? ? ?Current Medications: ?Current Meds  ?Medication Sig  ? amLODipine (NORVASC) 5 MG tablet Take 1 tablet (5 mg total) by mouth daily. Please keep upcoming appt in April 2023 with Dr. Quentin Ore before anymore refills. Thank you  ? azelastine (OPTIVAR) 0.05 % ophthalmic solution Place 1 drop into both eyes 2 (two) times daily as needed (for eye irritation).  ? cetirizine (ZYRTEC) 10 MG tablet 1 tablet  ? diltiazem (CARDIZEM CD) 120 MG 24 hr capsule Take 1 capsule (120 mg total) by mouth daily as needed.  ? diltiazem (CARDIZEM CD) 180 MG 24 hr capsule TAKE 1 CAPSULE BY MOUTH EVERY DAY  ? ELIQUIS 5 MG TABS tablet TAKE 1 TABLET BY MOUTH TWICE A DAY  ? EPINEPHrine 0.3 mg/0.3 mL IJ SOAJ injection Inject 0.3 mg into the skin as needed for allergies.  ? flecainide (TAMBOCOR) 150 MG tablet Take 2 tablets (300 mg total) by mouth as needed (Take 2 tablets as needed once daily for breakthrough afib).  ? fluticasone (FLONASE) 50 MCG/ACT nasal spray Place 1 spray into both nostrils daily as needed for allergies.  ? Glucosamine 500 MG CAPS Take 500 mg by mouth daily.  ? metoprolol succinate (TOPROL-XL) 25 MG 24 hr tablet TAKE 1 TABLET (25 MG TOTAL) BY MOUTH DAILY AFTER SUPPER.  ? Multiple Vitamin (MULTIVITAMIN WITH MINERALS) TABS tablet Take 1 tablet by mouth daily.   ? vitamin C (ASCORBIC ACID) 500 MG tablet Take 500 mg by mouth daily.  ? Zinc 50 MG CAPS Take 1 tablet by mouth daily.  ?  ? ?Allergies:   Losartan, Food, Lisinopril, and Olmesartan  medoxomil-hctz  ? ?Social History  ? ?Socioeconomic History  ? Marital status: Married  ?  Spouse name: Not on file  ? Number of children: Not on file  ? Years of education: Not on file  ? Highest education level: Not on file  ?Occupational History  ? Not on file  ?Tobacco Use  ? Smoking status: Never  ? Smokeless tobacco: Never  ?Vaping Use  ? Vaping Use: Never used  ?Substance and Sexual Activity  ? Alcohol use: Yes  ?  Alcohol/week: 14.0 standard drinks  ?  Types: 7 Glasses of wine, 7 Shots of liquor per week  ?  Comment: 04/19/2014 "2-3 glasses of wine or liquor per day"  ? Drug use: No  ? Sexual activity: Not Currently  ?Other Topics Concern  ? Not on file  ?Social History Narrative  ? Lives in [place] with spouse.  2 children (ages 72 and 54).  ? Owns [employer]  ? ?Social Determinants of Health  ? ?Financial Resource Strain: Not on file  ?Food Insecurity: Not on file  ?Transportation Needs: Not on file  ?Physical Activity: Not on file  ?  Stress: Not on file  ?Social Connections: Not on file  ?  ? ?Family History: ?The patient's family history includes Hypertension in his mother. ? ?ROS:   ?Please see the history of present illness.    ?(+) Palpitations ?All other systems reviewed and are negative. ? ?EKGs/Labs/Other Studies Reviewed:   ? ?The following studies were reviewed today: ? ?Monitor 10/2020: ?Sinus rhythm ?Rare premature ventricular contractions ?A single episode of nonsustained VT is noted, lasting 5 beats ?Rare atrial tachycardia, longest episode 8 minutes, 9 seconds ?  ?Patient had a min HR of 40 bpm, max HR of 154 bpm, and avg HR of 63 bpm. ?No afib ? ?Lexiscan Myoview 06/27/2019: ?Nuclear stress EF: 62%. ?Blood pressure demonstrated a normal response to exercise. ?There was no ST segment deviation noted during stress. ?The study is normal. No ischemia. ?This is a low risk study. ?The left ventricular ejection fraction is normal (55-65%). ? ?Atrial Fibrillation Ablation  02/15/2018: ?CONCLUSIONS: ?1. Sinus rhythm  upon presentation.   ?2. Intracardiac echo reveals a moderate sized left atrium without evidence of pulmonary vein stenosis. ?3.  The right inferior and left inferior pulmonary veins were quiescen

## 2021-07-18 NOTE — Patient Instructions (Signed)

## 2021-08-26 ENCOUNTER — Telehealth: Payer: Self-pay | Admitting: Cardiology

## 2021-08-26 NOTE — Telephone Encounter (Signed)
Spoke with pt who states he woke in Afib this am.  He denies current symptoms of CP, SOB, fatigue or dizziness.  He states he "might be slightly lightheaded."   He has taken his morning medications and has also taken his Flecainide '150mg'$  - 2 tablets.  He has not taken his BP or HR but estimates HR to be about 100.   ?Pt advised will forward to Dr Quentin Ore for review and recommendation.  Pt states he prefers early cardioversion.  Pt advised he is taking correct medications for Afib and should give the Flecainide the opportunity to see if he will convert on his own.  Reviewed ED precautions.  Pt verbalizes understanding and agrees with current plan.   ?

## 2021-08-26 NOTE — Telephone Encounter (Signed)
Patient c/o Palpitations:  High priority if patient c/o lightheadedness, shortness of breath, or chest pain ? ?How long have you had palpitations/irregular HR/ Afib? Has been in it since 7am. Woke up in A-Fib   Are you having the symptoms now? yes ? ?Are you currently experiencing lightheadedness, SOB or CP? no ? ?Do you have a history of afib (atrial fibrillation) or irregular heart rhythm? Yes, history of A-Fib ? ?Have you checked your BP or HR? (document readings if available): no, he didn't  ? ?Are you experiencing any other symptoms? No other symptoms just in A-Fib.   ?

## 2021-08-28 NOTE — Telephone Encounter (Signed)
Spoke with pt who reports per his [facility] he is back in normal rhythm.  Pt advised to continue current medications and contact office for any further needs or questions.  Pt verbalizes understanding and thanked Therapist, sports for the call.

## 2021-10-03 ENCOUNTER — Other Ambulatory Visit (HOSPITAL_COMMUNITY): Payer: Self-pay | Admitting: Cardiology

## 2021-10-03 ENCOUNTER — Other Ambulatory Visit (HOSPITAL_COMMUNITY): Payer: Self-pay | Admitting: Internal Medicine

## 2021-10-16 ENCOUNTER — Other Ambulatory Visit: Payer: Self-pay | Admitting: Cardiology

## 2021-11-26 ENCOUNTER — Telehealth: Payer: Self-pay | Admitting: *Deleted

## 2021-11-26 DIAGNOSIS — R931 Abnormal findings on diagnostic imaging of heart and coronary circulation: Secondary | ICD-10-CM

## 2021-11-26 NOTE — Telephone Encounter (Signed)
   Pre-operative Risk Assessment    Patient Name: Reginald [name]  DOB: 06-16-1950 MRN: [id number]      Request for Surgical Clearance    Procedure:   COLONOSCOPY  Date of Surgery:  Clearance TBD                                 Surgeon:  DR. Watt Climes Surgeon's Group or Practice Name:  [facility] Phone number:  [phone] Fax number:  [phone]   Type of Clearance Requested:   - Medical  - Pharmacy:  Hold Apixaban (Eliquis)     Type of Anesthesia:  Not Indicated (PROPOFOL?)   Additional requests/questions:    [name]   11/26/2021, 9:21 AM

## 2021-11-26 NOTE — Telephone Encounter (Signed)
Tele appt pt agreeable to plan of care 11/27/21 at 3:20. Med rec and consent are done.

## 2021-11-26 NOTE — Telephone Encounter (Signed)
Tele appt pt agreeable to plan of care 11/27/21 at 3:20. Med rec and consent are done.      Patient Consent for Virtual Visit        Reginald [name] has provided verbal consent on 11/26/2021 for a virtual visit (video or telephone).   CONSENT FOR VIRTUAL VISIT FOR:  Reginald [name]  By participating in this virtual visit I agree to the following:  I hereby voluntarily request, consent and authorize [facility] and its employed or contracted physicians, physician assistants, nurse practitioners or other licensed health care professionals (the Practitioner), to provide me with telemedicine health care services (the "Services") as deemed necessary by the treating Practitioner. I acknowledge and consent to receive the Services by the Practitioner via telemedicine. I understand that the telemedicine visit will involve communicating with the Practitioner through live audiovisual communication technology and the disclosure of certain medical information by electronic transmission. I acknowledge that I have been given the opportunity to request an in-person assessment or other available alternative prior to the telemedicine visit and am voluntarily participating in the telemedicine visit.  I understand that I have the right to withhold or withdraw my consent to the use of telemedicine in the course of my care at any time, without affecting my right to future care or treatment, and that the Practitioner or I may terminate the telemedicine visit at any time. I understand that I have the right to inspect all information obtained and/or recorded in the course of the telemedicine visit and may receive copies of available information for a reasonable fee.  I understand that some of the potential risks of receiving the Services via telemedicine include:  Delay or interruption in medical evaluation due to technological equipment failure or disruption; Information transmitted may not be sufficient (e.g.  poor resolution of images) to allow for appropriate medical decision making by the Practitioner; and/or  In rare instances, security protocols could fail, causing a breach of personal health information.  Furthermore, I acknowledge that it is my responsibility to provide information about my medical history, conditions and care that is complete and accurate to the best of my ability. I acknowledge that Practitioner's advice, recommendations, and/or decision may be based on factors not within their control, such as incomplete or inaccurate data provided by me or distortions of diagnostic images or specimens that may result from electronic transmissions. I understand that the practice of medicine is not an exact science and that Practitioner makes no warranties or guarantees regarding treatment outcomes. I acknowledge that a copy of this consent can be made available to me via my patient portal ([facility]), or I can request a printed copy by calling the office of [facility].    I understand that my insurance will be billed for this visit.   I have read or had this consent read to me. I understand the contents of this consent, which adequately explains the benefits and risks of the Services being provided via telemedicine.  I have been provided ample opportunity to ask questions regarding this consent and the Services and have had my questions answered to my satisfaction. I give my informed consent for the services to be provided through the use of telemedicine in my medical care

## 2021-11-26 NOTE — Telephone Encounter (Signed)
Patient with diagnosis of afib on Eliquis for anticoagulation.    Procedure: colonoscopy Date of procedure: TBD  CHA2DS2-VASc Score = 3  This indicates a 3.2% annual risk of stroke. The patient's score is based upon: CHF History: 0 HTN History: 1 Diabetes History: 0 Stroke History: 0 Vascular Disease History: 1 Age Score: 1 Gender Score: 0   CrCl >167m/min Platelet count 174K  Per office protocol, patient can hold Eliquis for 1-2 days prior to procedure.    **This guidance is not considered finalized until pre-operative APP has relayed final recommendations.**

## 2021-11-26 NOTE — Telephone Encounter (Signed)
   Name: Reginald [name]  DOB: Oct 20, 1950  MRN: [id number]  Primary Cardiologist: [name], MD   Preoperative team, please contact this patient and set up a phone call appointment for further preoperative risk assessment. Please obtain consent and complete medication review. Thank you for your help.  I confirm that guidance regarding antiplatelet and oral anticoagulation therapy has been completed and, if necessary, noted below.  Patient with diagnosis of afib on Eliquis for anticoagulation.     Procedure: colonoscopy Date of procedure: TBD   CHA2DS2-VASc Score = 3  This indicates a 3.2% annual risk of stroke. The patient's score is based upon: CHF History: 0 HTN History: 1 Diabetes History: 0 Stroke History: 0 Vascular Disease History: 1 Age Score: 1 Gender Score: 0   CrCl >143m/min Platelet count 174K   Per office protocol, patient can hold Eliquis for 1-2 days prior to procedure.    ELenna Sciara NP 11/26/2021, 2:36 PM [place]

## 2021-11-27 ENCOUNTER — Ambulatory Visit (INDEPENDENT_AMBULATORY_CARE_PROVIDER_SITE_OTHER): Payer: 59 | Admitting: Nurse Practitioner

## 2021-11-27 DIAGNOSIS — Z01810 Encounter for preprocedural cardiovascular examination: Secondary | ICD-10-CM | POA: Diagnosis not present

## 2021-11-27 NOTE — Progress Notes (Signed)
Virtual Visit via Telephone Note   Because of Reginald [name]'s co-morbid illnesses, he is at least at moderate risk for complications without adequate follow up.  This format is felt to be most appropriate for this patient at this time.  The patient did not have access to video technology/had technical difficulties with video requiring transitioning to audio format only (telephone).  All issues noted in this document were discussed and addressed.  No physical exam could be performed with this format.  Please refer to the patient's chart for his consent to telehealth for [facility].  Evaluation Performed:  Preoperative cardiovascular risk assessment _____________   Date:  11/27/2021   Patient ID:  Reginald [name], DOB 1950/11/22, MRN [id number] Patient Location:  Home Provider location:   Office  Primary Care Provider:  [name], MD Primary Cardiologist:  [name], MD  Chief Complaint / Patient Profile   71 y.o. y/o male with a h/o persistent atrial fibrillation, PVCs, and hypertension, who is pending colonoscopy, date TBD, with Dr. Watt Climes of [facility] and presents today for telephonic preoperative cardiovascular risk assessment.  Past Medical History    Past Medical History:  Diagnosis Date   Allergy to alpha-gal    Hypertension    Impaired fasting glucose    Persistent atrial fibrillation ([place]) 07/10/2012   cardioversion x 2   Pneumonia    8th grade, without recurrence   Ureteral stone    Past Surgical History:  Procedure Laterality Date   ATRIAL FIBRILLATION ABLATION  04/19/2014   ATRIAL FIBRILLATION ABLATION N/A 04/19/2014   Procedure: ATRIAL FIBRILLATION ABLATION;  Surgeon: [name], MD;  Location: [facility] CATH LAB;  Service: Cardiovascular;  Laterality: N/A;   ATRIAL FIBRILLATION ABLATION N/A 02/15/2018   Procedure: ATRIAL FIBRILLATION ABLATION;  Surgeon: [name], MD;  Location: [place] CV LAB;  Service: Cardiovascular;  Laterality: N/A;    CARDIOVERSION N/A 07/14/2012   Procedure: CARDIOVERSION;  Surgeon: [name], MD;  Location: [place];  Service: Cardiovascular;  Laterality: N/A;   CARDIOVERSION N/A 09/16/2012   Procedure: CARDIOVERSION;  Surgeon: [name], MD;  Location: [place];  Service: Cardiovascular;  Laterality: N/A;   CARDIOVERSION N/A 03/14/2014   Procedure: CARDIOVERSION;  Surgeon: [name], MD;  Location: [facility] ENDOSCOPY;  Service: Cardiovascular;  Laterality: N/A;   CARDIOVERSION N/A 04/02/2017   Procedure: CARDIOVERSION;  Surgeon: [name], MD;  Location: [place];  Service: Cardiovascular;  Laterality: N/A;   CARDIOVERSION N/A 12/21/2017   Procedure: CARDIOVERSION;  Surgeon: [name], MD;  Location: [facility] ENDOSCOPY;  Service: Cardiovascular;  Laterality: N/A;   CARDIOVERSION N/A 10/05/2018   Procedure: CARDIOVERSION;  Surgeon: [name], MD;  Location: [place];  Service: Cardiovascular;  Laterality: N/A;   COLONOSCOPY WITH PROPOFOL N/A 01/31/2013   Procedure: COLONOSCOPY WITH PROPOFOL;  Surgeon: [name], MD;  Location: [facility] ENDOSCOPY;  Service: Endoscopy;  Laterality: N/A;   FRACTURE SURGERY     ORIF METACARPAL FRACTURE Right    SHOULDER ARTHROSCOPY W/ ROTATOR CUFF REPAIR Bilateral    TEE WITHOUT CARDIOVERSION N/A 07/14/2012   Procedure: TRANSESOPHAGEAL ECHOCARDIOGRAM (TEE);  Surgeon: [name], MD;  Location: [facility] ENDOSCOPY;  Service: Cardiovascular;  Laterality: N/A;   TEE WITHOUT CARDIOVERSION N/A 09/16/2012   Procedure: TRANSESOPHAGEAL ECHOCARDIOGRAM (TEE);  Surgeon: [name], MD;  Location: [facility] ENDOSCOPY;  Service: Cardiovascular;  Laterality: N/A;   TEE WITHOUT CARDIOVERSION N/A 04/18/2014   Procedure: TRANSESOPHAGEAL ECHOCARDIOGRAM (TEE);  Surgeon: [name], MD;  Location: [facility]  ENDOSCOPY;  Service: Cardiovascular;  Laterality: N/A;   TEE WITHOUT CARDIOVERSION N/A 04/02/2017   Procedure: TRANSESOPHAGEAL ECHOCARDIOGRAM (TEE);  Surgeon: [name], MD;   Location: [facility] ENDOSCOPY;  Service: Cardiovascular;  Laterality: N/A;    Allergies  Allergies  Allergen Reactions   Losartan Swelling    Angioedema   Food     Alpha-Gal Syndrome--NO MAMMAL MEAT   Lisinopril Swelling    angioedema   Olmesartan Medoxomil-Hctz     Other reaction(s): swelling    History of Present Illness    Reginald [name] is a 71 y.o. male who presents via audio/video conferencing for a telehealth visit today.  Pt was last seen in cardiology clinic on 07/18/2021 by Dr. Quentin Ore.  At that time Reginald [name] was doing well.  The patient is now pending procedure as outlined above. Since his last visit, he has done well from a cardiac standpoint. He denies chest pain, palpitations, dyspnea, pnd, orthopnea, n, v, dizziness, syncope, edema, weight gain, or early satiety. All other systems reviewed and are otherwise negative except as noted above.   Home Medications    Prior to Admission medications   Medication Sig Start Date End Date Taking? Authorizing Provider  amLODipine (NORVASC) 5 MG tablet TAKE 1 TABLET (5 MG TOTAL) BY MOUTH DAILY. PLEASE KEEP UPCOMING APPT IN APRIL 2023 WITH DR. Quentin Ore BEFORE ANYMORE REFILLS. Buford YOU 10/16/21   Jerline Pain, MD  azelastine (OPTIVAR) 0.05 % ophthalmic solution Place 1 drop into both eyes 2 (two) times daily as needed (for eye irritation).    [provider]  cetirizine (ZYRTEC) 10 MG tablet 1 tablet    [provider]  Cholecalciferol (VITAMIN D3) 25 MCG (1000 UT) CAPS Take 2 capsules by mouth daily.    [provider]  diltiazem (CARDIZEM CD) 120 MG 24 hr capsule Take 1 capsule (120 mg total) by mouth daily as needed. 09/29/18   Fenton, Clint R, PA  diltiazem (CARDIZEM CD) 180 MG 24 hr capsule TAKE 1 CAPSULE BY MOUTH EVERY DAY 10/03/21   Jerline Pain, MD  ELIQUIS 5 MG TABS tablet TAKE 1 TABLET BY MOUTH TWICE A DAY 05/14/21   Jerline Pain, MD  EPINEPHrine 0.3 mg/0.3 mL IJ SOAJ injection Inject 0.3 mg  into the skin as needed for allergies. 04/28/19   [provider]  flecainide (TAMBOCOR) 150 MG tablet Take 2 tablets (300 mg total) by mouth as needed (Take 2 tablets as needed once daily for breakthrough afib). 12/26/18   Allred, Jeneen Rinks, MD  fluticasone (FLONASE) 50 MCG/ACT nasal spray Place 1 spray into both nostrils daily as needed for allergies.    [provider]  Glucosamine 500 MG CAPS Take 500 mg by mouth daily.    [provider]  metoprolol succinate (TOPROL-XL) 25 MG 24 hr tablet TAKE 1 TABLET (25 MG TOTAL) BY MOUTH DAILY AFTER SUPPER. 10/03/21 10/03/22  [name], MD  Multiple Vitamin (MULTIVITAMIN WITH MINERALS) TABS tablet Take 1 tablet by mouth daily.     [provider]  vitamin C (ASCORBIC ACID) 500 MG tablet Take 500 mg by mouth daily.    [provider]  Zinc 50 MG CAPS Take 1 tablet by mouth daily. 04/18/19   [provider]    Physical Exam    Vital Signs:  Reginald [name] does not have vital signs available for review today.  Given telephonic nature of communication, physical exam is limited. AAOx3. NAD. Normal affect.  Speech and respirations are unlabored.  Accessory Clinical Findings    None  Assessment & Plan    1.  Preoperative Cardiovascular Risk Assessment:  According to the Revised Cardiac Risk Index (RCRI), his Perioperative Risk of Major Cardiac Event is (%): 0.4. His Functional Capacity in METs is: 8.97 according to the Duke Activity Status Index (DASI). Therefore, based on ACC/AHA guidelines, patient would be at acceptable risk for the planned procedure without further cardiovascular testing.  Patient with diagnosis of afib on Eliquis for anticoagulation.     Procedure: colonoscopy Date of procedure: TBD   CHA2DS2-VASc Score = 3  This indicates a 3.2% annual risk of stroke. The patient's score is based upon: CHF History: 0 HTN History: 1 Diabetes History: 0 Stroke History: 0 Vascular  Disease History: 1 Age Score: 1 Gender Score: 0   CrCl >1107m/min Platelet count 174K   Per office protocol, patient can hold Eliquis for 1-2 days prior to procedure.     A copy of this note will be routed to requesting surgeon.  Time:   Today, I have spent 4 minutes with the patient with telehealth technology discussing medical history, symptoms, and management plan.     ELenna Sciara NP  11/27/2021, 3:34 PM

## 2021-12-08 ENCOUNTER — Other Ambulatory Visit (HOSPITAL_COMMUNITY): Payer: Self-pay | Admitting: Cardiology

## 2021-12-08 ENCOUNTER — Telehealth: Payer: Self-pay | Admitting: Internal Medicine

## 2021-12-08 DIAGNOSIS — I480 Paroxysmal atrial fibrillation: Secondary | ICD-10-CM

## 2021-12-08 MED ORDER — APIXABAN 5 MG PO TABS: 5.0000 mg | ORAL_TABLET | Freq: Two times a day (BID) | ORAL | 1 refills | Status: DC

## 2021-12-08 NOTE — Telephone Encounter (Signed)
Eliquis '5mg'$  refill request received. Patient is 71 years old, weight-100kg, Crea-0.68 on 10/07/2021 via [facility] from [place], [place], and last seen by Diona Browner on 11/27/21. Dose is appropriate based on dosing criteria. Will send in refill to requested pharmacy.

## 2021-12-08 NOTE — Telephone Encounter (Signed)
Prescription refill request for Eliquis received. Indication:Afib Last office visit:8/23 Scr:0.6 Age: 71 Weight:100 kg  Prescription refilled

## 2021-12-08 NOTE — Telephone Encounter (Signed)
*  STAT* If patient is at the pharmacy, call can be transferred to refill team.   1. Which medications need to be refilled? (please list name of each medication and dose if known)   ELIQUIS 5 MG TABS tablet    2. Which pharmacy/location (including street and city if local pharmacy) is medication to be sent to? [id number]- [place], [place] - [address] [address] Do they need a 30 day or 90 day supply? 930 Pt going out of town tomorrow morning and needs refill today

## 2022-01-22 ENCOUNTER — Telehealth: Payer: Self-pay | Admitting: *Deleted

## 2022-01-22 ENCOUNTER — Telehealth: Payer: Self-pay | Admitting: Cardiology

## 2022-01-22 NOTE — Telephone Encounter (Signed)
Pt wants to know how many days to hold his eliquis before his upcoming colonoscopy.

## 2022-01-22 NOTE — Telephone Encounter (Signed)
I s/w the pt and advised he could hold his Eliquis x 1-2 days prior the notes from Diona Browner, [facility] for pre op clearance. I gave the pt the dates that he will hold for procedure on 02/03/22. Pt aware to take Eliquis on 01/31/22, he will HOLD Eliquis on 10/22 & 10/23, procedure 02/03/22 and resume once Dr. Watt Climes gives the ok.   Pt also states he has a new grand baby coming in 03/2022. He states the hospital said that he needs to have a Tdapt before he will be able to go see the baby after delivery. He wants to make sure it is safe for him to get this vaccine. I assured the pt that I will send a message to Dr. Marlou Porch and his nurse Jeannene Patella will call back with any recommendations if any.  Pt thanked me for the help.

## 2022-01-22 NOTE — Telephone Encounter (Signed)
Pt also states he has a new grand baby coming in 03/2022. He states the hospital said that he needs to have a Tdapt before he will be able to go see the baby after delivery. He wants to make sure it is safe for him to get this vaccine. I assured the pt that I will send a message to Dr. Marlou Porch and his nurse Jeannene Patella will call back with any recommendations if any.  Pt thanked me for the help.

## 2022-01-26 NOTE — Telephone Encounter (Signed)
Safe  [name], MD   Spoke with pt who is aware.  He had not further questions at this time.

## 2022-04-11 ENCOUNTER — Other Ambulatory Visit: Payer: Self-pay | Admitting: Cardiology

## 2022-04-22 ENCOUNTER — Ambulatory Visit: Payer: 59 | Attending: Cardiology | Admitting: Cardiology

## 2022-04-22 ENCOUNTER — Encounter: Payer: Self-pay | Admitting: Cardiology

## 2022-04-22 VITALS — BP 122/60 | HR 68 | Ht 72.0 in | Wt 225.0 lb

## 2022-04-22 DIAGNOSIS — R931 Abnormal findings on diagnostic imaging of heart and coronary circulation: Secondary | ICD-10-CM | POA: Diagnosis not present

## 2022-04-22 DIAGNOSIS — I480 Paroxysmal atrial fibrillation: Secondary | ICD-10-CM

## 2022-04-22 NOTE — Progress Notes (Signed)
Cardiology Office Note:    Date:  04/22/2022   ID:  Reginald [name], DOB 02/11/1951, MRN [id number]  PCP:  [name], MD  [facility] Cardiologist:  [name], MD  [facility] Electrophysiologist:  [name], MD   Referring MD: [name], MD   History of Present Illness:    Reginald [name] is a 72 y.o. male here for the follow-up of atrial fibrillation post ablation x 2 by Dr. [name] last 1 being in 2019 and hypertension.  He has lost approximately 65 pounds.  He followed up with [name], NP 08/21/2020. Diltiazem was reduced to 180 mg daily and 25 mg metoprolol daily after supper was added secondary to PVCs noted at night.  Ended up having a stress test performed in March 2021, overall low risk with no ischemia.  He also had Covid in January 2021, symptoms were mild.  At prior appointment he was doing great overall. Continues with weight loss.  He is down a total of 60 pounds with decreased calorie and elliptical 5 times a week.  He has also cut out alcohol.  He does have occasional tinnitus.  Feels well.  Has been on father's on golf trips to [place] and [place].  Denies any chest pain fevers chills nausea vomiting.  Palpitations/PVCs have been quiescent since adding metoprolol after supper.  Past Medical History:  Diagnosis Date   Allergy to alpha-gal    Hypertension    Impaired fasting glucose    Persistent atrial fibrillation ([place]) 07/10/2012   cardioversion x 2   Pneumonia    8th grade, without recurrence   Ureteral stone     Past Surgical History:  Procedure Laterality Date   ATRIAL FIBRILLATION ABLATION  04/19/2014   ATRIAL FIBRILLATION ABLATION N/A 04/19/2014   Procedure: ATRIAL FIBRILLATION ABLATION;  Surgeon: [name], MD;  Location: [facility] CATH LAB;  Service: Cardiovascular;  Laterality: N/A;   ATRIAL FIBRILLATION ABLATION N/A 02/15/2018   Procedure: ATRIAL FIBRILLATION ABLATION;  Surgeon: [name], MD;  Location: [place] CV  LAB;  Service: Cardiovascular;  Laterality: N/A;   CARDIOVERSION N/A 07/14/2012   Procedure: CARDIOVERSION;  Surgeon: [name], MD;  Location: [place];  Service: Cardiovascular;  Laterality: N/A;   CARDIOVERSION N/A 09/16/2012   Procedure: CARDIOVERSION;  Surgeon: [name], MD;  Location: [place];  Service: Cardiovascular;  Laterality: N/A;   CARDIOVERSION N/A 03/14/2014   Procedure: CARDIOVERSION;  Surgeon: [name], MD;  Location: [facility] ENDOSCOPY;  Service: Cardiovascular;  Laterality: N/A;   CARDIOVERSION N/A 04/02/2017   Procedure: CARDIOVERSION;  Surgeon: [name], MD;  Location: [place];  Service: Cardiovascular;  Laterality: N/A;   CARDIOVERSION N/A 12/21/2017   Procedure: CARDIOVERSION;  Surgeon: [name], MD;  Location: [facility] ENDOSCOPY;  Service: Cardiovascular;  Laterality: N/A;   CARDIOVERSION N/A 10/05/2018   Procedure: CARDIOVERSION;  Surgeon: [name], MD;  Location: [place];  Service: Cardiovascular;  Laterality: N/A;   COLONOSCOPY WITH PROPOFOL N/A 01/31/2013   Procedure: COLONOSCOPY WITH PROPOFOL;  Surgeon: [name], MD;  Location: [facility] ENDOSCOPY;  Service: Endoscopy;  Laterality: N/A;   FRACTURE SURGERY     ORIF METACARPAL FRACTURE Right    SHOULDER ARTHROSCOPY W/ ROTATOR CUFF REPAIR Bilateral    TEE WITHOUT CARDIOVERSION N/A 07/14/2012   Procedure: TRANSESOPHAGEAL ECHOCARDIOGRAM (TEE);  Surgeon: [name], MD;  Location: [facility] ENDOSCOPY;  Service: Cardiovascular;  Laterality: N/A;   TEE WITHOUT CARDIOVERSION N/A 09/16/2012   Procedure: TRANSESOPHAGEAL ECHOCARDIOGRAM (TEE);  Surgeon: [name], MD;  Location: [facility] ENDOSCOPY;  Service: Cardiovascular;  Laterality: N/A;   TEE WITHOUT CARDIOVERSION N/A 04/18/2014   Procedure: TRANSESOPHAGEAL ECHOCARDIOGRAM (TEE);  Surgeon: [name], MD;  Location: [facility] ENDOSCOPY;  Service: Cardiovascular;  Laterality: N/A;   TEE WITHOUT CARDIOVERSION N/A 04/02/2017   Procedure: TRANSESOPHAGEAL  ECHOCARDIOGRAM (TEE);  Surgeon: [name], MD;  Location: [facility] ENDOSCOPY;  Service: Cardiovascular;  Laterality: N/A;    Current Medications: Current Meds  Medication Sig   amLODipine (NORVASC) 5 MG tablet TAKE 1 TABLET (5 MG TOTAL) BY MOUTH DAILY   apixaban (ELIQUIS) 5 MG TABS tablet Take 1 tablet (5 mg total) by mouth 2 (two) times daily.   azelastine (OPTIVAR) 0.05 % ophthalmic solution Place 1 drop into both eyes 2 (two) times daily as needed (for eye irritation).   cetirizine (ZYRTEC) 10 MG tablet as needed for allergies or rhinitis.   Cholecalciferol (VITAMIN D3) 25 MCG (1000 UT) CAPS Take 2 capsules by mouth daily.   diltiazem (CARDIZEM CD) 120 MG 24 hr capsule Take 1 capsule (120 mg total) by mouth daily as needed.   diltiazem (CARDIZEM CD) 180 MG 24 hr capsule TAKE 1 CAPSULE BY MOUTH EVERY DAY   EPINEPHrine 0.3 mg/0.3 mL IJ SOAJ injection Inject 0.3 mg into the skin as needed for allergies.   flecainide (TAMBOCOR) 150 MG tablet Take 2 tablets (300 mg total) by mouth as needed (Take 2 tablets as needed once daily for breakthrough afib).   fluticasone (FLONASE) 50 MCG/ACT nasal spray Place 1 spray into both nostrils daily as needed for allergies.   Glucosamine 500 MG CAPS Take 500 mg by mouth daily.   metoprolol succinate (TOPROL-XL) 25 MG 24 hr tablet TAKE 1 TABLET (25 MG TOTAL) BY MOUTH DAILY AFTER SUPPER.   Multiple Vitamin (MULTIVITAMIN WITH MINERALS) TABS tablet Take 1 tablet by mouth daily.    vitamin C (ASCORBIC ACID) 500 MG tablet Take 500 mg by mouth daily.   Zinc 50 MG CAPS Take 1 tablet by mouth daily.     Allergies:   Losartan, Food, Lisinopril, and Olmesartan medoxomil-hctz   Social History   Socioeconomic History   Marital status: Married    Spouse name: Not on file   Number of children: Not on file   Years of education: Not on file   Highest education level: Not on file  Occupational History   Not on file  Tobacco Use   Smoking status: Never   Smokeless  tobacco: Never  Vaping Use   Vaping Use: Never used  Substance and Sexual Activity   Alcohol use: Yes    Alcohol/week: 14.0 standard drinks of alcohol    Types: 7 Glasses of wine, 7 Shots of liquor per week    Comment: 04/19/2014 "2-3 glasses of wine or liquor per day"   Drug use: No   Sexual activity: Not Currently  Other Topics Concern   Not on file  Social History Narrative   Lives in [place] with spouse.  2 children (ages 34 and 65).   Owns [employer]   Social Determinants of Health   Financial Resource Strain: Not on file  Food Insecurity: Not on file  Transportation Needs: Not on file  Physical Activity: Not on file  Stress: Not on file  Social Connections: Not on file     Family History: The patient's family history includes Hypertension in his mother.  ROS:   Please see the history of present illness.    (+)  Palpitations All other systems reviewed and are negative.  EKGs/Labs/Other Studies Reviewed:    The following studies were reviewed today:  Monitor 08/2020: Sinus rhythm Rare premature ventricular contractions A single episode of nonsustained VT is noted, lasting 5 beats Rare atrial tachycardia, longest episode 8 minutes, 9 seconds   Patient had a min HR of 40 bpm, max HR of 154 bpm, and avg HR of 63 bpm. No afib  Nuclear stress test 06/27/2019: Nuclear stress EF: 62%. Blood pressure demonstrated a normal response to exercise. There was no ST segment deviation noted during stress. The study is normal. No ischemia. This is a low risk study. The left ventricular ejection fraction is normal (55-65%).  Atrial Fibrillation Ablation 02/15/2018: CONCLUSIONS: 1. Sinus rhythm  upon presentation.   2. Intracardiac echo reveals a moderate sized left atrium without evidence of pulmonary vein stenosis. 3.  The right inferior and left inferior pulmonary veins were quiescent from a prior ablation procedure.  There was modest return of conduction  within the right superior pulmonary vein and minimal return of conduction within the left superior PV. 4. As the prior ablation was relatively antral, I elected to perform a more wide atrial circumferential ablation around all 4 PVs today. 5. Additional left atrial ablation was performed with a standard box lesion created along the posterior wall of the left atrium 6. No early apparent complications.  Cardiac CTA 02/09/2018: There was mild bi atrial enlargement. 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 There was no LAA thrombus. PV anatomy was normal. Aortic root was normal at 3.7 cm   No pericardial effusion. Esophagus courses closest to the LLPV ostium   RUPV: Ostium 21.1 mm   area 3.53 cm2   RLPV:  Ostium 16.1 mm  area 2.21 cm2   LUPV:  Ostium 21.1 mm area 3.86 cm2   LLPV:  Ostium 14.7 mm  area 1.78 cm2 somewhat slit like orifice   Calcium score Calcium noted in mid and distal LAD as well as proximal RCA   IMPRESSION: 1.  No LAA thrombus   2.  Mild bi atrial enlargement   3.  Aneurysmal atrial septum   4.  Normal PV anatomy   5.  Normal aortic root 3.7 cm   6.  No pericardial effusion   7. Coronary calcium score 82 which is 47 th percentile for age and sex  Echo 04/02/2017: Study Conclusions  - Left ventricle: Systolic function was normal. No evidence of    thrombus.  - Left atrium: No evidence of thrombus in the atrial cavity or    appendage.   Impressions:  - Successful cardioversion. No cardiac source of emboli was    indentified.   EKG:   EKG is personally reviewed and interpreted. 03/12/2021: EKG was not ordered. 08/21/2020 [name], NP): Sinus rhythm with first degree AV block, pr int 224 ms, qrs int 102 ms, qtc 415 ms. Strips brought in from home show PVC's, sometimes  in a bigeminal/trigeminal pattern  Recent Labs: No results found for requested labs within last 365 days.   Recent Lipid Panel No results found for: "CHOL",  "TRIG", "HDL", "CHOLHDL", "VLDL", "LDLCALC", "LDLDIRECT"  Physical Exam:    VS:  BP 122/60   Pulse 68   Ht 6' (1.829 m)   Wt 225 lb (102.1 kg)   SpO2 98%   BMI 30.52 kg/m     Wt Readings from Last 3 Encounters:  04/22/22 225 lb (102.1 kg)  07/18/21 220  lb 6.4 oz (100 kg)  03/12/21 218 lb 3.2 oz (99 kg)    GEN: Well nourished, well developed, in no acute distress HEENT: normal Neck: no JVD, carotid bruits, or masses Cardiac: RRR; no murmurs, rubs, or gallops,no edema  Respiratory:  clear to auscultation bilaterally, normal work of breathing GI: soft, nontender, nondistended, + BS MS: no deformity or atrophy Skin: warm and dry, no rash Neuro:  Alert and Oriented x 3, Strength and sensation are intact Psych: euthymic mood, full affect   ASSESSMENT:    1. Paroxysmal atrial fibrillation (HCC)   2. Elevated coronary artery calcium score      PLAN:    In order of problems listed above:   Persistent atrial fibrillation Ablation x2 Dr. [name] 2019.  Overall doing well.  [name] has seen him previously in the atrial fibrillation clinic.  Had PVCs.  Explained the physiologic difference between PVCs and atrial fibrillation.  Low-dose Toprol 25 mg extended release was added.  Seems to be doing quite well.  Rarely will feel these.  Explained that these are benign.  Has flecainide for pill in the pocket approach for atrial fibrillation.  Agree that we will try to avoid long-term antiarrhythmic given his AV delay.  He has seen Dr. Lars Mage with electrophysiology to establish care.  HTN (hypertension) Overall well-controlled.  Weight loss has helped.  Obstructive sleep apnea Followed previously by Dr. Maxwell Caul.  Doing well.  Atypical chest pain Previously evaluated demonstrating coronary artery calcification, nonobstructive CAD.  Continue with risk factor prevention.  Could consider statin therapy in the future.  Follow-up: 1 year.  Medication Adjustments/Labs and  Tests Ordered: Current medicines are reviewed at length with the patient today.  Concerns regarding medicines are outlined above.   No orders of the defined types were placed in this encounter.   No orders of the defined types were placed in this encounter.   Patient Instructions  Medication Instructions:  No changes *If you need a refill on your cardiac medications before your next appointment, please call your pharmacy*   Lab Work: none If you have labs (blood work) drawn today and your tests are completely normal, you will receive your results only by: [place] (if you have MyChart) OR A paper copy in the mail If you have any lab test that is abnormal or we need to change your treatment, we will call you to review the results.   Testing/Procedures: none   Follow-Up: At [facility], you and your health needs are our priority.  As part of our continuing mission to provide you with exceptional heart care, we have created designated Provider Care Teams.  These Care Teams include your primary Cardiologist (physician) and Advanced Practice Providers (APPs -  Physician Assistants and Nurse Practitioners) who all work together to provide you with the care you need, when you need it.   Your next appointment:   12 month(s)  Provider:   [name], MD        [facility],acting as a scribe for [name], MD.,have documented all relevant documentation on the behalf of [name], MD,as directed by  [name], MD while in the presence of [name], MD.  I, [name], MD, have reviewed all documentation for this visit. The documentation on 04/22/22 for the exam, diagnosis, procedures, and orders are all accurate and complete.   Signed, [name], MD  04/22/2022 5:19 PM    [place]

## 2022-04-22 NOTE — Patient Instructions (Signed)
Medication Instructions:  No changes *If you need a refill on your cardiac medications before your next appointment, please call your pharmacy*   Lab Work: none If you have labs (blood work) drawn today and your tests are completely normal, you will receive your results only by: [place] (if you have MyChart) OR A paper copy in the mail If you have any lab test that is abnormal or we need to change your treatment, we will call you to review the results.   Testing/Procedures: none   Follow-Up: At [facility], you and your health needs are our priority.  As part of our continuing mission to provide you with exceptional heart care, we have created designated Provider Care Teams.  These Care Teams include your primary Cardiologist (physician) and Advanced Practice Providers (APPs -  Physician Assistants and Nurse Practitioners) who all work together to provide you with the care you need, when you need it.   Your next appointment:   12 month(s)  Provider:   [name], MD

## 2022-05-21 ENCOUNTER — Encounter (HOSPITAL_COMMUNITY): Payer: Self-pay | Admitting: *Deleted

## 2022-06-12 ENCOUNTER — Other Ambulatory Visit (HOSPITAL_COMMUNITY): Payer: Self-pay | Admitting: Cardiology

## 2022-06-24 ENCOUNTER — Ambulatory Visit (INDEPENDENT_AMBULATORY_CARE_PROVIDER_SITE_OTHER): Payer: 59 | Admitting: Orthopaedic Surgery

## 2022-06-24 ENCOUNTER — Ambulatory Visit (INDEPENDENT_AMBULATORY_CARE_PROVIDER_SITE_OTHER): Payer: 59

## 2022-06-24 VITALS — BP 144/71 | HR 64 | Ht 72.0 in | Wt 225.0 lb

## 2022-06-24 DIAGNOSIS — M659 Synovitis and tenosynovitis, unspecified: Secondary | ICD-10-CM | POA: Diagnosis not present

## 2022-06-24 DIAGNOSIS — G8929 Other chronic pain: Secondary | ICD-10-CM

## 2022-06-24 DIAGNOSIS — M25562 Pain in left knee: Secondary | ICD-10-CM

## 2022-06-24 MED ORDER — LIDOCAINE HCL 1 % IJ SOLN
0.5000 mL | INTRAMUSCULAR | Status: AC | PRN
  Administered 2022-06-24: .5 mL

## 2022-06-24 MED ORDER — BUPIVACAINE HCL 0.5 % IJ SOLN
3.0000 mL | INTRAMUSCULAR | Status: AC | PRN
  Administered 2022-06-24: 3 mL via INTRA_ARTICULAR

## 2022-06-24 MED ORDER — METHYLPREDNISOLONE ACETATE 40 MG/ML IJ SUSP
40.0000 mg | INTRAMUSCULAR | Status: AC | PRN
  Administered 2022-06-24: 40 mg via INTRA_ARTICULAR

## 2022-06-24 NOTE — Progress Notes (Signed)
Office Visit Note   Patient: Reginald [name]           Date of Birth: 1950/11/17           MRN: [id number] Visit Date: 06/24/2022              Requested by: [name], MD [address] [place],   [address] PCP: [name], MD   Assessment & Plan: Visit Diagnoses:  1. Chronic pain of left knee   2. Synovitis of left knee     Plan: Left knee injection performed which she tolerated well.  Let us know if he has continued problems.  Follow-Up Instructions: No follow-ups on file.   Orders:  Orders Placed This Encounter  Procedures   XR Knee 1-2 Views Left   No orders of the defined types were placed in this encounter.     Procedures: Large Joint Inj: L knee on 06/24/2022 4:18 PM Indications: joint swelling and pain Details: 22 G 1.5 in needle, anteromedial approach  Arthrogram: No  Medications: 0.5 mL lidocaine 1 %; 3 mL bupivacaine 0.5 %; 40 mg methylPREDNISolone acetate 40 MG/ML Outcome: tolerated well, no immediate complications Procedure, treatment alternatives, risks and benefits explained, specific risks discussed. Consent was given by the patient. Immediately prior to procedure a time out was called to verify the correct patient, procedure, equipment, support staff and site/side marked as required. Patient was prepped and draped in the usual sterile fashion.       Clinical Data: No additional findings.   Subjective: Chief Complaint  Patient presents with   Left Knee - Pain    HPI 72 year old male returns is requesting a cortisone injection since he had synovitis swelling his knee aching pain bothers him when he walks.  Previous injection done by Dr.Wainer 6 months ago in September.  He has had increased swelling.  Patient on Toprol Cardizem for blood pressure and also takes Eliquis for atrial fibrillation.  Patient's had significant weight loss with dieting and exercise over the last year and a half.  Review of Systems all  systems noncontributory to HPI.   Objective: Vital Signs: BP (!) 144/71   Pulse 64   Ht 6' (1.829 m)   Wt 225 lb (102.1 kg)   BMI 30.52 kg/m   Physical Exam  Ortho Exam  Specialty Comments:  No specialty comments available.  Imaging: No results found.   PMFS History: Patient Active Problem List   Diagnosis Date Noted   Synovitis of left knee 06/24/2022   Elevated coronary artery calcium score 11/26/2021   Obstructive sleep apnea 03/12/2021   Atypical chest pain 03/12/2021   Persistent atrial fibrillation ([place]) 02/15/2018   Shortness of breath    Obesity 01/17/2013   HTN (hypertension) 01/17/2013   Snoring 01/17/2013   Atrial fibrillation ([place]) 07/14/2012   Past Medical History:  Diagnosis Date   Allergy to alpha-gal    Hypertension    Impaired fasting glucose    Persistent atrial fibrillation ([place]) 07/10/2012   cardioversion x 2   Pneumonia    8th grade, without recurrence   Ureteral stone     Family History  Problem Relation Age of Onset   Hypertension Mother     Past Surgical History:  Procedure Laterality Date   ATRIAL FIBRILLATION ABLATION  04/19/2014   ATRIAL FIBRILLATION ABLATION N/A 04/19/2014   Procedure: ATRIAL FIBRILLATION ABLATION;  Surgeon: [name], MD;  Location: [facility] CATH LAB;  Service: Cardiovascular;  Laterality: N/A;  ATRIAL FIBRILLATION ABLATION N/A 02/15/2018   Procedure: ATRIAL FIBRILLATION ABLATION;  Surgeon: [name], MD;  Location: [place] CV LAB;  Service: Cardiovascular;  Laterality: N/A;   CARDIOVERSION N/A 07/14/2012   Procedure: CARDIOVERSION;  Surgeon: [name], MD;  Location: [place];  Service: Cardiovascular;  Laterality: N/A;   CARDIOVERSION N/A 09/16/2012   Procedure: CARDIOVERSION;  Surgeon: [name], MD;  Location: [place];  Service: Cardiovascular;  Laterality: N/A;   CARDIOVERSION N/A 03/14/2014   Procedure: CARDIOVERSION;  Surgeon: [name], MD;  Location: [facility] ENDOSCOPY;  Service: Cardiovascular;   Laterality: N/A;   CARDIOVERSION N/A 04/02/2017   Procedure: CARDIOVERSION;  Surgeon: [name], MD;  Location: [place];  Service: Cardiovascular;  Laterality: N/A;   CARDIOVERSION N/A 12/21/2017   Procedure: CARDIOVERSION;  Surgeon: [name], MD;  Location: [facility] ENDOSCOPY;  Service: Cardiovascular;  Laterality: N/A;   CARDIOVERSION N/A 10/05/2018   Procedure: CARDIOVERSION;  Surgeon: [name], MD;  Location: [place];  Service: Cardiovascular;  Laterality: N/A;   COLONOSCOPY WITH PROPOFOL N/A 01/31/2013   Procedure: COLONOSCOPY WITH PROPOFOL;  Surgeon: [name], MD;  Location: [facility] ENDOSCOPY;  Service: Endoscopy;  Laterality: N/A;   FRACTURE SURGERY     ORIF METACARPAL FRACTURE Right    SHOULDER ARTHROSCOPY W/ ROTATOR CUFF REPAIR Bilateral    TEE WITHOUT CARDIOVERSION N/A 07/14/2012   Procedure: TRANSESOPHAGEAL ECHOCARDIOGRAM (TEE);  Surgeon: [name], MD;  Location: [facility] ENDOSCOPY;  Service: Cardiovascular;  Laterality: N/A;   TEE WITHOUT CARDIOVERSION N/A 09/16/2012   Procedure: TRANSESOPHAGEAL ECHOCARDIOGRAM (TEE);  Surgeon: [name], MD;  Location: [facility] ENDOSCOPY;  Service: Cardiovascular;  Laterality: N/A;   TEE WITHOUT CARDIOVERSION N/A 04/18/2014   Procedure: TRANSESOPHAGEAL ECHOCARDIOGRAM (TEE);  Surgeon: [name], MD;  Location: [facility] ENDOSCOPY;  Service: Cardiovascular;  Laterality: N/A;   TEE WITHOUT CARDIOVERSION N/A 04/02/2017   Procedure: TRANSESOPHAGEAL ECHOCARDIOGRAM (TEE);  Surgeon: [name], MD;  Location: [facility] ENDOSCOPY;  Service: Cardiovascular;  Laterality: N/A;   Social History   Occupational History   Not on file  Tobacco Use   Smoking status: Never   Smokeless tobacco: Never  Vaping Use   Vaping Use: Never used  Substance and Sexual Activity   Alcohol use: Yes    Alcohol/week: 14.0 standard drinks of alcohol    Types: 7 Glasses of wine, 7 Shots of liquor per week    Comment: 04/19/2014 "2-3 glasses of wine or liquor per  day"   Drug use: No   Sexual activity: Not Currently

## 2022-07-08 ENCOUNTER — Telehealth: Payer: Self-pay | Admitting: Cardiology

## 2022-07-08 NOTE — Telephone Encounter (Signed)
Reviewed this information with pt who states understanding.  Pt is also taking Vit C, D3 and zinc.  S/s of scratchy throat, nasal stuffiness, headache.  No fever or cough at this time.  He will call back if any further questions/concerns.

## 2022-07-08 NOTE — Telephone Encounter (Signed)
Patient states he just tested positive for COVID and  he would like to know what medication he needs to take. Please advise.

## 2022-07-08 NOTE — Telephone Encounter (Signed)
Would just recommend supportive care  You want to avoid any product that says its "D" or "PE",  but DM (dextromethorphan) is ok to take for cough. The "D" means it has pseudoephedrine (Sudafed) in it and "PE" means it has phenylephrine in it. These both can increase your blood pressure and heart rate. You also want to avoid NSAID's like ibuprofen and naproxen. Tylenol is ok to take for pain/fever. You can try saline nasal rinses (such as a netti pot) for congestion along with Flonase or Atrovent. Cough drops are ok too for sore throat. Antihistamines like chlorpheniramine (can cause drowsiness) is good for runny nose/sneezing. Antihistamines like allegra and zyrtec are ok to take as well. These do not cause drowsiness typically. For a productive cough, can take generic or brand Mucinex. For a dry cough generic or brand Delsym (dextromethorphan).

## 2022-08-01 ENCOUNTER — Other Ambulatory Visit: Payer: Self-pay | Admitting: Cardiology

## 2022-08-04 DIAGNOSIS — H25811 Combined forms of age-related cataract, right eye: Secondary | ICD-10-CM | POA: Diagnosis not present

## 2022-08-04 DIAGNOSIS — H269 Unspecified cataract: Secondary | ICD-10-CM | POA: Diagnosis not present

## 2022-08-04 DIAGNOSIS — H52202 Unspecified astigmatism, left eye: Secondary | ICD-10-CM | POA: Diagnosis not present

## 2022-08-18 DIAGNOSIS — H2511 Age-related nuclear cataract, right eye: Secondary | ICD-10-CM | POA: Diagnosis not present

## 2022-08-18 DIAGNOSIS — H269 Unspecified cataract: Secondary | ICD-10-CM | POA: Diagnosis not present

## 2022-08-18 DIAGNOSIS — H52201 Unspecified astigmatism, right eye: Secondary | ICD-10-CM | POA: Diagnosis not present

## 2022-09-09 ENCOUNTER — Telehealth: Payer: Self-pay | Admitting: Cardiology

## 2022-09-09 DIAGNOSIS — I480 Paroxysmal atrial fibrillation: Secondary | ICD-10-CM

## 2022-09-09 MED ORDER — APIXABAN 5 MG PO TABS: 5.0000 mg | ORAL_TABLET | Freq: Two times a day (BID) | ORAL | 1 refills | Status: DC

## 2022-09-09 NOTE — Telephone Encounter (Signed)
Pt last saw Dr Anne Fu 04/22/22, last labs 10/07/21 Creat 0.68 at [facility] per [facility], age 72, weight 102.1kg, based on specified criteria pt is on appropriate dosage of Eliquis 5mg  BID for afib.  Will refill rx.

## 2022-09-09 NOTE — Telephone Encounter (Signed)
*  STAT* If patient is at the pharmacy, call can be transferred to refill team.   1. Which medications need to be refilled? (please list name of each medication and dose if known)   apixaban (ELIQUIS) 5 MG TABS tablet    2. Which pharmacy/location (including street and city if local pharmacy) is medication to be sent to? [id number] - [place], [place] - [address]    3. Do they need a 30 day or 90 day supply? 90 day   Pt has only 2 tablets left

## 2022-10-08 ENCOUNTER — Ambulatory Visit: Payer: 59 | Attending: Student | Admitting: Pulmonary Disease

## 2022-10-08 ENCOUNTER — Encounter: Payer: Self-pay | Admitting: Student

## 2022-10-08 VITALS — BP 138/72 | HR 62 | Ht 72.0 in | Wt 223.8 lb

## 2022-10-08 DIAGNOSIS — G4733 Obstructive sleep apnea (adult) (pediatric): Secondary | ICD-10-CM | POA: Diagnosis not present

## 2022-10-08 DIAGNOSIS — I480 Paroxysmal atrial fibrillation: Secondary | ICD-10-CM | POA: Diagnosis not present

## 2022-10-08 DIAGNOSIS — I493 Ventricular premature depolarization: Secondary | ICD-10-CM

## 2022-10-08 NOTE — Progress Notes (Signed)
Electrophysiology Office Note:   Date:  10/08/2022  ID:  Reginald [name], DOB 1950/10/24, MRN [id number]  Primary Cardiologist: Donato Schultz, MD Electrophysiologist: Lanier Prude, MD      History of Present Illness:   Reginald [name] is a 72 y.o. male with h/o alpha-gal allergy, HTN persistent AF s/p ablation x2 seen today for routine electrophysiology followup.   Since last being seen in our clinic the patient reports doing very well.  He remains very active - continues to walk approximately 2 miles per day with his dog, golf's & walks the course, fly's a plane frequently. States he has not had any episodes of AF (was previously very aware of symptoms).  He reports he had an episode of AF one month after his ablation in 2019 and really none since. No PVC's in almost a year. He has not used his PRN medications.  Asks about the need for anticoagulation.  He denies chest pain, palpitations, dyspnea, PND, orthopnea, nausea, vomiting, dizziness, syncope, edema, weight gain, or early satiety.   Review of systems complete and found to be negative unless listed in HPI.    EP Information / Studies Reviewed:     Studies 2016 AF Ablation  2018 TEE > normal LV systolic function, no thrombus, s/p successful cardioversion 2019 EP Study > SR on presentation, R/L inferior pulmonary veins quiescent from prior ablation, wide atrial circumferential ablation around all 4  PV's performed.    AAD / Anticoagulation  Flecainide > stopped post ablation, PRN only for breakthrough Eliquis    EKG is ordered today. Personal review as below.  EKG Interpretation  Date/Time:  Thursday October 08 2022 08:38:22 EDT Ventricular Rate:  56 PR Interval:  200 QRS Duration: 96 QT Interval:  398 QTC Calculation: 384 R Axis:   25 Text Interpretation: Sinus bradycardia When compared with ECG of 21-Aug-2020 14:01, ST no longer elevated in Anterior leads Confirmed by Canary Brim ([id number]) on 10/08/2022 9:05:30 AM         Risk Assessment/Calculations:    CHA2DS2-VASc Score = 3   This indicates a 3.2% annual risk of stroke. The patient's score is based upon: CHF History: 0 HTN History: 1 Diabetes History: 0 Stroke History: 0 Vascular Disease History: 1 Age Score: 1 Gender Score: 0             Physical Exam:   VS:  BP 138/72   Pulse 62   Ht 6' (1.829 m)   Wt 223 lb 12.8 oz (101.5 kg)   SpO2 97%   BMI 30.35 kg/m    Wt Readings from Last 3 Encounters:  10/08/22 223 lb 12.8 oz (101.5 kg)  06/24/22 225 lb (102.1 kg)  04/22/22 225 lb (102.1 kg)     GEN: Well nourished, well developed in no acute distress NECK: No JVD; No carotid bruits CARDIAC: Regular rate and rhythm, no murmurs, rubs, gallops RESPIRATORY:  Clear to auscultation without rales, wheezing or rhonchi  ABDOMEN: Soft, non-tender, non-distended EXTREMITIES:  No edema; No deformity   ASSESSMENT AND PLAN:    Persistent Atrial Fibrillation  S/p ablation 2016, 2018.  -continue eliquis for stroke prevention, discussed risk of stroke per year with patient, need for anticoagulation. Pt indicates understanding.  -PRN flecainide for breakthrough AF episodes   PVC -diltiazem scheduled & PRN dosing   HTN  -well controlled  OSA  -compliance encouraged     Follow up with Dr. Lalla Brothers in 12 months  Signed, Canary Brim, MSN, APRN, NP-C,  AGACNP-BC [facility] - Electrophysiology  10/08/2022, 9:11 AM

## 2022-10-08 NOTE — Patient Instructions (Signed)
Medication Instructions:  Your physician recommends that you continue on your current medications as directed. Please refer to the Current Medication list given to you today.  *If you need a refill on your cardiac medications before your next appointment, please call your pharmacy*   Lab Work: None ordered If you have labs (blood work) drawn today and your tests are completely normal, you will receive your results only by: MyChart Message (if you have MyChart) OR A paper copy in the mail If you have any lab test that is abnormal or we need to change your treatment, we will call you to review the results   Follow-Up: At [facility], you and your health needs are our priority.  As part of our continuing mission to provide you with exceptional heart care, we have created designated Provider Care Teams.  These Care Teams include your primary Cardiologist (physician) and Advanced Practice Providers (APPs -  Physician Assistants and Nurse Practitioners) who all work together to provide you with the care you need, when you need it.  Your next appointment:   1 year(s)  Provider:   Cameron Lambert, MD  

## 2022-10-29 ENCOUNTER — Other Ambulatory Visit (HOSPITAL_COMMUNITY): Payer: Self-pay | Admitting: Cardiology

## 2023-03-01 ENCOUNTER — Other Ambulatory Visit: Payer: Self-pay | Admitting: Cardiology

## 2023-03-01 DIAGNOSIS — I480 Paroxysmal atrial fibrillation: Secondary | ICD-10-CM

## 2023-03-01 NOTE — Telephone Encounter (Signed)
Prescription refill request for Eliquis received. Indication:afib Last office visit:6/24 BMW:UXLKG labs Age: 72 Weight:101.5  kg  Prescription refilled

## 2023-03-15 ENCOUNTER — Encounter: Payer: Self-pay | Admitting: Cardiology

## 2023-03-15 DIAGNOSIS — I10 Essential (primary) hypertension: Secondary | ICD-10-CM

## 2023-03-15 DIAGNOSIS — I480 Paroxysmal atrial fibrillation: Secondary | ICD-10-CM

## 2023-03-15 DIAGNOSIS — R931 Abnormal findings on diagnostic imaging of heart and coronary circulation: Secondary | ICD-10-CM

## 2023-03-15 DIAGNOSIS — Z79899 Other long term (current) drug therapy: Secondary | ICD-10-CM

## 2023-04-03 ENCOUNTER — Other Ambulatory Visit: Payer: Self-pay | Admitting: Cardiology

## 2023-04-03 DIAGNOSIS — I480 Paroxysmal atrial fibrillation: Secondary | ICD-10-CM

## 2023-04-05 NOTE — Telephone Encounter (Signed)
 Prescription refill request for Eliquis received. Indication:afib Last office visit:6/24 BMW:UXLKG labs Age: 72 Weight:101.5  kg  Prescription refilled

## 2023-04-09 ENCOUNTER — Other Ambulatory Visit: Payer: Self-pay

## 2023-04-09 DIAGNOSIS — I480 Paroxysmal atrial fibrillation: Secondary | ICD-10-CM

## 2023-04-09 LAB — CBC WITH DIFFERENTIAL/PLATELET
Basophils Absolute: 0.1 10*3/uL (ref 0.0–0.2)
Basos: 1 %
EOS (ABSOLUTE): 0.4 10*3/uL (ref 0.0–0.4)
Eos: 6 %
Hematocrit: 47.8 % (ref 37.5–51.0)
Hemoglobin: 15.9 g/dL (ref 13.0–17.7)
Immature Grans (Abs): 0 10*3/uL (ref 0.0–0.1)
Immature Granulocytes: 0 %
Lymphocytes Absolute: 1.6 10*3/uL (ref 0.7–3.1)
Lymphs: 24 %
MCH: 29.7 pg (ref 26.6–33.0)
MCHC: 33.3 g/dL (ref 31.5–35.7)
MCV: 89 fL (ref 79–97)
Monocytes Absolute: 0.5 10*3/uL (ref 0.1–0.9)
Monocytes: 8 %
Neutrophils Absolute: 4.1 10*3/uL (ref 1.4–7.0)
Neutrophils: 61 %
Platelets: 204 10*3/uL (ref 150–450)
RBC: 5.36 x10E6/uL (ref 4.14–5.80)
RDW: 13 % (ref 11.6–15.4)
WBC: 6.8 10*3/uL (ref 3.4–10.8)

## 2023-04-09 LAB — BASIC METABOLIC PANEL
BUN/Creatinine Ratio: 18 (ref 10–24)
BUN: 13 mg/dL (ref 8–27)
CO2: 23 mmol/L (ref 20–29)
Calcium: 9.6 mg/dL (ref 8.6–10.2)
Chloride: 103 mmol/L (ref 96–106)
Creatinine, Ser: 0.71 mg/dL — ABNORMAL LOW (ref 0.76–1.27)
Glucose: 96 mg/dL (ref 70–99)
Potassium: 4.5 mmol/L (ref 3.5–5.2)
Sodium: 141 mmol/L (ref 134–144)
eGFR: 97 mL/min/{1.73_m2} (ref >59)

## 2023-04-09 MED ORDER — APIXABAN 5 MG PO TABS: 5.0000 mg | ORAL_TABLET | Freq: Two times a day (BID) | ORAL | 5 refills | Status: DC

## 2023-04-19 DIAGNOSIS — J329 Chronic sinusitis, unspecified: Secondary | ICD-10-CM | POA: Diagnosis not present

## 2023-04-20 ENCOUNTER — Other Ambulatory Visit: Payer: Self-pay | Admitting: Cardiology

## 2023-04-20 ENCOUNTER — Other Ambulatory Visit (HOSPITAL_COMMUNITY): Payer: 59

## 2023-04-28 ENCOUNTER — Ambulatory Visit (HOSPITAL_COMMUNITY)
Admission: RE | Admit: 2023-04-28 | Discharge: 2023-04-28 | Disposition: A | Discharge status: Home / Self Care | Payer: Self-pay | Source: Ambulatory Visit | Attending: Cardiology | Admitting: Cardiology

## 2023-04-28 DIAGNOSIS — R931 Abnormal findings on diagnostic imaging of heart and coronary circulation: Secondary | ICD-10-CM | POA: Insufficient documentation

## 2023-04-29 ENCOUNTER — Encounter: Payer: Self-pay | Admitting: Cardiology

## 2023-05-28 ENCOUNTER — Other Ambulatory Visit (HOSPITAL_COMMUNITY): Payer: Self-pay | Admitting: Cardiology

## 2023-06-01 ENCOUNTER — Telehealth: Payer: Self-pay | Admitting: Cardiology

## 2023-06-01 NOTE — Telephone Encounter (Signed)
Called pt to resch appt from Thurs.  He does not want to see a PA, He wants to see skains only and does not want to wait til may.  He stated he waited a while for this appt.  He would like to know if skains could work him in?     [phone]

## 2023-06-03 ENCOUNTER — Ambulatory Visit: Payer: 59 | Admitting: Cardiology

## 2023-06-11 NOTE — Telephone Encounter (Signed)
Patient is following up due to not hearing back from anyone. Please advise.

## 2023-06-11 NOTE — Telephone Encounter (Signed)
 Spoke with patient about scheduling an appointment. Pt was scheduled to see Dr. Anne Fu on March 31st at 8 AM at our [facility] location. Pt verbalized understanding and all, if any, questions were answered.

## 2023-07-12 ENCOUNTER — Encounter (HOSPITAL_BASED_OUTPATIENT_CLINIC_OR_DEPARTMENT_OTHER): Payer: Self-pay | Admitting: Cardiology

## 2023-07-12 ENCOUNTER — Other Ambulatory Visit (HOSPITAL_BASED_OUTPATIENT_CLINIC_OR_DEPARTMENT_OTHER): Payer: Self-pay | Admitting: *Deleted

## 2023-07-12 ENCOUNTER — Ambulatory Visit (INDEPENDENT_AMBULATORY_CARE_PROVIDER_SITE_OTHER): Payer: 59 | Admitting: Cardiology

## 2023-07-12 VITALS — BP 126/64 | HR 68 | Ht 72.0 in | Wt 227.8 lb

## 2023-07-12 DIAGNOSIS — R931 Abnormal findings on diagnostic imaging of heart and coronary circulation: Secondary | ICD-10-CM

## 2023-07-12 DIAGNOSIS — I10 Essential (primary) hypertension: Secondary | ICD-10-CM

## 2023-07-12 DIAGNOSIS — Z79899 Other long term (current) drug therapy: Secondary | ICD-10-CM

## 2023-07-12 DIAGNOSIS — I480 Paroxysmal atrial fibrillation: Secondary | ICD-10-CM | POA: Diagnosis not present

## 2023-07-12 DIAGNOSIS — I493 Ventricular premature depolarization: Secondary | ICD-10-CM

## 2023-07-12 MED ORDER — ROSUVASTATIN CALCIUM 5 MG PO TABS: 5.0000 mg | ORAL_TABLET | Freq: Every day | ORAL | 3 refills | Status: DC

## 2023-07-12 NOTE — Progress Notes (Signed)
 Cardiology Office Note:  .   Date:  07/12/2023  ID:  Peterson Lombard, DOB [birth year], MRN [id number] PCP: Emilio Aspen, MD  [facility] Providers Cardiologist:  Donato Schultz, MD Electrophysiologist:  Lanier Prude, MD     History of Present Illness: .   Reginald [name] is a 73 y.o. male Discussed the use of AI scribe software for clinical note transcription with the patient, who gave verbal consent to proceed.  History of Present Illness Reginald [name] is a 73 year old male with elevated coronary calcium score, hypertension, and persistent atrial fibrillation status post ablation who presents for follow-up.  He has a history of persistent atrial fibrillation and has undergone two ablations, the first in 2016 and the second in 2019. After the second ablation, he experienced one brief episode of atrial fibrillation but has not had any recent episodes. He was previously on flecainide but stopped post-ablation and now uses it as a 'pill in the pocket' approach for breakthrough atrial fibrillation. He is currently on Eliquis 5 mg twice a day for chronic anticoagulation and has a CHADS-VASc score of 3. No recent episodes of atrial fibrillation, no PVCs in almost a year, and no need for flecainide recently.  He underwent a coronary calcium score on April 28, 2023, which showed a score of 176, placing him in the 49th percentile, with most calcification in the right coronary artery. He has a mildly dilated ascending aorta measuring 40 mm. His last LDL was 79 in July 2024. He is not on aspirin due to being on Eliquis.  Hypertension is under good control with his current medication regimen. He is taking amlodipine 5 mg once a day, diltiazem CD 180 mg daily, and metoprolol succinate 25 mg. He also has diltiazem 120 mg to take as needed.  He has a history of obstructive sleep apnea and is encouraged to maintain CPAP compliance.  He enjoys playing golf, walking his dog, and  flying his airplane. He reports regular physical activity, including walking the golf course and using an elliptical machine two out of every three days. No chest pain, no new shortness of breath, and no recent need for flecainide. He denies any unusual symptoms or changes in his health.    Studies Reviewed: Marland Kitchen   EKG Interpretation Date/Time:  Monday July 12 2023 08:11:44 EDT Ventricular Rate:  64 PR Interval:  194 QRS Duration:  92 QT Interval:  380 QTC Calculation: 392 R Axis:   47  Text Interpretation: Normal sinus rhythm Normal ECG When compared with ECG of 08-Oct-2022 08:38, No significant change was found Confirmed by Donato Schultz ([id number]) on 07/12/2023 8:20:16 AM    Results LABS LDL: 79 (10/2022) TSH: 0.8 Creatinine: 0.7 Hemoglobin: 15.2  RADIOLOGY Coronary calcium score: 176, 49th percentile, mostly in the right coronary artery (04/28/2023) CT scan: Mildly dilated ascending aorta of 40 mm  DIAGNOSTIC EKG: Sinus bradycardia, rate 56 (10/08/2022) Risk Assessment/Calculations:            Physical Exam:   VS:  BP 126/64   Pulse 68   Ht 6' (1.829 m)   Wt 227 lb 13.6 oz (103.4 kg)   SpO2 99%   BMI 30.90 kg/m    Wt Readings from Last 3 Encounters:  07/12/23 227 lb 13.6 oz (103.4 kg)  10/08/22 223 lb 12.8 oz (101.5 kg)  06/24/22 225 lb (102.1 kg)    GEN: Well nourished, well developed in no acute distress NECK: No JVD; No  carotid bruits CARDIAC: RRR, no murmurs, no rubs, no gallops RESPIRATORY:  Clear to auscultation without rales, wheezing or rhonchi  ABDOMEN: Soft, non-tender, non-distended EXTREMITIES:  No edema; No deformity   ASSESSMENT AND PLAN: .    Assessment and Plan Assessment & Plan Elevated coronary artery calcium score The coronary calcium score is 176, in the 49th percentile, primarily in the right coronary artery, indicating calcified plaque and a risk for coronary artery disease. The score suggests low to moderate plaque development. The goal is  to stabilize the plaque to prevent myocardial infarction. Crestor is recommended to reduce cholesterol levels and stabilize plaque. Most patients do not experience side effects, but a small percentage may have myalgia. If side effects occur, consider temporary discontinuation and dose adjustment. Crestor has a strong safety profile and significantly reduces myocardial infarction risk. - Initiate Crestor 5 mg daily to stabilize plaque and reduce cholesterol levels. - Recheck lipid panel in 2 months to assess the effect of Crestor. - Continue current medications including Eliquis for anticoagulation.  Persistent atrial fibrillation He has persistent atrial fibrillation, status post two ablations and a successful cardioversion, with no recent episodes or need for flecainide for breakthrough episodes. The CHADS-VASc score is 3, indicating moderate stroke risk. - Continue Eliquis 5 mg twice daily for anticoagulation. - Monitor for any symptoms of atrial fibrillation and use flecainide as needed for breakthrough episodes.  Hypertension Blood pressure is well-controlled with the current medication regimen. - Continue current antihypertensive medications: amlodipine 5 mg daily, diltiazem CD 180 mg daily, and metoprolol succinate 25 mg daily.  Mildly dilated ascending aorta The ascending aorta is mildly dilated at 40 mm, not currently concerning but requires periodic monitoring. There is a family history of cerebral aneurysm. - Consider periodic monitoring with CT scan or echocardiogram in the future.  Obstructive sleep apnea He has obstructive sleep apnea. CPAP compliance is encouraged to manage this condition. - Encourage continued use of CPAP for obstructive sleep apnea.          Signed, Donato Schultz, MD

## 2023-07-12 NOTE — Patient Instructions (Addendum)
 Medication Instructions:  Please start Crestor 5 mg once a day. Continue all other medications as listed.  *If you need a refill on your cardiac medications before your next appointment, please call your pharmacy*  Lab Work: Please have blood work in 2 months at your closest [facility] (Lipid)  If you have labs (blood work) drawn today and your tests are completely normal, you will receive your results only by: MyChart Message (if you have MyChart) OR A paper copy in the mail If you have any lab test that is abnormal or we need to change your treatment, we will call you to review the results.  Follow-Up: At [facility], you and your health needs are our priority.  As part of our continuing mission to provide you with exceptional heart care, our providers are all part of one team.  This team includes your primary Cardiologist (physician) and Advanced Practice Providers or APPs (Physician Assistants and Nurse Practitioners) who all work together to provide you with the care you need, when you need it.  Your next appointment:   1 year(s)  Provider:   Donato Schultz, MD     We recommend signing up for the patient portal called "MyChart".  Sign up information is provided on this After Visit Summary.  MyChart is used to connect with patients for Virtual Visits (Telemedicine).  Patients are able to view lab/test results, encounter notes, upcoming appointments, etc.  Non-urgent messages can be sent to your provider as well.   To learn more about what you can do with MyChart, go to [email or web address].

## 2023-07-19 ENCOUNTER — Other Ambulatory Visit: Payer: Self-pay | Admitting: Cardiology

## 2023-08-31 ENCOUNTER — Encounter (HOSPITAL_BASED_OUTPATIENT_CLINIC_OR_DEPARTMENT_OTHER): Payer: Self-pay | Admitting: Cardiology

## 2023-09-13 DIAGNOSIS — Z79899 Other long term (current) drug therapy: Secondary | ICD-10-CM | POA: Diagnosis not present

## 2023-09-13 DIAGNOSIS — R931 Abnormal findings on diagnostic imaging of heart and coronary circulation: Secondary | ICD-10-CM | POA: Diagnosis not present

## 2023-09-14 ENCOUNTER — Ambulatory Visit: Payer: Self-pay | Admitting: Cardiology

## 2023-09-14 DIAGNOSIS — Z79899 Other long term (current) drug therapy: Secondary | ICD-10-CM

## 2023-09-14 DIAGNOSIS — E785 Hyperlipidemia, unspecified: Secondary | ICD-10-CM

## 2023-09-14 LAB — LIPID PANEL
Chol/HDL Ratio: 3.5 ratio (ref 0.0–5.0)
Cholesterol, Total: 156 mg/dL (ref 100–199)
HDL: 44 mg/dL (ref >39)
LDL Chol Calc (NIH): 80 mg/dL (ref 0–99)
Triglycerides: 187 mg/dL — ABNORMAL HIGH (ref 0–149)
VLDL Cholesterol Cal: 32 mg/dL (ref 5–40)

## 2023-09-29 DIAGNOSIS — H31002 Unspecified chorioretinal scars, left eye: Secondary | ICD-10-CM | POA: Diagnosis not present

## 2023-09-29 DIAGNOSIS — Z961 Presence of intraocular lens: Secondary | ICD-10-CM | POA: Diagnosis not present

## 2023-10-09 ENCOUNTER — Other Ambulatory Visit (HOSPITAL_COMMUNITY): Payer: Self-pay | Admitting: Cardiology

## 2023-10-19 ENCOUNTER — Telehealth: Payer: Self-pay | Admitting: Cardiology

## 2023-10-19 ENCOUNTER — Other Ambulatory Visit: Payer: Self-pay | Admitting: Cardiology

## 2023-10-19 DIAGNOSIS — I480 Paroxysmal atrial fibrillation: Secondary | ICD-10-CM

## 2023-10-19 MED ORDER — APIXABAN 5 MG PO TABS: 5.0000 mg | ORAL_TABLET | Freq: Two times a day (BID) | ORAL | 1 refills | Status: DC

## 2023-10-19 NOTE — Telephone Encounter (Signed)
 Prescription refill request for Eliquis  received. Indication: PAF Last office visit: 07/12/23  CHRISTELLA Parchment MD Scr: 0.71 on 04/08/23  Epic Age: 73 Weight: 103.4kg  Based on above findings Eliquis  5mg  twice daily is the appropriate dose.  Refill approved.

## 2023-10-19 NOTE — Telephone Encounter (Signed)
 Refill Request.

## 2023-10-19 NOTE — Telephone Encounter (Signed)
*  STAT* If patient is at the pharmacy, call can be transferred to refill team.   1. Which medications need to be refilled? (please list name of each medication and dose if known) apixaban  (ELIQUIS ) 5 MG TABS tablet   2. Which pharmacy/location (including street and city if local pharmacy) is medication to be sent to?  [id number] - [place], [place] - [address]      3. Do they need a 30 day or 90 day supply? 90 day   Pt is out of medication and leaving for his trip tomorrow.

## 2023-11-04 ENCOUNTER — Encounter: Payer: Self-pay | Admitting: Pharmacist

## 2023-11-04 ENCOUNTER — Ambulatory Visit: Payer: Self-pay | Attending: Cardiology | Admitting: Pharmacist

## 2023-11-04 DIAGNOSIS — E785 Hyperlipidemia, unspecified: Secondary | ICD-10-CM | POA: Insufficient documentation

## 2023-11-04 DIAGNOSIS — R931 Abnormal findings on diagnostic imaging of heart and coronary circulation: Secondary | ICD-10-CM | POA: Diagnosis not present

## 2023-11-04 MED ORDER — ATORVASTATIN CALCIUM 20 MG PO TABS: 20.0000 mg | ORAL_TABLET | Freq: Every day | ORAL | 3 refills | Status: AC

## 2023-11-04 NOTE — Assessment & Plan Note (Signed)
 Assessment:  LDL goal: < 70 mg/dl last LDLc 80 mg/dl TG 812 mg/dl (93/7974) while on Crestor  5 mg daily  Intolerance to moderate intensity statins - Crestor  - myalgia  Discussed next potential options (other statins, Zetia, PCSK-9 inhibitors, bempedoic acid and inclisiran); cost, dosing efficacy, side effects  Reiterated importance of heart healthy diet   Plan: Start taking atorvastatin  20 mg daily if not tolerated start taking 10 mg daily or even 10 mg every other day Pt to inform us  max tolerated dose of atorvastatin  in 4-6 weeks either via MyChart or Phone   Will get baseline Lipid level before starting the treatment and once on stable treatment will repeat LFT and FLP in 2-3 months

## 2023-11-04 NOTE — Progress Notes (Signed)
 Patient ID: Reginald [name]                 DOB: 24-Nov-1950                    MRN: [id number]      HPI: Reginald [name] is a 73 y.o. male patient referred to lipid clinic by Reginald [name]. PMH is significant for hypertension, atrial fibrilation, CAD, OSA, obesity, atypical chest pain    The patient states that he saw Reginald [name] in March 2025, at which time a high coronary artery calcium  (CAC) score was noted, and he was started on Crestor  (rosuvastatin ) 5 mg daily. By mid-May, he began experiencing joint and muscle pain, which he found difficult to tolerate. As a result, he was advised to hold the statin, and a referral was made to the lipid clinic for further evaluation and management.  The patient presented today for lipid clinic evaluation. He reports that he has never tried any other statins besides Crestor , which he is currently taking at 5 mg daily. His most recent LDL level on this dose was 80 mg/dL, and the highest LDL recorded in 2024 was 100 mg/dL. Given his cardiovascular risk factors, his target LDL goal is <70 mg/dL. We discussed obtaining an updated baseline lipid panel off medication, which the patient is willing to do; he plans to stop his statin and get labs on Monday, July 28. During the visit, we reviewed multiple options for LDL-lowering therapy, including other statins, ezetimibe, PCSK9 inhibitors, bempedoic acid, and inclisiran. The discussion covered mechanisms of action, dosing, side effects, expected LDL reductions, and cost considerations, including potential eligibility for patient assistance programs. The patient would like time to consider whether he is ready to initiate a new therapy and will follow up after labs are completed.  Current Medications: none  Intolerances: Crestor  5 mg daily - myalgia  Risk Factors: CAD, OSA, Coronary calcium  score of 176,  LDL goal: <70 mg/dl Last lab 93/7974 TC 843, TG 187, HDL 44, LDL 80 while on Crestor  5 mg daily   Diet: low salt  and low fat diet -  Have alpha gal so no red meat  Likes lots vegetables  Drink: water, zero calories soft drinks  Snack: crackers    Exercise: walk 2 miles per day, elliptical 30 min every other day, resistance 2 times per week    Family History:   Relation Problem Comments  Mother Metallurgist) Hypertension     Father Metallurgist)   Sister (Alive)   Maternal Grandmother (Deceased)   Maternal Grandfather (Deceased)   Paternal Grandmother (Deceased)   Paternal Grandfather (Deceased) MI     Social History:  Alcohol: wine 1 glass every other day  Smoking: never   Labs:  Lipid Panel     Component Value Date/Time   CHOL 156 09/13/2023 1008   TRIG 187 (H) 09/13/2023 1008   HDL 44 09/13/2023 1008   CHOLHDL 3.5 09/13/2023 1008   LDLCALC 80 09/13/2023 1008   LABVLDL 32 09/13/2023 1008    Past Medical History:  Diagnosis Date   Allergy to alpha-gal    Hypertension    Impaired fasting glucose    Persistent atrial fibrillation (HCC) 07/10/2012   cardioversion x 2   Pneumonia    8th grade, without recurrence   Ureteral stone     Current Outpatient Medications on File Prior to Visit  Medication Sig Dispense Refill   amLODipine  (NORVASC ) 5 MG tablet TAKE 1 TABLET (5  MG TOTAL) BY MOUTH DAILY. 90 tablet 3   apixaban  (ELIQUIS ) 5 MG TABS tablet Take 1 tablet (5 mg total) by mouth 2 (two) times daily. 180 tablet 1   azelastine (OPTIVAR) 0.05 % ophthalmic solution Place 1 drop into both eyes 2 (two) times daily as needed (for eye irritation).     cetirizine (ZYRTEC) 10 MG tablet as needed for allergies or rhinitis.     Cholecalciferol (VITAMIN D3) 25 MCG (1000 UT) CAPS Take 2 capsules by mouth daily.     diltiazem  (CARDIZEM  CD) 120 MG 24 hr capsule Take 1 capsule (120 mg total) by mouth daily as needed. 180 capsule 3   diltiazem  (CARDIZEM  CD) 180 MG 24 hr capsule TAKE 1 CAPSULE BY MOUTH EVERY DAY 90 capsule 3   EPINEPHrine  0.3 mg/0.3 mL IJ SOAJ injection Inject 0.3 mg into the skin as  needed for allergies.     flecainide  (TAMBOCOR ) 150 MG tablet Take 2 tablets (300 mg total) by mouth as needed (Take 2 tablets as needed once daily for breakthrough afib). 60 tablet 11   fluticasone (FLONASE) 50 MCG/ACT nasal spray Place 1 spray into both nostrils daily as needed for allergies.     Glucosamine 500 MG CAPS Take 500 mg by mouth daily.     metoprolol  succinate (TOPROL -XL) 25 MG 24 hr tablet TAKE 1 TABLET (25 MG TOTAL) BY MOUTH DAILY AFTER SUPPER. 90 tablet 2   Multiple Vitamin (MULTIVITAMIN WITH MINERALS) TABS tablet Take 1 tablet by mouth daily.      vitamin C (ASCORBIC ACID) 500 MG tablet Take 500 mg by mouth daily.     Zinc 50 MG CAPS Take 1 tablet by mouth daily.     No current facility-administered medications on file prior to visit.    Allergies  Allergen Reactions   Losartan Swelling    Angioedema   Food     Alpha-Gal Syndrome--NO MAMMAL MEAT   Lisinopril Swelling    angioedema   Olmesartan  Medoxomil-Hctz     Other reaction(s): swelling    Assessment/Plan:  1. Hyperlipidemia -  Problem  Hyperlipidemia Ldl Goal <70   Current Medications: none  Intolerances: Crestor  5 mg daily - myalgia  Risk Factors: CAD, OSA, Coronary calcium  score of 176,  LDL goal: <70 mg/dl Last lab 93/7974 TC 843, TG 187, HDL 44, LDL 80 while on Crestor  5 mg daily     Hyperlipidemia LDL goal <70 Assessment:  LDL goal: < 70 mg/dl last LDLc 80 mg/dl TG 812 mg/dl (93/7974) while on Crestor  5 mg daily  Intolerance to moderate intensity statins - Crestor  - myalgia  Discussed next potential options (other statins, Zetia, PCSK-9 inhibitors, bempedoic acid and inclisiran); cost, dosing efficacy, side effects  Reiterated importance of heart healthy diet   Plan: Start taking atorvastatin  20 mg daily if not tolerated start taking 10 mg daily or even 10 mg every other day Pt to inform us  max tolerated dose of atorvastatin  in 4-6 weeks either via MyChart or Phone   Will get baseline Lipid  level before starting the treatment and once on stable treatment will repeat LFT and FLP in 2-3 months      Thank you,  Reginald [name], Pharm.D [name] [address], [place], [place] [address] Phone: [phone]; Fax: [phone]

## 2023-11-04 NOTE — Patient Instructions (Signed)
 Your Results:             Your most recent labs Goal  Total Cholesterol 156 < 200  Triglycerides 187 < 150  HDL (happy/good cholesterol) 44 > 40  LDL (lousy/bad cholesterol 80 < 70   Medication changes: start taking atorvastatin  20 mg daily if not tolerated start taking 10 mg daily or even 10 mg every other day. Phone follow up in 4 weeks   Lab orders: We want to repeat labs after 2-3 months.  We will send you a lab order to remind you once we get closer to that time.

## 2023-11-10 DIAGNOSIS — E785 Hyperlipidemia, unspecified: Secondary | ICD-10-CM | POA: Diagnosis not present

## 2023-11-11 ENCOUNTER — Ambulatory Visit: Payer: Self-pay | Admitting: Pharmacist

## 2023-11-11 LAB — LIPID PANEL
Chol/HDL Ratio: 4.1 ratio (ref 0.0–5.0)
Cholesterol, Total: 168 mg/dL (ref 100–199)
HDL: 41 mg/dL (ref >39)
LDL Chol Calc (NIH): 99 mg/dL (ref 0–99)
Triglycerides: 162 mg/dL — ABNORMAL HIGH (ref 0–149)
VLDL Cholesterol Cal: 28 mg/dL (ref 5–40)

## 2023-11-11 NOTE — Telephone Encounter (Signed)
 Per DPR LVM. Baseline LDL has not changed much in fact 19 points higher than last lab. Pt to continue taking atorvastatin  20 mg daily if not tolerated start taking 10 mg daily or even 10 mg every other day.  Pt to inform us  max tolerated dose of atorvastatin  in 4-6 weeks either via MyChart or Phone  - as per plan discussed at last OV on 11/04/2023

## 2023-12-20 DIAGNOSIS — M1712 Unilateral primary osteoarthritis, left knee: Secondary | ICD-10-CM | POA: Diagnosis not present

## 2024-01-05 DIAGNOSIS — D125 Benign neoplasm of sigmoid colon: Secondary | ICD-10-CM | POA: Diagnosis not present

## 2024-01-05 DIAGNOSIS — Z0000 Encounter for general adult medical examination without abnormal findings: Secondary | ICD-10-CM | POA: Diagnosis not present

## 2024-01-05 DIAGNOSIS — R7301 Impaired fasting glucose: Secondary | ICD-10-CM | POA: Diagnosis not present

## 2024-01-05 DIAGNOSIS — Z79899 Other long term (current) drug therapy: Secondary | ICD-10-CM | POA: Diagnosis not present

## 2024-01-05 DIAGNOSIS — G4733 Obstructive sleep apnea (adult) (pediatric): Secondary | ICD-10-CM | POA: Diagnosis not present

## 2024-01-05 DIAGNOSIS — E756 Lipid storage disorder, unspecified: Secondary | ICD-10-CM | POA: Diagnosis not present

## 2024-01-05 DIAGNOSIS — E781 Pure hyperglyceridemia: Secondary | ICD-10-CM | POA: Diagnosis not present

## 2024-01-05 DIAGNOSIS — Z7901 Long term (current) use of anticoagulants: Secondary | ICD-10-CM | POA: Diagnosis not present

## 2024-01-05 DIAGNOSIS — I10 Essential (primary) hypertension: Secondary | ICD-10-CM | POA: Diagnosis not present

## 2024-01-05 DIAGNOSIS — Z1389 Encounter for screening for other disorder: Secondary | ICD-10-CM | POA: Diagnosis not present

## 2024-01-05 DIAGNOSIS — I480 Paroxysmal atrial fibrillation: Secondary | ICD-10-CM | POA: Diagnosis not present

## 2024-01-05 DIAGNOSIS — R931 Abnormal findings on diagnostic imaging of heart and coronary circulation: Secondary | ICD-10-CM | POA: Diagnosis not present

## 2024-01-05 DIAGNOSIS — E559 Vitamin D deficiency, unspecified: Secondary | ICD-10-CM | POA: Diagnosis not present

## 2024-01-05 DIAGNOSIS — K5730 Diverticulosis of large intestine without perforation or abscess without bleeding: Secondary | ICD-10-CM | POA: Diagnosis not present

## 2024-01-05 DIAGNOSIS — Z23 Encounter for immunization: Secondary | ICD-10-CM | POA: Diagnosis not present

## 2024-01-07 DIAGNOSIS — E559 Vitamin D deficiency, unspecified: Secondary | ICD-10-CM | POA: Diagnosis not present

## 2024-01-07 DIAGNOSIS — R7301 Impaired fasting glucose: Secondary | ICD-10-CM | POA: Diagnosis not present

## 2024-01-07 DIAGNOSIS — Z125 Encounter for screening for malignant neoplasm of prostate: Secondary | ICD-10-CM | POA: Diagnosis not present

## 2024-01-07 DIAGNOSIS — Z79899 Other long term (current) drug therapy: Secondary | ICD-10-CM | POA: Diagnosis not present

## 2024-01-07 DIAGNOSIS — I10 Essential (primary) hypertension: Secondary | ICD-10-CM | POA: Diagnosis not present

## 2024-02-14 DIAGNOSIS — D696 Thrombocytopenia, unspecified: Secondary | ICD-10-CM | POA: Diagnosis not present

## 2024-04-09 ENCOUNTER — Other Ambulatory Visit: Payer: Self-pay | Admitting: Cardiology

## 2024-04-09 DIAGNOSIS — I480 Paroxysmal atrial fibrillation: Secondary | ICD-10-CM

## 2024-04-22 ENCOUNTER — Other Ambulatory Visit: Payer: Self-pay | Admitting: Cardiology

## 2024-07-07 ENCOUNTER — Ambulatory Visit: Admitting: Cardiology
